# Patient Record
Sex: Female | Born: 1935 | Race: White | Hispanic: No | Smoking: Never smoker
Health system: Southern US, Community
[De-identification: ages and names within clinical notes are randomized; demographics above are authoritative.]

## PROBLEM LIST (undated history)

## (undated) DIAGNOSIS — N189 Chronic kidney disease, unspecified: Secondary | ICD-10-CM

## (undated) DIAGNOSIS — I495 Sick sinus syndrome: Secondary | ICD-10-CM

## (undated) DIAGNOSIS — C7800 Secondary malignant neoplasm of unspecified lung: Secondary | ICD-10-CM

## (undated) DIAGNOSIS — C7951 Secondary malignant neoplasm of bone: Secondary | ICD-10-CM

## (undated) DIAGNOSIS — M1990 Unspecified osteoarthritis, unspecified site: Secondary | ICD-10-CM

## (undated) DIAGNOSIS — K5730 Diverticulosis of large intestine without perforation or abscess without bleeding: Secondary | ICD-10-CM

## (undated) DIAGNOSIS — C7931 Secondary malignant neoplasm of brain: Secondary | ICD-10-CM

## (undated) DIAGNOSIS — M109 Gout, unspecified: Secondary | ICD-10-CM

## (undated) DIAGNOSIS — I10 Essential (primary) hypertension: Secondary | ICD-10-CM

## (undated) DIAGNOSIS — Z923 Personal history of irradiation: Secondary | ICD-10-CM

## (undated) DIAGNOSIS — R42 Dizziness and giddiness: Secondary | ICD-10-CM

## (undated) DIAGNOSIS — B9620 Unspecified Escherichia coli [E. coli] as the cause of diseases classified elsewhere: Secondary | ICD-10-CM

## (undated) DIAGNOSIS — Z7189 Other specified counseling: Secondary | ICD-10-CM

## (undated) DIAGNOSIS — N390 Urinary tract infection, site not specified: Secondary | ICD-10-CM

## (undated) DIAGNOSIS — E785 Hyperlipidemia, unspecified: Secondary | ICD-10-CM

## (undated) DIAGNOSIS — C649 Malignant neoplasm of unspecified kidney, except renal pelvis: Secondary | ICD-10-CM

## (undated) DIAGNOSIS — I2510 Atherosclerotic heart disease of native coronary artery without angina pectoris: Secondary | ICD-10-CM

## (undated) DIAGNOSIS — K635 Polyp of colon: Secondary | ICD-10-CM

## (undated) DIAGNOSIS — I219 Acute myocardial infarction, unspecified: Secondary | ICD-10-CM

## (undated) HISTORY — DX: Urinary tract infection, site not specified: N39.0

## (undated) HISTORY — DX: Hyperlipidemia, unspecified: E78.5

## (undated) HISTORY — DX: Other specified counseling: Z71.89

## (undated) HISTORY — PX: COLONOSCOPY: SHX174

## (undated) HISTORY — DX: Dizziness and giddiness: R42

## (undated) HISTORY — DX: Malignant neoplasm of unspecified kidney, except renal pelvis: C64.9

## (undated) HISTORY — DX: Secondary malignant neoplasm of bone: C79.51

## (undated) HISTORY — DX: Secondary malignant neoplasm of unspecified lung: C78.00

## (undated) HISTORY — PX: OOPHORECTOMY: SHX86

## (undated) HISTORY — DX: Atherosclerotic heart disease of native coronary artery without angina pectoris: I25.10

## (undated) HISTORY — DX: Unspecified Escherichia coli (E. coli) as the cause of diseases classified elsewhere: B96.20

## (undated) HISTORY — DX: Polyp of colon: K63.5

## (undated) HISTORY — PX: APPENDECTOMY: SHX54

## (undated) HISTORY — DX: Sick sinus syndrome: I49.5

## (undated) HISTORY — PX: BREAST BIOPSY: SHX20

## (undated) HISTORY — DX: Acute myocardial infarction, unspecified: I21.9

## (undated) HISTORY — PX: ABDOMINAL HYSTERECTOMY: SHX81

## (undated) HISTORY — DX: Chronic kidney disease, unspecified: N18.9

## (undated) HISTORY — DX: Unspecified osteoarthritis, unspecified site: M19.90

## (undated) HISTORY — PX: TONSILLECTOMY: SUR1361

## (undated) HISTORY — DX: Secondary malignant neoplasm of brain: C79.31

## (undated) HISTORY — PX: CHOLECYSTECTOMY: SHX55

## (undated) HISTORY — DX: Diverticulosis of large intestine without perforation or abscess without bleeding: K57.30

## (undated) HISTORY — DX: Gout, unspecified: M10.9

## (undated) HISTORY — DX: Essential (primary) hypertension: I10

---

## 1998-01-21 ENCOUNTER — Other Ambulatory Visit: Admission: RE | Admit: 1998-01-21 | Discharge: 1998-01-21 | Payer: Self-pay | Admitting: *Deleted

## 1999-01-17 ENCOUNTER — Other Ambulatory Visit: Admission: RE | Admit: 1999-01-17 | Discharge: 1999-01-17 | Payer: Self-pay | Admitting: *Deleted

## 1999-05-25 ENCOUNTER — Inpatient Hospital Stay (HOSPITAL_COMMUNITY): Admission: EM | Admit: 1999-05-25 | Discharge: 1999-05-27 | Payer: Self-pay | Admitting: Internal Medicine

## 1999-07-25 ENCOUNTER — Encounter (INDEPENDENT_AMBULATORY_CARE_PROVIDER_SITE_OTHER): Payer: Self-pay

## 1999-07-25 ENCOUNTER — Ambulatory Visit (HOSPITAL_COMMUNITY): Admission: RE | Admit: 1999-07-25 | Discharge: 1999-07-25 | Payer: Self-pay | Admitting: Internal Medicine

## 2000-03-12 ENCOUNTER — Ambulatory Visit (HOSPITAL_COMMUNITY): Admission: RE | Admit: 2000-03-12 | Discharge: 2000-03-12 | Payer: Self-pay | Admitting: Orthopedic Surgery

## 2000-04-22 ENCOUNTER — Encounter: Admission: RE | Admit: 2000-04-22 | Discharge: 2000-04-22 | Payer: Self-pay | Admitting: Internal Medicine

## 2000-04-22 ENCOUNTER — Encounter: Payer: Self-pay | Admitting: Internal Medicine

## 2001-03-11 ENCOUNTER — Emergency Department (HOSPITAL_COMMUNITY): Admission: EM | Admit: 2001-03-11 | Discharge: 2001-03-11 | Payer: Self-pay | Admitting: Emergency Medicine

## 2001-05-08 ENCOUNTER — Encounter: Payer: Self-pay | Admitting: Internal Medicine

## 2001-05-08 ENCOUNTER — Encounter: Admission: RE | Admit: 2001-05-08 | Discharge: 2001-05-08 | Payer: Self-pay | Admitting: Internal Medicine

## 2002-05-19 ENCOUNTER — Encounter: Payer: Self-pay | Admitting: Internal Medicine

## 2002-05-19 ENCOUNTER — Encounter: Admission: RE | Admit: 2002-05-19 | Discharge: 2002-05-19 | Payer: Self-pay | Admitting: *Deleted

## 2003-06-01 ENCOUNTER — Encounter: Admission: RE | Admit: 2003-06-01 | Discharge: 2003-06-01 | Payer: Self-pay | Admitting: Internal Medicine

## 2003-06-01 ENCOUNTER — Encounter: Payer: Self-pay | Admitting: Internal Medicine

## 2003-11-20 LAB — HM COLONOSCOPY

## 2004-02-22 ENCOUNTER — Other Ambulatory Visit: Admission: RE | Admit: 2004-02-22 | Discharge: 2004-02-22 | Payer: Self-pay | Admitting: *Deleted

## 2005-01-05 ENCOUNTER — Ambulatory Visit: Payer: Self-pay | Admitting: Internal Medicine

## 2005-01-30 ENCOUNTER — Ambulatory Visit: Payer: Self-pay | Admitting: Internal Medicine

## 2005-05-30 ENCOUNTER — Ambulatory Visit: Payer: Self-pay | Admitting: Internal Medicine

## 2005-12-18 ENCOUNTER — Ambulatory Visit: Payer: Self-pay | Admitting: Internal Medicine

## 2006-01-23 ENCOUNTER — Ambulatory Visit: Payer: Self-pay | Admitting: Internal Medicine

## 2006-01-25 ENCOUNTER — Ambulatory Visit: Payer: Self-pay | Admitting: Internal Medicine

## 2006-08-22 ENCOUNTER — Ambulatory Visit: Payer: Self-pay | Admitting: Internal Medicine

## 2006-09-03 ENCOUNTER — Encounter: Admission: RE | Admit: 2006-09-03 | Discharge: 2006-09-03 | Payer: Self-pay | Admitting: Internal Medicine

## 2006-11-15 ENCOUNTER — Ambulatory Visit: Payer: Self-pay | Admitting: Internal Medicine

## 2007-01-08 ENCOUNTER — Ambulatory Visit: Payer: Self-pay | Admitting: Internal Medicine

## 2007-01-08 LAB — CONVERTED CEMR LAB
AST: 20 units/L (ref 0–37)
Cholesterol: 150 mg/dL (ref 0–200)
Creatinine, Ser: 1.3 mg/dL — ABNORMAL HIGH (ref 0.4–1.2)
Potassium: 3.8 meq/L (ref 3.5–5.1)
Total CHOL/HDL Ratio: 3.4

## 2007-02-20 ENCOUNTER — Ambulatory Visit: Payer: Self-pay | Admitting: Internal Medicine

## 2007-07-01 ENCOUNTER — Ambulatory Visit: Payer: Self-pay | Admitting: Internal Medicine

## 2007-07-09 LAB — CONVERTED CEMR LAB
ALT: 17 units/L (ref 0–35)
Creatinine, Ser: 1.2 mg/dL (ref 0.4–1.2)
Direct LDL: 82.7 mg/dL
HDL: 36.4 mg/dL — ABNORMAL LOW (ref >39.0)
Potassium: 4.1 meq/L (ref 3.5–5.1)
Triglycerides: 210 mg/dL (ref 0–149)

## 2007-07-10 ENCOUNTER — Encounter (INDEPENDENT_AMBULATORY_CARE_PROVIDER_SITE_OTHER): Payer: Self-pay | Admitting: *Deleted

## 2007-07-11 ENCOUNTER — Ambulatory Visit: Payer: Self-pay | Admitting: Internal Medicine

## 2007-07-11 LAB — CONVERTED CEMR LAB: Cholesterol, target level: 200 mg/dL

## 2007-07-19 LAB — CONVERTED CEMR LAB
Basophils Absolute: 0 K/uL
Basophils Relative: 0.2 %
Eosinophils Absolute: 0.1 K/uL
Eosinophils Relative: 1.6 %
HCT: 39.1 %
Hemoglobin: 13.3 g/dL
Lymphocytes Relative: 22.3 %
MCHC: 34 g/dL
MCV: 86 fL
Monocytes Absolute: 0.4 K/uL
Monocytes Relative: 6.4 %
Neutro Abs: 4 K/uL
Neutrophils Relative %: 69.5 %
Platelets: 235 K/uL
RBC: 4.54 M/uL
RDW: 12.9 %
TSH: 2.47 u[IU]/mL
WBC: 5.8 10*3/microliter

## 2007-07-22 ENCOUNTER — Encounter (INDEPENDENT_AMBULATORY_CARE_PROVIDER_SITE_OTHER): Payer: Self-pay | Admitting: *Deleted

## 2007-09-05 ENCOUNTER — Encounter: Admission: RE | Admit: 2007-09-05 | Discharge: 2007-09-05 | Payer: Self-pay | Admitting: Internal Medicine

## 2007-11-20 HISTORY — PX: BREAST LUMPECTOMY: SHX2

## 2007-12-24 ENCOUNTER — Ambulatory Visit: Payer: Self-pay | Admitting: Internal Medicine

## 2007-12-24 DIAGNOSIS — I10 Essential (primary) hypertension: Secondary | ICD-10-CM | POA: Insufficient documentation

## 2007-12-24 DIAGNOSIS — N6019 Diffuse cystic mastopathy of unspecified breast: Secondary | ICD-10-CM | POA: Insufficient documentation

## 2007-12-24 DIAGNOSIS — E785 Hyperlipidemia, unspecified: Secondary | ICD-10-CM | POA: Insufficient documentation

## 2007-12-24 DIAGNOSIS — D696 Thrombocytopenia, unspecified: Secondary | ICD-10-CM | POA: Insufficient documentation

## 2007-12-29 ENCOUNTER — Encounter (INDEPENDENT_AMBULATORY_CARE_PROVIDER_SITE_OTHER): Payer: Self-pay | Admitting: *Deleted

## 2007-12-29 ENCOUNTER — Encounter: Admission: RE | Admit: 2007-12-29 | Discharge: 2007-12-29 | Payer: Self-pay | Admitting: Internal Medicine

## 2007-12-29 LAB — CONVERTED CEMR LAB
AST: 20 units/L (ref 0–37)
Albumin: 4.3 g/dL (ref 3.5–5.2)
Bilirubin, Direct: 0.2 mg/dL (ref 0.0–0.3)
Creatinine, Ser: 1.5 mg/dL — ABNORMAL HIGH (ref 0.4–1.2)
Eosinophils Absolute: 0.2 10*3/uL (ref 0.0–0.6)
Eosinophils Relative: 3.7 % (ref 0.0–5.0)
Lymphocytes Relative: 31.5 % (ref 12.0–46.0)
MCHC: 33.7 g/dL (ref 30.0–36.0)
Neutro Abs: 3 10*3/uL (ref 1.4–7.7)
Neutrophils Relative %: 59 % (ref 43.0–77.0)
Platelets: 215 10*3/uL (ref 150–400)
Potassium: 5.1 meq/L (ref 3.5–5.1)
Prolactin: 6.2 ng/mL
RBC: 4.57 M/uL (ref 3.87–5.11)
WBC: 5 10*3/uL (ref 4.5–10.5)

## 2007-12-31 ENCOUNTER — Encounter (INDEPENDENT_AMBULATORY_CARE_PROVIDER_SITE_OTHER): Payer: Self-pay | Admitting: *Deleted

## 2008-02-26 ENCOUNTER — Encounter: Payer: Self-pay | Admitting: Internal Medicine

## 2008-03-02 ENCOUNTER — Encounter: Payer: Self-pay | Admitting: Internal Medicine

## 2008-03-23 ENCOUNTER — Encounter: Admission: RE | Admit: 2008-03-23 | Discharge: 2008-03-23 | Payer: Self-pay | Admitting: Surgery

## 2008-03-25 ENCOUNTER — Ambulatory Visit (HOSPITAL_BASED_OUTPATIENT_CLINIC_OR_DEPARTMENT_OTHER): Admission: RE | Admit: 2008-03-25 | Discharge: 2008-03-25 | Payer: Self-pay | Admitting: Surgery

## 2008-03-25 ENCOUNTER — Encounter (INDEPENDENT_AMBULATORY_CARE_PROVIDER_SITE_OTHER): Payer: Self-pay | Admitting: Surgery

## 2008-03-30 ENCOUNTER — Encounter: Payer: Self-pay | Admitting: Internal Medicine

## 2008-04-08 ENCOUNTER — Encounter: Payer: Self-pay | Admitting: Internal Medicine

## 2008-06-30 ENCOUNTER — Ambulatory Visit: Payer: Self-pay | Admitting: Internal Medicine

## 2008-07-04 LAB — CONVERTED CEMR LAB: Creatinine, Ser: 1.3 mg/dL — ABNORMAL HIGH (ref 0.4–1.2)

## 2008-07-05 ENCOUNTER — Encounter (INDEPENDENT_AMBULATORY_CARE_PROVIDER_SITE_OTHER): Payer: Self-pay | Admitting: *Deleted

## 2008-07-06 ENCOUNTER — Telehealth (INDEPENDENT_AMBULATORY_CARE_PROVIDER_SITE_OTHER): Payer: Self-pay | Admitting: *Deleted

## 2008-07-08 ENCOUNTER — Encounter: Payer: Self-pay | Admitting: Internal Medicine

## 2008-07-14 ENCOUNTER — Encounter: Payer: Self-pay | Admitting: Internal Medicine

## 2008-07-14 ENCOUNTER — Ambulatory Visit: Payer: Self-pay | Admitting: Vascular Surgery

## 2008-07-16 ENCOUNTER — Encounter: Payer: Self-pay | Admitting: Internal Medicine

## 2008-07-23 ENCOUNTER — Encounter: Payer: Self-pay | Admitting: Internal Medicine

## 2008-09-16 ENCOUNTER — Encounter: Admission: RE | Admit: 2008-09-16 | Discharge: 2008-09-16 | Payer: Self-pay | Admitting: Internal Medicine

## 2008-12-30 ENCOUNTER — Ambulatory Visit: Payer: Self-pay | Admitting: Internal Medicine

## 2008-12-30 DIAGNOSIS — H8110 Benign paroxysmal vertigo, unspecified ear: Secondary | ICD-10-CM | POA: Insufficient documentation

## 2008-12-30 DIAGNOSIS — I498 Other specified cardiac arrhythmias: Secondary | ICD-10-CM

## 2008-12-30 DIAGNOSIS — K5730 Diverticulosis of large intestine without perforation or abscess without bleeding: Secondary | ICD-10-CM | POA: Insufficient documentation

## 2008-12-30 DIAGNOSIS — Z860100 Personal history of colon polyps, unspecified: Secondary | ICD-10-CM | POA: Insufficient documentation

## 2008-12-30 DIAGNOSIS — M1990 Unspecified osteoarthritis, unspecified site: Secondary | ICD-10-CM | POA: Insufficient documentation

## 2009-01-02 LAB — CONVERTED CEMR LAB
Alkaline Phosphatase: 71 units/L (ref 39–117)
BUN: 27 mg/dL — ABNORMAL HIGH (ref 6–23)
Bilirubin, Direct: 0.1 mg/dL (ref 0.0–0.3)
Chloride: 100 meq/L (ref 96–112)
Cholesterol: 176 mg/dL (ref 0–200)
Direct LDL: 94.8 mg/dL
Eosinophils Absolute: 0.1 10*3/uL (ref 0.0–0.7)
GFR calc non Af Amer: 38 mL/min
Glucose, Bld: 97 mg/dL (ref 70–99)
HDL: 38.2 mg/dL — ABNORMAL LOW (ref >39.0)
Lymphocytes Relative: 29.4 % (ref 12.0–46.0)
Monocytes Absolute: 0.3 10*3/uL (ref 0.1–1.0)
Monocytes Relative: 6.3 % (ref 3.0–12.0)
Neutro Abs: 2.6 10*3/uL (ref 1.4–7.7)
Neutrophils Relative %: 60.5 % (ref 43.0–77.0)
Potassium: 3.8 meq/L (ref 3.5–5.1)
RBC: 4.27 M/uL (ref 3.87–5.11)
TSH: 3.31 microintl units/mL (ref 0.35–5.50)
Total CHOL/HDL Ratio: 4.6
Triglycerides: 221 mg/dL (ref 0–149)
VLDL: 44 mg/dL — ABNORMAL HIGH (ref 0–40)
WBC: 4.3 10*3/uL — ABNORMAL LOW (ref 4.5–10.5)

## 2009-01-03 ENCOUNTER — Encounter (INDEPENDENT_AMBULATORY_CARE_PROVIDER_SITE_OTHER): Payer: Self-pay | Admitting: *Deleted

## 2009-01-05 ENCOUNTER — Encounter: Payer: Self-pay | Admitting: Internal Medicine

## 2009-01-18 ENCOUNTER — Encounter (INDEPENDENT_AMBULATORY_CARE_PROVIDER_SITE_OTHER): Payer: Self-pay | Admitting: *Deleted

## 2009-01-18 ENCOUNTER — Ambulatory Visit: Payer: Self-pay | Admitting: Internal Medicine

## 2009-01-18 LAB — CONVERTED CEMR LAB
OCCULT 1: NEGATIVE
OCCULT 3: NEGATIVE

## 2009-04-27 ENCOUNTER — Ambulatory Visit: Payer: Self-pay | Admitting: Internal Medicine

## 2009-05-03 ENCOUNTER — Encounter (INDEPENDENT_AMBULATORY_CARE_PROVIDER_SITE_OTHER): Payer: Self-pay | Admitting: *Deleted

## 2009-05-03 LAB — CONVERTED CEMR LAB
Direct LDL: 91.8 mg/dL
Potassium: 4.3 meq/L (ref 3.5–5.1)
VLDL: 51.6 mg/dL — ABNORMAL HIGH (ref 0.0–40.0)

## 2009-06-07 ENCOUNTER — Telehealth (INDEPENDENT_AMBULATORY_CARE_PROVIDER_SITE_OTHER): Payer: Self-pay | Admitting: *Deleted

## 2009-06-08 ENCOUNTER — Ambulatory Visit: Payer: Self-pay | Admitting: Internal Medicine

## 2009-06-08 DIAGNOSIS — N182 Chronic kidney disease, stage 2 (mild): Secondary | ICD-10-CM

## 2009-06-08 LAB — CONVERTED CEMR LAB
Bilirubin Urine: NEGATIVE
Blood in Urine, dipstick: NEGATIVE
Nitrite: NEGATIVE
Specific Gravity, Urine: <1.005
Urobilinogen, UA: 0.2
pH: 5

## 2009-06-09 ENCOUNTER — Encounter: Payer: Self-pay | Admitting: Internal Medicine

## 2009-06-10 ENCOUNTER — Telehealth (INDEPENDENT_AMBULATORY_CARE_PROVIDER_SITE_OTHER): Payer: Self-pay | Admitting: *Deleted

## 2009-06-14 ENCOUNTER — Telehealth (INDEPENDENT_AMBULATORY_CARE_PROVIDER_SITE_OTHER): Payer: Self-pay | Admitting: *Deleted

## 2009-07-05 ENCOUNTER — Encounter: Payer: Self-pay | Admitting: Internal Medicine

## 2009-10-07 ENCOUNTER — Ambulatory Visit: Payer: Self-pay | Admitting: Internal Medicine

## 2009-10-09 LAB — CONVERTED CEMR LAB
BUN: 25 mg/dL — ABNORMAL HIGH (ref 6–23)
Cholesterol: 228 mg/dL — ABNORMAL HIGH (ref 0–200)
Creatinine, Ser: 1.5 mg/dL — ABNORMAL HIGH (ref 0.4–1.2)
Direct LDL: 136.7 mg/dL
HDL: 34.9 mg/dL — ABNORMAL LOW (ref >39.00)
Potassium: 4.2 meq/L (ref 3.5–5.1)

## 2009-10-10 ENCOUNTER — Encounter (INDEPENDENT_AMBULATORY_CARE_PROVIDER_SITE_OTHER): Payer: Self-pay | Admitting: *Deleted

## 2009-10-18 ENCOUNTER — Telehealth (INDEPENDENT_AMBULATORY_CARE_PROVIDER_SITE_OTHER): Payer: Self-pay | Admitting: *Deleted

## 2009-10-18 ENCOUNTER — Ambulatory Visit: Payer: Self-pay | Admitting: Internal Medicine

## 2009-10-20 ENCOUNTER — Encounter: Admission: RE | Admit: 2009-10-20 | Discharge: 2009-10-20 | Payer: Self-pay | Admitting: Internal Medicine

## 2009-11-19 DIAGNOSIS — N189 Chronic kidney disease, unspecified: Secondary | ICD-10-CM

## 2009-11-19 HISTORY — PX: PACEMAKER INSERTION: SHX728

## 2009-11-19 HISTORY — DX: Chronic kidney disease, unspecified: N18.9

## 2009-11-22 ENCOUNTER — Ambulatory Visit: Payer: Self-pay | Admitting: Internal Medicine

## 2009-11-22 ENCOUNTER — Telehealth: Payer: Self-pay | Admitting: Internal Medicine

## 2009-11-22 DIAGNOSIS — R42 Dizziness and giddiness: Secondary | ICD-10-CM

## 2009-12-06 ENCOUNTER — Encounter: Payer: Self-pay | Admitting: Internal Medicine

## 2010-01-05 ENCOUNTER — Encounter: Payer: Self-pay | Admitting: Internal Medicine

## 2010-02-14 ENCOUNTER — Ambulatory Visit: Payer: Self-pay | Admitting: Internal Medicine

## 2010-03-28 ENCOUNTER — Telehealth (INDEPENDENT_AMBULATORY_CARE_PROVIDER_SITE_OTHER): Payer: Self-pay | Admitting: *Deleted

## 2010-04-04 ENCOUNTER — Telehealth (INDEPENDENT_AMBULATORY_CARE_PROVIDER_SITE_OTHER): Payer: Self-pay | Admitting: *Deleted

## 2010-04-06 ENCOUNTER — Encounter: Payer: Self-pay | Admitting: Internal Medicine

## 2010-04-19 HISTORY — PX: OTHER SURGICAL HISTORY: SHX169

## 2010-04-22 ENCOUNTER — Inpatient Hospital Stay (HOSPITAL_COMMUNITY): Admission: EM | Admit: 2010-04-22 | Discharge: 2010-04-26 | Payer: Self-pay | Admitting: Emergency Medicine

## 2010-04-22 ENCOUNTER — Ambulatory Visit: Payer: Self-pay | Admitting: Cardiovascular Disease

## 2010-04-24 ENCOUNTER — Encounter: Payer: Self-pay | Admitting: Cardiology

## 2010-06-27 ENCOUNTER — Ambulatory Visit: Payer: Self-pay | Admitting: Internal Medicine

## 2010-07-03 LAB — CONVERTED CEMR LAB
BUN: 33 mg/dL — ABNORMAL HIGH (ref 6–23)
Direct LDL: 82.7 mg/dL
Potassium: 4.1 meq/L (ref 3.5–5.1)

## 2010-07-04 ENCOUNTER — Ambulatory Visit: Payer: Self-pay | Admitting: Internal Medicine

## 2010-07-04 DIAGNOSIS — W57XXXA Bitten or stung by nonvenomous insect and other nonvenomous arthropods, initial encounter: Secondary | ICD-10-CM | POA: Insufficient documentation

## 2010-07-04 DIAGNOSIS — S90569A Insect bite (nonvenomous), unspecified ankle, initial encounter: Secondary | ICD-10-CM

## 2010-07-12 ENCOUNTER — Ambulatory Visit: Payer: Self-pay | Admitting: Cardiology

## 2010-10-06 ENCOUNTER — Ambulatory Visit: Payer: Self-pay | Admitting: Family Medicine

## 2010-10-06 DIAGNOSIS — R21 Rash and other nonspecific skin eruption: Secondary | ICD-10-CM

## 2010-10-17 ENCOUNTER — Telehealth: Payer: Self-pay | Admitting: Internal Medicine

## 2010-10-17 ENCOUNTER — Ambulatory Visit: Payer: Self-pay | Admitting: Cardiology

## 2010-10-17 DIAGNOSIS — M25559 Pain in unspecified hip: Secondary | ICD-10-CM

## 2010-11-19 DIAGNOSIS — I219 Acute myocardial infarction, unspecified: Secondary | ICD-10-CM

## 2010-11-19 HISTORY — DX: Acute myocardial infarction, unspecified: I21.9

## 2010-11-21 ENCOUNTER — Encounter
Admission: RE | Admit: 2010-11-21 | Discharge: 2010-11-21 | Payer: Self-pay | Source: Home / Self Care | Attending: Internal Medicine | Admitting: Internal Medicine

## 2010-12-19 NOTE — Assessment & Plan Note (Signed)
Summary: 4 mth fu/ns/kdc   Vital Signs:  Patient profile:   77 year old female Weight:      180.6 pounds Pulse rate:   60 / minute Resp:     17 per minute BP sitting:   110 / 62  (left arm) Cuff size:   large  Vitals Entered By: Shonna Chock (February 14, 2010 10:23 AM) CC: 4 Month Follow-up Comments REVIEWED MED LIST, PATIENT AGREED DOSE AND INSTRUCTION CORRECT    CC:  4 Month Follow-up.  History of Present Illness: Brooke Rios is asymptomatic; Dr Elvis Coil 11/2009  evaluation of her bradycardia reviewed. Labs reviewed ; "I love sweets".  Allergies: 1)  ! Codeine 2)  ! * Tiazac 3)  ! * Avapro 4)  ! Procardia 5)  ! Norvasc  Review of Systems Eyes:  Denies double vision and vision loss-both eyes. CV:  Denies lightheadness and near fainting. Neuro:  Complains of numbness; denies brief paralysis, tingling, and weakness; Occasional am numbness in extremities, ? positional.  Physical Exam  General:  well-nourished; alert,appropriate and cooperative throughout examination Lungs:  Normal respiratory effort, chest expands symmetrically. Lungs are clear to auscultation, no crackles or wheezes. Heart:  normal rate, regular rhythm, no gallop, no rub, no JVD, no HJR, and grade 1 /6 systolic murmur.   Abdomen:  Bowel sounds positive,abdomen soft and non-tender without masses, organomegaly or hernias noted. Dullness LUQ Pulses:  R and L carotid,radial,dorsalis pedis and posterior tibial pulses are full and equal bilaterally Extremities:  No clubbing, cyanosis, edema. Neurologic:  alert & oriented X3, strength normal in all extremities, and DTRs symmetrical  but 0-1/2 + Psych:  memory intact for recent and remote, normally interactive, and good eye contact.     Impression & Recommendations:  Problem # 1:  BRADYCARDIA, CHRONIC (ICD-427.89) asymptomatic  Problem # 2:  RENAL DISEASE, CHRONIC, MILD (ICD-585.2)  Problem # 3:  HYPERLIPIDEMIA (ICD-272.4)  Complete Medication List: 1)   Benazepril Hcl 40 Mg Tabs (Benazepril hcl) .... Take 1 by mouth everyday 2)  Hydrochlorothiazide 12.5 Mg Tabs (hydrochlorothiazide)  .Marland Kitchen.. 1 qd 3)  Fish Oil 1000 Mg Caps (Omega-3 fatty acids) .Marland Kitchen.. 1 by mouth once daily 4)  Miralax   Patient Instructions: 1)  Please schedule a follow-up appointment in 4 months. Consume < 40 grams of "sugar" from HFCS sources as discussed. 2)  BUN,creat,K+ prior to visit, ICD-9:401.9 3)  Lipid Panel prior to visit, ICD-9:277.7 4)  HbgA1C prior to visit, ICD-9:277.7

## 2010-12-19 NOTE — Assessment & Plan Note (Signed)
Summary: light head, elevated Bp//fd   Vital Signs:  Patient profile:   77 year old female Height:      66.5 inches Weight:      185 pounds O2 Sat:      97 % Temp:     98.7 degrees F oral Pulse rate:   48 / minute BP sitting:   130 / 68  (left arm)  Vitals Entered By: Jeremy Johann CMA (November 22, 2009 12:48 PM) CC: lightheaded, elevated BP Comments REVIEWED MED LIST, PATIENT AGREED DOSE AND INSTRUCTION CORRECT    CC:  lightheaded and elevated BP.  History of Present Illness: Lightheadedness , hot flash  with flushing & weak intermittently for 2-3 months, progressive since 11/12/2009. Severe spell 11/21/2009. BP has ranged from 158/59-209/68. Spells last 2 minutes ; no trigger . No associated chest pain,rhythm change, headache, abdominal pain or diarrhea. Dr Elvis Coil 06/2009 reviewed ; "asymptomatic bradycardia"  to be monitored annually.  Allergies: 1)  ! Codeine 2)  ! * Tiazac 3)  ! * Avapro 4)  ! Procardia 5)  ! Norvasc  Review of Systems General:  Denies chills, fever, and sweats. Eyes:  Denies blurring, double vision, and vision loss-both eyes. CV:  Denies chest pain or discomfort, palpitations, and shortness of breath with exertion. Derm:  Complains of changes in color of skin; denies lesion(s) and rash. Neuro:  Denies brief paralysis, disturbances in coordination, headaches, numbness, poor balance, sensation of room spinning, tingling, visual disturbances, and weakness.  Physical Exam  General:  well-nourished,in no acute distress; alert,appropriate and cooperative throughout examination Eyes:  No corneal or conjunctival inflammation noted. EOMI. Perrla. Field of  Vision grossly normal. Lungs:  Normal respiratory effort, chest expands symmetrically. Lungs are clear to auscultation, no crackles or wheezes. Heart:  regular rhythm, bradycardia, and grade1.5   /6 systolic murmur with neck radiation.   Neurologic:  alert & oriented X3, cranial nerves II-XII intact,  strength normal in all extremities, sensation intact to light touch, gait normal, DTRs symmetrical and normal, and Romberg negative except slight tremor RUE > LUE.   Skin:  Intact without suspicious lesions or rashes Cervical Nodes:  No lymphadenopathy noted Axillary Nodes:  No palpable lymphadenopathy Psych:  memory intact for recent and remote, normally interactive, and good eye contact.     Impression & Recommendations:  Problem # 1:  DIZZINESS (ICD-780.4)  Paroxysmal lightheadedness  Orders: EKG w/ Interpretation (93000) Cardiology Referral (Cardiology)  Problem # 2:  HYPERTENSION (ICD-401.9)  uncotrolled as per her cuff Her updated medication list for this problem includes:    Benazepril Hcl 40 Mg Tabs (Benazepril hcl) .Marland Kitchen... Take 1 by mouth everyday  Orders: EKG w/ Interpretation (93000) Cardiology Referral (Cardiology)  Problem # 3:  BRADYCARDIA, CHRONIC (ICD-427.89)  ? new ectopic P wave vs 12/30/2008; ? need for Event Recorder  Orders: EKG w/ Interpretation (93000) Cardiology Referral (Cardiology)  Complete Medication List: 1)  Benazepril Hcl 40 Mg Tabs (Benazepril hcl) .... Take 1 by mouth everyday 2)  Hydrochlorothiazide 12.5 Mg Tabs (hydrochlorothiazide)  .Marland Kitchen.. 1 qd 3)  Fish Oil 1000 Mg Caps (Omega-3 fatty acids) .Marland Kitchen.. 1 by mouth once daily 4)  Miralax   Patient Instructions: 1)  To ER if symptoms persist or progress.

## 2010-12-19 NOTE — Assessment & Plan Note (Signed)
Summary: WOUND ON BACK OF LEG/WALKIN HOPPER PT/KN   Vital Signs:  Patient profile:   77 year old female Weight:      186 pounds Temp:     97.6 degrees F oral BP sitting:   120 / 80  (left arm)  Vitals Entered By: Doristine Devoid CMA (October 06, 2010 1:47 PM) CC: L leg rash x1 month not healing and very itchy   History of Present Illness: 77 yo woman here today for L leg wound.  pt reports it was previously a mole and she 'scratched it off w/ her toenail' 1 month ago.  it then became red circumferentially around this area.  diameter was previously larger but has shrunk.  now area is terribly 'itchy'.  has been using Neosporin and [facility].  currently on Pradaxa.  Current Medications (verified): 1)  Benazepril Hcl 20 Mg Tabs (Benazepril Hcl) .Marland Kitchen.. 1 Tablet By Mouth Everyday 2)  Hydrochlorothiazide 12.5 Mg Tabs (Hydrochlorothiazide) .... Take One Tablet Daily 3)  Fish Oil 1000 Mg Caps (Omega-3 Fatty Acids) .Marland Kitchen.. 1 By Mouth Once Daily 4)  Miralax 5)  Imdur 30 Mg Xr24h-Tab (Isosorbide Mononitrate) .Marland Kitchen.. 1 Tablet By Mouth Once Daily 6)  Metoprolol Succinate 50 Mg Xr24h-Tab (Metoprolol Succinate) .Marland Kitchen.. 1 Tablet By Mouth Once Daily 7)  Nitroglycerin 0.4 Mg/hr Pt24 (Nitroglycerin) .... As Needed 8)  Simvastatin 40 Mg Tabs (Simvastatin) .Marland Kitchen.. 1 Tablet By Mouth Once Daily 9)  Tramadol Hcl 50 Mg Tabs (Tramadol Hcl) .Marland Kitchen.. 1 Tablet Every 6 Hours As Needed For Pain 10)  Miralax  Powd (Polyethylene Glycol 3350) .Marland KitchenMarland KitchenMarland Kitchen 17 Grams By Mouth Weekly As Needed 11)  Pradaxa 75 Mg Caps (Dabigatran Etexilate Mesylate) .... Per Cards  Allergies (verified): 1)  ! Codeine 2)  ! * Tiazac 3)  ! * Avapro 4)  ! Procardia 5)  ! Norvasc  Review of Systems      See HPI  Physical Exam  General:  well-nourished; alert,appropriate and cooperative throughout examination Skin:  4 cm diameter area of blanching erythema on L medial leg, just below tibial plateau.  + excoriations.  not indurated or warm to  touch.   Impression & Recommendations:  Problem # 1:  RASH-NONVESICULAR (ICD-782.1) Assessment New  unclear as to what started process on pt's leg but at this time appears to be red, irritated and pt reports very itchy.  start steroid cream.  advised her to stop neosporin as she may be reacting to this.  no evidence of infxn.  reviewed supportive care and red flags that should prompt return.  Pt expresses understanding and is in agreement w/ this plan. Her updated medication list for this problem includes:    Triamcinolone Acetonide 0.1 % Oint (Triamcinolone acetonide) .Marland Kitchen... Apply to affected area two times a day.  disp 1 large tube  Orders: Prescription Created Electronically [phone])  Complete Medication List: 1)  Benazepril Hcl 20 Mg Tabs (Benazepril hcl) .Marland Kitchen.. 1 tablet by mouth everyday 2)  Hydrochlorothiazide 12.5 Mg Tabs (Hydrochlorothiazide) .... Take one tablet daily 3)  Fish Oil 1000 Mg Caps (Omega-3 fatty acids) .Marland Kitchen.. 1 by mouth once daily 4)  Miralax  5)  Imdur 30 Mg Xr24h-tab (Isosorbide mononitrate) .Marland Kitchen.. 1 tablet by mouth once daily 6)  Metoprolol Succinate 50 Mg Xr24h-tab (Metoprolol succinate) .Marland Kitchen.. 1 tablet by mouth once daily 7)  Nitroglycerin 0.4 Mg/hr Pt24 (Nitroglycerin) .... As needed 8)  Simvastatin 40 Mg Tabs (Simvastatin) .Marland Kitchen.. 1 tablet by mouth once daily 9)  Tramadol Hcl 50 Mg  Tabs (Tramadol hcl) .Marland Kitchen.. 1 tablet every 6 hours as needed for pain 10)  Miralax Powd (Polyethylene glycol 3350) .Marland KitchenMarland KitchenMarland Kitchen 17 grams by mouth weekly as needed 11)  Pradaxa 75 Mg Caps (Dabigatran etexilate mesylate) .... Per cards 12)  Triamcinolone Acetonide 0.1 % Oint (Triamcinolone acetonide) .... Apply to affected area two times a day.  disp 1 large tube  Patient Instructions: 1)  This doesn't appear to be infected- rather it looks irritated from the itching 2)  Apply the steroid cream two times a day to help the itching and healing process 3)  Stop the Neosporin 4)  If the redness again  spreads, please call- we will then need to evaluate it for infection 5)  Call with any questions or concerns 6)  Happy Holidays! Prescriptions: TRIAMCINOLONE ACETONIDE 0.1 % OINT (TRIAMCINOLONE ACETONIDE) apply to affected area two times a day.  disp 1 large tube  #1 x 1   Entered and Authorized by:   Neena Rhymes MD   Signed by:   Neena Rhymes MD on 10/06/2010   Method used:   Electronically to        [facility] (retail)       [address]       [place], [place]  [address]       Ph: [phone] or [phone]       Fax: [phone]   RxID:   [id number]    Orders Added: 1)  Est. Patient Level III [27253] 2)  Prescription Created Electronically [phone]

## 2010-12-19 NOTE — Letter (Signed)
Summary: [facility]   Imported By: Lanelle Bal 04/19/2010 12:42:00  _____________________________________________________________________  External Attachment:    Type:   Image     Comment:   External Document

## 2010-12-19 NOTE — Consult Note (Signed)
Summary: [facility]   Imported By: Lanelle Bal 12/15/2009 16:20:23  _____________________________________________________________________  External Attachment:    Type:   Image     Comment:   External Document

## 2010-12-19 NOTE — Progress Notes (Signed)
Summary: Medication Concerns  Phone Note Call from Patient Call back at Home Phone [phone]   Caller: Patient Summary of Call: Message left on VM: Patient said she received her meds from [facility] and they say the same thing but they look different.   I called patient and informed her we have not changed her prescriptions and if the pills are different in size or color they pharmacy sometimes changes manufactors and she should call [facility] for futher follow-up. I gave the patient the number to [facility]./[facility]  Apr 04, 2010 11:51 AM

## 2010-12-19 NOTE — Progress Notes (Signed)
Summary: Refill Reuqest  Phone Note Refill Request Message from:  Pharmacy  Refills Requested: Medication #1:  BENAZEPRIL HCL 40 MG  TABS take 1 by mouth everyday  Medication #2:  HYDROCHLOROTHIAZIDE 12.5 MG  TABS (HYDROCHLOROTHIAZIDE) 1 qd [facility]   Method Requested: Fax to [facility] Initial call taken by: Shonna Chock,  Mar 28, 2010 1:51 PM    Prescriptions: HYDROCHLOROTHIAZIDE 12.5 MG  TABS (HYDROCHLOROTHIAZIDE) 1 qd  #90 x 2   Entered by:   Shonna Chock   Authorized by:   Marga Melnick MD   Signed by:   Shonna Chock on 03/28/2010   Method used:   Faxed to ...       [facility]* (mail-order)             ,          Ph: [phone]       Fax: [phone]   RxID:   [id number] BENAZEPRIL HCL 40 MG  TABS (BENAZEPRIL HCL) take 1 by mouth everyday  #90 Each x 2   Entered by:   Shonna Chock   Authorized by:   Marga Melnick MD   Signed by:   Shonna Chock on 03/28/2010   Method used:   Faxed to ...       [facility]* (mail-order)             ,          Ph: [phone]       Fax: [phone]   RxID:   [id number]

## 2010-12-19 NOTE — Procedures (Signed)
Summary: [facility]  [facility]   Imported By: Lanelle Bal 01/13/2010 13:15:00  _____________________________________________________________________  External Attachment:    Type:   Image     Comment:   External Document

## 2010-12-19 NOTE — Letter (Signed)
Summary: [facility]   Imported By: Lanelle Bal 01/11/2010 12:15:49  _____________________________________________________________________  External Attachment:    Type:   Image     Comment:   External Document

## 2010-12-19 NOTE — Progress Notes (Signed)
Summary: Referral request  Phone Note Call from Patient Call back at Home Phone [phone]   Summary of Call: Patient called noting that her hip problem continues. It was painful to sit in church on Sunday and she could hardly walk the rest of the day. Her cardiologist had recommended Dr. Kellie Simmering, but she was made aware by their office that she needs referral. Ok to enter referral? Initial call taken by: Lucious Groves CMA,  October 17, 2010 2:41 PM  Follow-up for Phone Call        see referral Follow-up by: Marga Melnick MD,  October 17, 2010 4:03 PM  Additional Follow-up for Phone Call Additional follow up Details #1::        PT APPT IS 11-06-2010 W/DR TRUSLOW & I HAVE INFORMED PT Magdalen Spatz [facility]  October 18, 2010 1:38 PM   New Problems: HIP PAIN (ICD-719.45)   New Problems: HIP PAIN (ICD-719.45)

## 2010-12-19 NOTE — Progress Notes (Signed)
Summary: lightheaded, hot flashes  Phone Note Call from Patient Call back at Home Phone [phone]   Caller: Patient Summary of Call: pt c/o becoming lightheaded then getting real hot xmonths gradually increasing to more frequently. pt states that BP was 165/65, 209/68, 183/60, 181/53 after each episode. pt denies any numbness or tingling chest pain or pressure. pt currently takes BENAZEPRIL HCL 40 MG and HYDROCHLOROTHIAZIDE 12.5 MG for BP   pt advised if symptoms change or persist that she needs to be seen at ED prior to appt, pt ok. pt has pending OV @ 12:15 today. pt uses [facility].....................Marland KitchenFelecia Deloach CMA  November 22, 2009 8:33 AM   Follow-up for Phone Call        OV Follow-up by: Marga Melnick MD,  November 22, 2009 1:23 PM

## 2010-12-19 NOTE — Assessment & Plan Note (Signed)
Summary: 4 month roa//lch   Vital Signs:  Patient profile:   77 year old female Height:      66.5 inches (168.91 cm) Weight:      182.25 pounds (82.84 kg) BMI:     29.08 Temp:     97.7 degrees F (36.50 degrees C) oral Pulse rate:   60 / minute Resp:     17 per minute BP sitting:   122 / 80  (left arm) Cuff size:   large  Vitals Entered By: Brenton Grills MA (July 04, 2010 10:19 AM) CC: 4 month ROV/aj   CC:  4 month ROV/aj.  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 The  patient reports urinary frequency, but denies lightheadedness, headaches, and fatigue.  The patient denies the following associated symptoms: chest pain, chest pressure, exercise intolerance, dyspnea, palpitations, syncope, leg edema, and pedal edema.  Compliance with medications (by patient report) has been near 100%.  The patient reports that dietary compliance has been good.  The patient reports no exercise.  Adjunctive measures currently used by the patient include salt restriction.  BP @ home ranges 110/62- 167/95 ( average 139/79 = average of 2 extremmes).  Hyperlipidemia Follow-Up      The patient also presents for Hyperlipidemia follow-up.  The patient reports constipation, but denies muscle aches, GI upset, abdominal pain, flushing, itching, and diarrhea.  Compliance with medications (by patient report) has been near 100%.  Adjunctive measures currently used by the patient include ASA and fish oil supplements.   Simvastatin 40 mg at bedtime Rxed @ D/C  from hospital 06/08. Labs were done 08/09. Triglycerides (TG) 267; LDL 82.7; & A1c 5.8%. Role of High Fructose Corn Syrup in raising TG discussed.  Current Medications (verified): 1)  Benazepril Hcl 20 Mg Tabs (Benazepril Hcl) .Marland Kitchen.. 1 Tablet By Mouth Everyday 2)  Hydrochlorothiazide 12.5 Mg  Tabs (Hydrochlorothiazide) .Marland Kitchen.. 1 Qd 3)  Fish Oil 1000 Mg Caps (Omega-3 Fatty  Acids) .Marland Kitchen.. 1 By Mouth Once Daily 4)  Miralax 5)  Imdur 30 Mg Xr24h-Tab (Isosorbide Mononitrate) .Marland Kitchen.. 1 Tablet By Mouth Once Daily 6)  Metoprolol Succinate 50 Mg Xr24h-Tab (Metoprolol Succinate) .Marland Kitchen.. 1 Tablet By Mouth Once Daily 7)  Nitroglycerin 0.4 Mg/hr Pt24 (Nitroglycerin) .... As Needed 8)  Simvastatin 40 Mg Tabs (Simvastatin) .Marland Kitchen.. 1 Tablet By Mouth Once Daily 9)  Tramadol Hcl 50 Mg Tabs (Tramadol Hcl) .Marland Kitchen.. 1 Tablet Every 6 Hours As Needed For Pain 10)  Miralax  Powd (Polyethylene Glycol 3350) .Marland KitchenMarland KitchenMarland Kitchen 17 Grams By Mouth Weekly As Needed  Allergies (verified): 1)  ! Codeine 2)  ! * Tiazac 3)  ! * Avapro 4)  ! Procardia 5)  ! Norvasc  Past History:  Past Surgical History: Cholecystectomy age 65 Hysterectomy & Oophorectomy bilat for fibroids @ age 19, anemia pre op  due to dysfunctional menses yo Tonsillectomy Breast biopsy X1 Appendectomy  done  @  [facility] Lumpectomy 2009, Dr Jamey Ripa Colonoscopy ,last 2005,Dr Jarold Motto: Tics only; PAF  induced MI , S/P pacer for [facility] - tachy Syndrome 04/2010, Dr [place]  Review of Systems General:  Denies chills, fever, and sweats. Derm:  Complains of changes in color of skin; denies itching; Swelling, pain  & redness  lateral dorsal foot since ? bite 08/11. Improving w/o meds.  Physical Exam  General:  well-nourished; alert,appropriate and cooperative throughout examination Neck:  No deformities, masses, or tenderness noted. Lungs:  Normal  respiratory effort, chest expands symmetrically. Lungs are clear to auscultation, no crackles or wheezes. Heart:  normal rate, regular rhythm, no gallop, no rub, and no JVD.   Pulses:  R and L carotid,radial,dorsalis pedis and posterior tibial pulses are full and equal bilaterally Extremities:  No clubbing, cyanosis, edema. Minimal DIP OA changes Neurologic:  alert & oriented X3 and DTRs symmetrical and 1/2+ Skin:  Intact without suspicious lesions or rashes. No clinical cellulitis ; slight tenderness to  palpation Psych:  memory intact for recent and remote, normally interactive, and good eye contact.     Impression & Recommendations:  Problem # 1:  RENAL DISEASE, CHRONIC, MILD (ICD-585.2) stable  Problem # 2:  HYPERTENSION (ICD-401.9) repeat BP 120/80; ? validity of home cuff Her updated medication list for this problem includes:    Benazepril Hcl 20 Mg Tabs (Benazepril hcl) .Marland Kitchen... 1 tablet by mouth everyday    Metoprolol Succinate 50 Mg Xr24h-tab (Metoprolol succinate) .Marland Kitchen... 1 tablet by mouth once daily  Problem # 3:  HYPERLIPIDEMIA (ICD-272.4) TG not @ goal Her updated medication list for this problem includes:    Simvastatin 40 Mg Tabs (Simvastatin) .Marland Kitchen... 1 tablet by mouth once daily  Problem # 4:  INSECT BITE, LEG (ICD-916.4)  Complete Medication List: 1)  Benazepril Hcl 20 Mg Tabs (Benazepril hcl) .Marland Kitchen.. 1 tablet by mouth everyday 2)  Hydrochlorothiazide 12.5 Mg Tabs (hydrochlorothiazide)  .Marland Kitchen.. 1 qd 3)  Fish Oil 1000 Mg Caps (Omega-3 fatty acids) .Marland Kitchen.. 1 by mouth once daily 4)  Miralax  5)  Imdur 30 Mg Xr24h-tab (Isosorbide mononitrate) .Marland Kitchen.. 1 tablet by mouth once daily 6)  Metoprolol Succinate 50 Mg Xr24h-tab (Metoprolol succinate) .Marland Kitchen.. 1 tablet by mouth once daily 7)  Nitroglycerin 0.4 Mg/hr Pt24 (Nitroglycerin) .... As needed 8)  Simvastatin 40 Mg Tabs (Simvastatin) .Marland Kitchen.. 1 tablet by mouth once daily 9)  Tramadol Hcl 50 Mg Tabs (Tramadol hcl) .Marland Kitchen.. 1 tablet every 6 hours as needed for pain 10)  Miralax Powd (Polyethylene glycol 3350) .Marland KitchenMarland KitchenMarland Kitchen 17 grams by mouth weekly as needed  Patient Instructions: 1)  Consume LESS THAN 30 grams of sugar / day from High Fructose Corn Syrup  sources. Report red streaks up leg or fever/ chills. Epsom salts soaks & elevation 2X / day  if foot swollen. 2)  Please schedule a follow-up appointment in 4 months. 3)  Lipid Panel prior to visit, ICD-9:277.7

## 2011-02-05 LAB — BASIC METABOLIC PANEL
BUN: 26 mg/dL — ABNORMAL HIGH (ref 6–23)
BUN: 27 mg/dL — ABNORMAL HIGH (ref 6–23)
CO2: 25 mEq/L (ref 19–32)
CO2: 27 mEq/L (ref 19–32)
CO2: 28 mEq/L (ref 19–32)
Calcium: 8.8 mg/dL (ref 8.4–10.5)
Calcium: 9 mg/dL (ref 8.4–10.5)
Calcium: 9.2 mg/dL (ref 8.4–10.5)
Calcium: 9.2 mg/dL (ref 8.4–10.5)
Chloride: 101 mEq/L (ref 96–112)
Chloride: 103 mEq/L (ref 96–112)
Creatinine, Ser: 1.33 mg/dL — ABNORMAL HIGH (ref 0.4–1.2)
Creatinine, Ser: 1.43 mg/dL — ABNORMAL HIGH (ref 0.4–1.2)
Creatinine, Ser: 1.48 mg/dL — ABNORMAL HIGH (ref 0.4–1.2)
Creatinine, Ser: 1.51 mg/dL — ABNORMAL HIGH (ref 0.4–1.2)
GFR calc Af Amer: 41 mL/min — ABNORMAL LOW (ref >60)
GFR calc Af Amer: 41 mL/min — ABNORMAL LOW (ref >60)
GFR calc non Af Amer: 34 mL/min — ABNORMAL LOW (ref >60)
Glucose, Bld: 106 mg/dL — ABNORMAL HIGH (ref 70–99)
Glucose, Bld: 149 mg/dL — ABNORMAL HIGH (ref 70–99)
Glucose, Bld: 90 mg/dL (ref 70–99)
Glucose, Bld: 94 mg/dL (ref 70–99)
Potassium: 4.3 mEq/L (ref 3.5–5.1)
Sodium: 136 mEq/L (ref 135–145)
Sodium: 136 mEq/L (ref 135–145)
Sodium: 139 mEq/L (ref 135–145)

## 2011-02-05 LAB — DIFFERENTIAL
Eosinophils Absolute: 0.1 10*3/uL (ref 0.0–0.7)
Monocytes Relative: 6 % (ref 3–12)
Neutro Abs: 4.1 10*3/uL (ref 1.7–7.7)
Neutrophils Relative %: 67 % (ref 43–77)

## 2011-02-05 LAB — TROPONIN I: Troponin I: 0.04 ng/mL (ref 0.00–0.06)

## 2011-02-05 LAB — POCT CARDIAC MARKERS
Myoglobin, poc: 69.8 ng/mL (ref 12–200)
Troponin i, poc: <0.05 ng/mL (ref 0.00–0.09)

## 2011-02-05 LAB — CBC
HCT: 33.8 % — ABNORMAL LOW (ref 36.0–46.0)
HCT: 33.9 % — ABNORMAL LOW (ref 36.0–46.0)
Hemoglobin: 11.3 g/dL — ABNORMAL LOW (ref 12.0–15.0)
Hemoglobin: 11.9 g/dL — ABNORMAL LOW (ref 12.0–15.0)
MCHC: 33.3 g/dL (ref 30.0–36.0)
MCHC: 35 g/dL (ref 30.0–36.0)
MCV: 85.2 fL (ref 78.0–100.0)
MCV: 85.5 fL (ref 78.0–100.0)
MCV: 86.2 fL (ref 78.0–100.0)
Platelets: 183 10*3/uL (ref 150–400)
Platelets: 195 10*3/uL (ref 150–400)
RBC: 3.93 MIL/uL (ref 3.87–5.11)
RDW: 14.1 % (ref 11.5–15.5)
RDW: 14.1 % (ref 11.5–15.5)
RDW: 14.4 % (ref 11.5–15.5)
WBC: 6.3 10*3/uL (ref 4.0–10.5)

## 2011-02-05 LAB — PROTIME-INR: INR: 0.97 (ref 0.00–1.49)

## 2011-02-05 LAB — CK TOTAL AND CKMB (NOT AT ARMC)
CK, MB: 1.1 ng/mL (ref 0.3–4.0)
Total CK: 58 U/L (ref 7–177)

## 2011-02-05 LAB — CARDIAC PANEL(CRET KIN+CKTOT+MB+TROPI)
Relative Index: INVALID (ref 0.0–2.5)
Total CK: 67 U/L (ref 7–177)

## 2011-02-05 LAB — LIPID PANEL
Cholesterol: 209 mg/dL — ABNORMAL HIGH (ref 0–200)
HDL: 41 mg/dL (ref >39)

## 2011-02-05 LAB — APTT: aPTT: 28 seconds (ref 24–37)

## 2011-02-07 ENCOUNTER — Encounter (INDEPENDENT_AMBULATORY_CARE_PROVIDER_SITE_OTHER): Payer: Self-pay | Admitting: *Deleted

## 2011-02-15 NOTE — Letter (Signed)
Summary: Appointment - Reschedule  [facility], Main Office  [address]   [place], [place] [address]   Phone: [phone]  Fax: [phone]     February 07, 2011 MRN: [id number]   [facility] [address] [place], [place]  [address]   Dear Ms. Millican,   Due to a change in our office schedule, your appointment on 02-28-11  at 9:00am               must be changed.  It is very important that we reach you to reschedule this appointment. We look forward to participating in your health care needs. Please contact us at the number listed above at your earliest convenience to reschedule this appointment.     Sincerely,  Glass blower/designer

## 2011-02-28 ENCOUNTER — Encounter: Payer: Self-pay | Admitting: *Deleted

## 2011-03-01 ENCOUNTER — Encounter: Payer: Self-pay | Admitting: *Deleted

## 2011-03-01 ENCOUNTER — Ambulatory Visit (INDEPENDENT_AMBULATORY_CARE_PROVIDER_SITE_OTHER): Payer: Medicare Other | Admitting: *Deleted

## 2011-03-01 DIAGNOSIS — I498 Other specified cardiac arrhythmias: Secondary | ICD-10-CM

## 2011-04-03 NOTE — Op Note (Signed)
Brooke Rios, Brooke Rios                ACCOUNT NO.:  [id number]   MEDICAL RECORD NO.:  [id number]          PATIENT TYPE:  AMB   LOCATION:  DSC                          FACILITY:  [facility]   PHYSICIAN:  Currie Paris, M.D.DATE OF BIRTH:  11/17/1925   DATE OF PROCEDURE:  DATE OF DISCHARGE:                               OPERATIVE REPORT   PREOPERATIVE DIAGNOSIS:  Small left breast subareolar mass with bloody  nipple discharge.   CLINICAL HISTORY:  Ms. Oleary is an 77 year old lady with intermittent  left nipple discharge, which could not be elicited when she had a  mammogram, but she had dilated ducts and a small nodular density  subareolarly in the inferior or 6 to 6:30 position between the nipple  proper and the areolar margin.  After discussion with the patient, she  elected to have an incisional biopsy.   DESCRIPTION OF PROCEDURE:  The patient was seen in the holding area, and  she had no further questions.  We both identified and marked the left  breast as the operative site.   The patient was taken to the operating room.  After satisfactory general  anesthesia had been obtained, the left breast was prepped and draped.  The nodule was readily palpable and I marked the skin overlying that.  With general compression, I was able to elicit a little bloody nipple  discharge from a centrally located nipple duct and I was able to  initially probe this with a probe and dilated a little bit and then  injected a little methylene blue into it using an Angiocath.   I did make a circumlinear incision from about the 9 o'clock to about the  4 o'clock position.  There was a nodule just underneath the skin, which  I included in the excision but elevated the skin at the areola over to  the nipple and then disconnected ductal tissue from the nipple.  I  identified the blue duct and it seemed to go somewhat superolaterally  and deep.  I excised as much of the tissue as I could trying to get all  the blue stained tissue out until I got into fatty tissue with a distal  duct streaming out.  There were multiple dilated ducts, all of which had  a thick mucous like fluid in it.  Except for the palpable nodules that  were initially noted, no other nodular densities were palpable.   Once I had this tissue out, I spent some time making sure everything was  dry.  I put 0.25% plain Marcaine in to help with postop analgesia.  I  then closed some of the deeper tissues with 3-0 Vicryl followed by 4-0  Monocryl subcuticular and Dermabond on the skin.   The patient tolerated the procedure well and there were operative  complications.  All counts were correct.      Currie Paris, M.D.  Electronically Signed    CJS/MEDQ  D:  03/25/2008  T:  03/26/2008  Job:  [id number]   cc:   Gretta Cool, M.D.  Titus Dubin. Alwyn Ren, MD,FACP,FCCP

## 2011-04-03 NOTE — Procedures (Signed)
CAROTID DUPLEX EXAM   INDICATION:  Several episodes of vertigo.   HISTORY:  Diabetes:  No.  Cardiac:  Murmur, bradycardia.  Hypertension:  Yes.  Smoking:  No.  Previous Surgery:  No.  CV History:  The patient had two episodes of vertigo in 2002 and two  more episodes within the last month.  Amaurosis Fugax No, Paresthesias No, Hemiparesis No                                       RIGHT             LEFT  Brachial systolic pressure:         154               150  Brachial Doppler waveforms:         Triphasic         Triphasic  Vertebral direction of flow:        Antegrade         Antegrade  DUPLEX VELOCITIES (cm/sec)  CCA peak systolic                   54                54  ECA peak systolic                   65                73  ICA peak systolic                   44                59  ICA end diastolic                   11                19  PLAQUE MORPHOLOGY:                  Mixed             Soft  PLAQUE AMOUNT:                      Mild              Minimal  PLAQUE LOCATION:                    Proximal ICA      Proximal ICA   IMPRESSION:  20-39% ICA stenosis bilaterally.   ___________________________________________  Larina Earthly, M.D.   MC/MEDQ  D:  07/14/2008  T:  07/14/2008  Job:  [id number]

## 2011-04-30 ENCOUNTER — Other Ambulatory Visit: Payer: Self-pay | Admitting: Internal Medicine

## 2011-04-30 DIAGNOSIS — E8881 Metabolic syndrome: Secondary | ICD-10-CM

## 2011-04-30 DIAGNOSIS — E785 Hyperlipidemia, unspecified: Secondary | ICD-10-CM

## 2011-05-01 ENCOUNTER — Other Ambulatory Visit (INDEPENDENT_AMBULATORY_CARE_PROVIDER_SITE_OTHER): Payer: Medicare Other

## 2011-05-01 DIAGNOSIS — E785 Hyperlipidemia, unspecified: Secondary | ICD-10-CM

## 2011-05-01 DIAGNOSIS — E8881 Metabolic syndrome: Secondary | ICD-10-CM

## 2011-05-01 LAB — LIPID PANEL: Total CHOL/HDL Ratio: 6

## 2011-05-01 LAB — LDL CHOLESTEROL, DIRECT: Direct LDL: 137.4 mg/dL

## 2011-05-01 NOTE — Progress Notes (Signed)
Labs only

## 2011-05-09 ENCOUNTER — Emergency Department (HOSPITAL_COMMUNITY): Payer: Medicare Other

## 2011-05-09 ENCOUNTER — Emergency Department (HOSPITAL_COMMUNITY)
Admission: EM | Admit: 2011-05-09 | Discharge: 2011-05-09 | Disposition: A | Discharge status: Home / Self Care | Payer: Medicare Other | Attending: Emergency Medicine | Admitting: Emergency Medicine

## 2011-05-09 DIAGNOSIS — Z950 Presence of cardiac pacemaker: Secondary | ICD-10-CM | POA: Insufficient documentation

## 2011-05-09 DIAGNOSIS — I10 Essential (primary) hypertension: Secondary | ICD-10-CM | POA: Insufficient documentation

## 2011-05-09 DIAGNOSIS — J3489 Other specified disorders of nose and nasal sinuses: Secondary | ICD-10-CM | POA: Insufficient documentation

## 2011-05-09 DIAGNOSIS — I252 Old myocardial infarction: Secondary | ICD-10-CM | POA: Insufficient documentation

## 2011-05-09 DIAGNOSIS — R112 Nausea with vomiting, unspecified: Secondary | ICD-10-CM | POA: Insufficient documentation

## 2011-05-09 DIAGNOSIS — R42 Dizziness and giddiness: Secondary | ICD-10-CM | POA: Insufficient documentation

## 2011-05-09 LAB — CBC
HCT: 38.3 % (ref 36.0–46.0)
MCV: 84.7 fL (ref 78.0–100.0)
Platelets: 128 10*3/uL — ABNORMAL LOW (ref 150–400)
RBC: 4.52 MIL/uL (ref 3.87–5.11)
RDW: 13.5 % (ref 11.5–15.5)
WBC: 5.3 10*3/uL (ref 4.0–10.5)

## 2011-05-09 LAB — COMPREHENSIVE METABOLIC PANEL
AST: 19 U/L (ref 0–37)
Albumin: 3.6 g/dL (ref 3.5–5.2)
BUN: 28 mg/dL — ABNORMAL HIGH (ref 6–23)
Chloride: 99 mEq/L (ref 96–112)
Creatinine, Ser: 1.32 mg/dL — ABNORMAL HIGH (ref 0.50–1.10)
Potassium: 3.6 mEq/L (ref 3.5–5.1)
Total Protein: 6.2 g/dL (ref 6.0–8.3)

## 2011-05-09 LAB — DIFFERENTIAL
Basophils Absolute: 0 10*3/uL (ref 0.0–0.1)
Eosinophils Relative: 2 % (ref 0–5)
Lymphocytes Relative: 27 % (ref 12–46)
Lymphs Abs: 1.4 10*3/uL (ref 0.7–4.0)
Neutro Abs: 3.5 10*3/uL (ref 1.7–7.7)
Neutrophils Relative %: 66 % (ref 43–77)

## 2011-05-09 LAB — URINE MICROSCOPIC-ADD ON

## 2011-05-09 LAB — CK TOTAL AND CKMB (NOT AT ARMC)
CK, MB: 1.7 ng/mL (ref 0.3–4.0)
Relative Index: 1.3 (ref 0.0–2.5)

## 2011-05-09 LAB — URINALYSIS, ROUTINE W REFLEX MICROSCOPIC
Glucose, UA: NEGATIVE mg/dL
pH: 7 (ref 5.0–8.0)

## 2011-05-09 LAB — TROPONIN I: Troponin I: <0.3 ng/mL (ref <0.30)

## 2011-05-10 ENCOUNTER — Encounter: Payer: Self-pay | Admitting: Internal Medicine

## 2011-05-10 LAB — URINE CULTURE: Colony Count: 35000

## 2011-05-11 ENCOUNTER — Encounter: Payer: Self-pay | Admitting: Internal Medicine

## 2011-05-11 ENCOUNTER — Ambulatory Visit (INDEPENDENT_AMBULATORY_CARE_PROVIDER_SITE_OTHER): Payer: Medicare Other | Admitting: Internal Medicine

## 2011-05-11 DIAGNOSIS — I480 Paroxysmal atrial fibrillation: Secondary | ICD-10-CM

## 2011-05-11 DIAGNOSIS — I10 Essential (primary) hypertension: Secondary | ICD-10-CM

## 2011-05-11 DIAGNOSIS — H8110 Benign paroxysmal vertigo, unspecified ear: Secondary | ICD-10-CM

## 2011-05-11 DIAGNOSIS — I4891 Unspecified atrial fibrillation: Secondary | ICD-10-CM

## 2011-05-11 DIAGNOSIS — N182 Chronic kidney disease, stage 2 (mild): Secondary | ICD-10-CM

## 2011-05-11 DIAGNOSIS — E785 Hyperlipidemia, unspecified: Secondary | ICD-10-CM

## 2011-05-11 NOTE — Patient Instructions (Addendum)
Use Meclizine every 6-8 hrs as needed. ENT referral if Vertigo persists or progresses.  The most common cause of elevated triglycerides is the ingestion of sugar from high fructose corn syrup sources. You should consume less than 40 grams  of sugar per day from foods and drinks with high fructose corn syrup as number 2, 3, or #4 on the label. As TG go up, HDL or good cholesterol goes down. Also uric acid which causes gout will go up.

## 2011-05-11 NOTE — Progress Notes (Signed)
  Subjective:    Patient ID: Brooke Rios, female    DOB: 02-Aug-1925, 77 y.o.   MRN: [id number]  HPI Dizziness Onset:6/20 @ 8 am when she stood up; she had been reading paper for an hour Context: Position change:yes Benign positional vertigo symptoms:no Straining:no Pain:no Cardiac prodrome: Palpitations, irregular rhythm, heart rate change:no but BP was 180/? Neurologic prodrome: headache, numbness and tingling, weakness, change in coordination (gait/falling):no Syncope:no Seizure activity:no Upper respiratory tract infection/extrinsic symptoms:no  Duration:hours Frequency: Associated signs and symptoms: Nausea /vomiting : 4-5X Visual change (blurred/double/loss):no but true vertigo Hearing loss/tinnitus:minimal "buzzing" pre event Sweating:yes Chest pain:no Dyspnea:no Treatment/response:ER treated her with Meclizine & anti nausea med with benefit. ER MD stated "fluid L ear" BMW:[id number] otitis as child Labs :stable to improved renal impairment      Review of Systems pacer checked in past 6 months; "atrial fibrillation 1/3 of time"; Pradaxa Rxed by Dr [place]     Objective:   Physical Exam  Gen. appearance: Well-nourished, in no distress Eyes: Extraocular motion intact, field of vision normal, vision grossly intact, no nystagmus ENT: Canals clear, tympanic membranes scarred , L>R tuning fork exam: lateralizing to R; AC> BC bilaterally, hearing grossly normal to whisper Neck: Normal range of motion,  normal thyroid, large lipoma R posterior neck Cardiovascular: Rate and rhythm normal; no murmurs, gallops or extra heart sounds. S4 with slurring Muscle skeletal: Range of motion, tone, &  strength normal. Minor DIP OA changes Neuro:no cranial nerve deficit, deep tendon  reflexes normal, gait slow & deliberate, finger to nose normal  All pulses intact  without  bruits .No ischemic skin changes.   Lymph: No cervical or axillary LA Skin: Warm and dry without suspicious lesions  or rashes Psych: no anxiety or mood change. Normally interactive and cooperative.         Assessment & Plan:  #1 dizziness due to inner ear issues #2 PAF #3 see Problem List & assessments;see Recommendations Plan: Meclizine prn Note: Handicapped Placard declined

## 2011-05-20 NOTE — Group Therapy Note (Signed)
NAMETRENIKA, HUDSON NO.:  [id number]  MEDICAL RECORD NO.:  [id number]  LOCATION:  WLED                         FACILITY:  [facility]  PHYSICIAN:  Kingsley Callander. Ouida Sills, MD       DATE OF BIRTH:  [birth year]  DATE OF PROCEDURE: DATE OF DISCHARGE:  05/09/2011                                PROGRESS NOTE   Ms. Weiss was inadvertently admitted to my service last night by the hospitalist.  She presented to the emergency room with fevers and rigors.  She had a temperature of 102.6.  The etiology of her fever was not entirely clear last night, although she did have an area of opacity in her right upper lung, possibly consistent with a developing pneumonia and she also had 3-6 WBCs with rare bacteria on urinalysis.  She admits to having mild cough but no purulent sputum production.  She denies dysuria.  She has not had vomiting, diarrhea, abdominal pain, or any significant purulent sputum production.  Her white count was 11 last night with 80 segs and 6 lymphs.  Her fever is better this morning.  PHYSICAL EXAMINATION:  VITAL SIGNS:  Her temperature is 99.9, pulse 76, respirations 18, blood pressure 109/58, oxygen saturation 95% on room air. LUNGS:  Clear. HEART:  Regular. ABDOMEN:  Soft and nontender. EXTREMITIES:  Reveal no edema. NEURO:  Is at baseline.  IMPRESSION/PLAN: 1. Possible pneumonia.  Blood cultures have been obtained.  A urine     culture has been obtained.  She has been started on IV Rocephin     which will be continued.  Her white count this morning is 9.7. 2. Anemia.  Her hemoglobin is down to 7.6 after it had been 8.6     yesterday.  She has a history of recurrent anemia due to     gastrointestinal bleeding from arteriovenous malformations.  She     will be typed and crossed and transfused with 2 units of packed red     cells. 3. Chronic kidney disease.  BUN and creatinine are up to 51 and 2.1     today.  Her BUN and creatinine had been 29 and 1.5 at the  time of     her hospital discharge last month.  Last night her BUN and     creatinine were 49 and 2.4.  She has been on Lasix after having     diastolic heart failure during her last hospitalization.  She has     an ejection fraction of 55-60% and no wall motion abnormalities on     her echo from January 2011.  She had grade 1 diastolic dysfunction     at that time. 4. Dementia.  Continue Aricept. 5. History of lung cancer.  No evidence of recurrence. 6. Carotid artery disease.  Continue aspirin. 7. History of syncope and a 5.8-second asystole in May.  Lopressor has     been discontinued.  Hypertension has     been treated with amlodipine. 8. History of supraventricular tachycardia status post ablation. 9. History of depression.  Continue sertraline.     Kingsley Callander. Ouida Sills, MD     ROF/MEDQ  D:  05/16/2011  T:  05/16/2011  Job:  [id number]  Electronically Signed by Carylon Perches MD on 05/20/2011 10:22:35 AM

## 2011-05-25 ENCOUNTER — Telehealth: Payer: Self-pay | Admitting: Internal Medicine

## 2011-05-25 DIAGNOSIS — R42 Dizziness and giddiness: Secondary | ICD-10-CM

## 2011-05-25 NOTE — Telephone Encounter (Signed)
Pt called would like to go ahead w/ referral to ENT still having dizziness. Informed referral placed and should be hearing something in the next few weeks.

## 2011-05-28 ENCOUNTER — Encounter: Payer: Self-pay | Admitting: Internal Medicine

## 2011-05-30 ENCOUNTER — Telehealth: Payer: Self-pay | Admitting: *Deleted

## 2011-05-30 NOTE — Telephone Encounter (Signed)
DISCUSS W/JORDAN. PACEMAKER SINCE LAST JUNE. ITCHING AND BREAKING OUT ON THE UPPER BACK. ORDER PRADAXA RECENTLY, SIDE EFFECTS OF PRADAXA CAN CAUSE ITCHING AND BREAKING OUT. DO [place] KNOW IF ANYONE ELSE  IS BREAKING OUT FROM PRADAXA. COULD YOU CALL HER AND DISCUSS PLEASE.

## 2011-05-31 NOTE — Telephone Encounter (Signed)
Per note from yesterday Dr. [place] does not feel that Pradaxa is causing itching. Advised to go back to [facility] and if not any better when we see in August will address it then. She was OK w/that.

## 2011-06-28 ENCOUNTER — Ambulatory Visit: Payer: Medicare Other | Admitting: Cardiology

## 2011-07-06 ENCOUNTER — Encounter: Payer: Self-pay | Admitting: Cardiology

## 2011-07-20 ENCOUNTER — Ambulatory Visit (INDEPENDENT_AMBULATORY_CARE_PROVIDER_SITE_OTHER): Payer: Medicare Other | Admitting: Cardiology

## 2011-07-20 ENCOUNTER — Encounter: Payer: Self-pay | Admitting: Cardiology

## 2011-07-20 DIAGNOSIS — E785 Hyperlipidemia, unspecified: Secondary | ICD-10-CM

## 2011-07-20 DIAGNOSIS — I10 Essential (primary) hypertension: Secondary | ICD-10-CM

## 2011-07-20 DIAGNOSIS — I2510 Atherosclerotic heart disease of native coronary artery without angina pectoris: Secondary | ICD-10-CM | POA: Insufficient documentation

## 2011-07-20 MED ORDER — ISOSORBIDE MONONITRATE ER 30 MG PO TB24: 30.0000 mg | ORAL_TABLET | Freq: Every day | ORAL | Status: DC

## 2011-07-20 MED ORDER — METOPROLOL SUCCINATE ER 50 MG PO TB24: 50.0000 mg | ORAL_TABLET | Freq: Every day | ORAL | Status: DC

## 2011-07-20 MED ORDER — DABIGATRAN ETEXILATE MESYLATE 150 MG PO CAPS: 150.0000 mg | ORAL_CAPSULE | Freq: Two times a day (BID) | ORAL | Status: DC

## 2011-07-20 MED ORDER — BENAZEPRIL HCL 40 MG PO TABS: 20.0000 mg | ORAL_TABLET | Freq: Every day | ORAL | Status: DC

## 2011-07-20 NOTE — Patient Instructions (Signed)
Continue your current medications.  Stay active.  I will see you again in 6 months.   

## 2011-07-20 NOTE — Progress Notes (Signed)
[name] Date of Birth: [birth year]   History of Present Illness: Mrs. Brierley is seen today for followup. She reports that she went to the emergency room in June for an episode of severe dizziness. This was diagnosed as vertigo. She has been taking meclizine as needed. These episodes have been much less severe and have been relatively infrequent. She denies any palpitations. She denies any shortness of breath. She has noted 2 days where she had a sharp pain beneath her left breast that was intermittent. This has also resolved.  Current Outpatient Prescriptions on File Prior to Visit  Medication Sig Dispense Refill  . Acetaminophen (TYLENOL ARTHRITIS PAIN PO) Take by mouth as needed.        . fish oil-omega-3 fatty acids 1000 MG capsule Take 2 g by mouth daily.        . nitroGLYCERIN (NITROSTAT) 0.4 MG SL tablet Place 0.4 mg under the tongue every 5 (five) minutes as needed. Per cards.       . polyethylene glycol powder (MIRALAX) powder Take 17 g by mouth daily.        Marland Kitchen DISCONTD: benazepril (LOTENSIN) 40 MG tablet Take 20 mg by mouth daily.       Marland Kitchen DISCONTD: isosorbide mononitrate (IMDUR) 30 MG 24 hr tablet Take 1 tablet by mouth daily.      Marland Kitchen DISCONTD: metoprolol (TOPROL-XL) 50 MG 24 hr tablet Take 1 tablet by mouth daily.      Marland Kitchen DISCONTD: PRADAXA 150 MG CAPS Take 1 capsule by mouth Twice daily.      . meclizine (ANTIVERT) 25 MG tablet Take 25 mg by mouth every 6 (six) hours as needed.        . simvastatin (ZOCOR) 40 MG tablet Take 40 mg by mouth at bedtime.        . traMADol (ULTRAM) 50 MG tablet Take 50 mg by mouth as needed.          Allergies  Allergen Reactions  . Amlodipine Besylate   . Codeine   . Diltiazem Hcl   . Irbesartan   . Nifedipine     Past Medical History  Diagnosis Date  . Hypertension   . Diverticulosis of colon   . Osteoarthritis   . Colonic polyp   . Dizziness   . Sick sinus syndrome     with paroxysmal atrial fibrillation  . Hyperlipidemia   .  Dyslipidemia   . Coronary artery disease     Moderate three vessel obstructive coronary disease  . Vertigo     Past Surgical History  Procedure Date  . Cholecystectomy     age 10  . Abdominal hysterectomy   . Oophorectomy     bilat for fibroids @ age 6, anemia pre op due to dysfunctional menses yo  . Tonsillectomy   . Breast biopsy     x 1  . Appendectomy     done at [facility]  . Breast lumpectomy 2009    Dr Jamey Ripa  . Paf induced mi, s/p pacer for brady-tach syndrome 04/2010    Dr.Shaine Mount  . Other surgical history hysterectomy  . Cholecystectomy   . Breast biopsy     History  Smoking status  . Never Smoker   Smokeless tobacco  . Not on file    History  Alcohol Use No    Family History  Problem Relation Age of Onset  . Hyperlipidemia Father   . Stroke Father     TIA,CVA  .  Cancer Mother   . Breast cancer Sister     11/08  . Breast cancer Maternal Aunt   . Colon cancer Maternal Aunt     Review of Systems: As noted in history of present illness. All other systems were reviewed and are negative.  Physical Exam: BP 108/58  Pulse 72  Ht 5\' 6"  (1.676 m)  Wt 183 lb 6.4 oz (83.19 kg)  BMI 29.60 kg/m2 She is an obese white female in no acute distress.The patient is alert and oriented x 3.  The mood and affect are normal.  The skin is warm and dry.  Color is normal.  The HEENT exam reveals that the sclera are nonicteric.  The mucous membranes are moist.  The carotids are 2+ without bruits.  There is no thyromegaly.  There is no JVD.  The lungs are clear.  The chest wall is non tender.  The heart exam reveals a regular rate with a normal S1 and S2.  There are no murmurs, gallops, or rubs.  The PMI is not displaced.   Abdominal exam reveals good bowel sounds.  There is no guarding or rebound.  There is no hepatosplenomegaly or tenderness.  There are no masses.  Exam of the legs reveal no clubbing, cyanosis, or edema.  The legs are without rashes.  The distal pulses are intact.   Cranial nerves II - XII are intact.  Motor and sensory functions are intact.  The gait is normal. LABORATORY DATA: Lipid panel from June of 2012 showed a total cholesterol of 226, triglycerides 274, HDL 35, and LDL of 137.  Assessment / Plan:

## 2011-07-20 NOTE — Assessment & Plan Note (Signed)
Blood pressure is under excellent control on current therapy.

## 2011-07-20 NOTE — Assessment & Plan Note (Signed)
Lipid parameters are not at goal. We reinforce recommendations for lowering her triglycerides including reduction in sugar intake and weight loss. Patient states that at her current age she is really not interested in making a lot of dietary changes.

## 2011-07-20 NOTE — Assessment & Plan Note (Signed)
She is having no significant anginal symptoms. We will continue with her current antianginal therapy. I have encouraged her in lifestyle modifications.

## 2011-09-11 ENCOUNTER — Encounter: Payer: Self-pay | Admitting: Internal Medicine

## 2011-09-11 ENCOUNTER — Ambulatory Visit (INDEPENDENT_AMBULATORY_CARE_PROVIDER_SITE_OTHER): Payer: Medicare Other | Admitting: Internal Medicine

## 2011-09-11 DIAGNOSIS — I10 Essential (primary) hypertension: Secondary | ICD-10-CM

## 2011-09-11 DIAGNOSIS — I498 Other specified cardiac arrhythmias: Secondary | ICD-10-CM

## 2011-09-11 DIAGNOSIS — IMO0002 Reserved for concepts with insufficient information to code with codable children: Secondary | ICD-10-CM

## 2011-09-11 DIAGNOSIS — I2510 Atherosclerotic heart disease of native coronary artery without angina pectoris: Secondary | ICD-10-CM

## 2011-09-11 DIAGNOSIS — I4891 Unspecified atrial fibrillation: Secondary | ICD-10-CM | POA: Insufficient documentation

## 2011-09-11 LAB — PACEMAKER DEVICE OBSERVATION
AL AMPLITUDE: 5.6 mv
AL THRESHOLD: 0.5 V
BAMS-0001: 150 {beats}/min
BATTERY VOLTAGE: 2.79 V
DEVICE MODEL PM: 233746
RV LEAD AMPLITUDE: 11.2 mv
RV LEAD THRESHOLD: 0.5 V

## 2011-09-11 NOTE — Progress Notes (Signed)
HPI Brooke Rios returns today for followup. She is a pleasant 77 yo woman with symptomatic bradycardia s/p PPM, HTN, and CAD. She denies c/p, sob or peripheral edema. No syncope. She also has atrial fib which has been well controlled. Allergies  Allergen Reactions  . Amlodipine Besylate   . Codeine   . Diltiazem Hcl   . Irbesartan   . Nifedipine      Current Outpatient Prescriptions  Medication Sig Dispense Refill  . Acetaminophen (TYLENOL ARTHRITIS PAIN PO) Take by mouth as needed.        . benazepril (LOTENSIN) 40 MG tablet Take 0.5 tablets (20 mg total) by mouth daily.  90 tablet  3  . dabigatran (PRADAXA) 150 MG CAPS Take 1 capsule (150 mg total) by mouth every 12 (twelve) hours.  180 capsule  3  . fish oil-omega-3 fatty acids 1000 MG capsule Take 2 g by mouth daily.        . isosorbide mononitrate (IMDUR) 30 MG 24 hr tablet Take 1 tablet (30 mg total) by mouth daily.  90 tablet  3  . meclizine (ANTIVERT) 25 MG tablet Take 25 mg by mouth every 6 (six) hours as needed.        . metoprolol (TOPROL-XL) 50 MG 24 hr tablet Take 1 tablet (50 mg total) by mouth daily.  90 tablet  3  . nitroGLYCERIN (NITROSTAT) 0.4 MG SL tablet Place 0.4 mg under the tongue every 5 (five) minutes as needed. Per cards.       . polyethylene glycol powder (MIRALAX) powder Take 17 g by mouth daily.        . traMADol (ULTRAM) 50 MG tablet Take 50 mg by mouth as needed.           Past Medical History  Diagnosis Date  . Hypertension   . Diverticulosis of colon   . Osteoarthritis   . Colonic polyp   . Dizziness   . Sick sinus syndrome     with paroxysmal atrial fibrillation  . Hyperlipidemia   . Dyslipidemia   . Coronary artery disease     Moderate three vessel obstructive coronary disease  . Vertigo     ROS:   All systems reviewed and negative except as noted in the HPI.   Past Surgical History  Procedure Date  . Cholecystectomy     age 34  . Abdominal hysterectomy   . Oophorectomy     bilat  for fibroids @ age 89, anemia pre op due to dysfunctional menses yo  . Tonsillectomy   . Breast biopsy     x 1  . Appendectomy     done at [facility]  . Breast lumpectomy 2009    Dr Jamey Ripa  . 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 Other surgical history hysterectomy  . Cholecystectomy   . Breast biopsy      Family History  Problem Relation Age of Onset  . Hyperlipidemia Father   . Stroke Father     TIA,CVA  . Cancer Mother   . Breast cancer Sister     11/08  . Breast cancer Maternal Aunt   . Colon cancer Maternal Aunt      History   Social History  . Marital Status: Widowed    Spouse Name: N/A    Number of Children: N/A  . Years of Education: N/A   Occupational History  . Not on file.   Social History Main  Topics  . Smoking status: Never Smoker   . Smokeless tobacco: Not on file  . Alcohol Use: No  . Drug Use: Not on file  . Sexually Active: Not on file   Other Topics Concern  . Not on file   Social History Narrative   Regular exercise- no      BP 110/76  Pulse 74  Ht 5\' 6"  (1.676 m)  Wt 182 lb 12.8 oz (82.918 kg)  BMI 29.50 kg/m2  Physical Exam:  Well appearing elderlry woman,  NAD HEENT: Unremarkable Neck:  No JVD, no thyromegally Lymphatics:  No adenopathy Back:  No CVA tenderness Lungs:  Clear with no wheezes, rales, or rhonchi. Well healed PPM incision. HEART:  Regular rate rhythm, no murmurs, no rubs, no clicks Abd:  soft, positive bowel sounds, no organomegally, no rebound, no guarding Ext:  2 plus pulses, no edema, no cyanosis, no clubbing Skin:  No rashes no nodules Neuro:  CN II through XII intact, motor grossly intact  DEVICE  Normal device function.  See PaceArt for details.   Assess/Plan:

## 2011-09-11 NOTE — Patient Instructions (Signed)
Your physician wants you to follow-up in: 6 months with device clinic and 12 months with Dr Taylor You will receive a reminder letter in the mail two months in advance. If you don't receive a letter, please call our office to schedule the follow-up appointment.  

## 2011-09-11 NOTE — Assessment & Plan Note (Signed)
She denies anginal symptoms. Continue current meds and maintain a low sodium diet.

## 2011-09-11 NOTE — Assessment & Plan Note (Signed)
Her blood pressure is well controlled. She will continue a low sodium diet. 

## 2011-09-11 NOTE — Assessment & Plan Note (Signed)
Her PPM interogation demonstrates that she is in NSR 99% of the time. She will continue her current meds.

## 2011-11-16 ENCOUNTER — Other Ambulatory Visit: Payer: Self-pay | Admitting: Internal Medicine

## 2011-11-16 DIAGNOSIS — Z1231 Encounter for screening mammogram for malignant neoplasm of breast: Secondary | ICD-10-CM

## 2011-12-04 ENCOUNTER — Ambulatory Visit
Admission: RE | Admit: 2011-12-04 | Discharge: 2011-12-04 | Disposition: A | Discharge status: Home / Self Care | Payer: Medicare Other | Source: Ambulatory Visit | Attending: Internal Medicine | Admitting: Internal Medicine

## 2011-12-04 DIAGNOSIS — Z1231 Encounter for screening mammogram for malignant neoplasm of breast: Secondary | ICD-10-CM | POA: Diagnosis not present

## 2011-12-10 ENCOUNTER — Other Ambulatory Visit: Payer: Self-pay | Admitting: Internal Medicine

## 2011-12-10 DIAGNOSIS — R928 Other abnormal and inconclusive findings on diagnostic imaging of breast: Secondary | ICD-10-CM

## 2011-12-18 ENCOUNTER — Ambulatory Visit
Admission: RE | Admit: 2011-12-18 | Discharge: 2011-12-18 | Disposition: A | Discharge status: Home / Self Care | Payer: Medicare Other | Source: Ambulatory Visit | Attending: Internal Medicine | Admitting: Internal Medicine

## 2011-12-18 DIAGNOSIS — R928 Other abnormal and inconclusive findings on diagnostic imaging of breast: Secondary | ICD-10-CM

## 2011-12-18 DIAGNOSIS — N63 Unspecified lump in unspecified breast: Secondary | ICD-10-CM | POA: Diagnosis not present

## 2011-12-18 DIAGNOSIS — N6019 Diffuse cystic mastopathy of unspecified breast: Secondary | ICD-10-CM | POA: Diagnosis not present

## 2012-01-02 DIAGNOSIS — L259 Unspecified contact dermatitis, unspecified cause: Secondary | ICD-10-CM | POA: Diagnosis not present

## 2012-01-31 DIAGNOSIS — IMO0002 Reserved for concepts with insufficient information to code with codable children: Secondary | ICD-10-CM | POA: Diagnosis not present

## 2012-01-31 DIAGNOSIS — M79609 Pain in unspecified limb: Secondary | ICD-10-CM | POA: Diagnosis not present

## 2012-01-31 DIAGNOSIS — M25579 Pain in unspecified ankle and joints of unspecified foot: Secondary | ICD-10-CM | POA: Diagnosis not present

## 2012-02-05 ENCOUNTER — Ambulatory Visit (INDEPENDENT_AMBULATORY_CARE_PROVIDER_SITE_OTHER): Payer: Medicare Other | Admitting: Cardiology

## 2012-02-05 ENCOUNTER — Encounter: Payer: Self-pay | Admitting: Cardiology

## 2012-02-05 VITALS — BP 112/56 | HR 79 | Ht 66.0 in | Wt 183.0 lb

## 2012-02-05 DIAGNOSIS — I2510 Atherosclerotic heart disease of native coronary artery without angina pectoris: Secondary | ICD-10-CM

## 2012-02-05 DIAGNOSIS — I10 Essential (primary) hypertension: Secondary | ICD-10-CM | POA: Diagnosis not present

## 2012-02-05 DIAGNOSIS — E785 Hyperlipidemia, unspecified: Secondary | ICD-10-CM

## 2012-02-05 DIAGNOSIS — I495 Sick sinus syndrome: Secondary | ICD-10-CM | POA: Insufficient documentation

## 2012-02-05 NOTE — Assessment & Plan Note (Signed)
She has a history of modest 3 vessel coronary disease by cardiac catheterization in 2011. She is asymptomatic on medical therapy. We will continue with her ACE inhibitor, beta blocker, and nitrates. She quit taking statin therapy because of side effects.

## 2012-02-05 NOTE — Progress Notes (Signed)
Brooke Rios Date of Birth: 1925-08-01   History of Present Illness: Brooke Rios is seen today for followup. She reports that she is doing very well. She has had no significant dizziness or palpitations. Occasionally she will awaken with pain in her left arm but this is usually after she has been sleeping on it. She takes nitroglycerin on occasion for slight chest discomfort that appears to be more indigestion related. It usually occurs after she eats salads.  Current Outpatient Prescriptions on File Prior to Visit  Medication Sig Dispense Refill  . Acetaminophen (TYLENOL ARTHRITIS PAIN PO) Take by mouth as needed.        . benazepril (LOTENSIN) 40 MG tablet Take 0.5 tablets (20 mg total) by mouth daily.  90 tablet  3  . dabigatran (PRADAXA) 150 MG CAPS Take 1 capsule (150 mg total) by mouth every 12 (twelve) hours.  180 capsule  3  . fish oil-omega-3 fatty acids 1000 MG capsule Take 2 g by mouth daily.        . isosorbide mononitrate (IMDUR) 30 MG 24 hr tablet Take 1 tablet (30 mg total) by mouth daily.  90 tablet  3  . meclizine (ANTIVERT) 25 MG tablet Take 25 mg by mouth every 6 (six) hours as needed.        . metoprolol (TOPROL-XL) 50 MG 24 hr tablet Take 1 tablet (50 mg total) by mouth daily.  90 tablet  3  . nitroGLYCERIN (NITROSTAT) 0.4 MG SL tablet Place 0.4 mg under the tongue every 5 (five) minutes as needed. Per cards.       . polyethylene glycol powder (MIRALAX) powder Take 17 g by mouth daily.          Allergies  Allergen Reactions  . Amlodipine Besylate   . Codeine   . Diltiazem Hcl   . Irbesartan   . Nifedipine     Past Medical History  Diagnosis Date  . Hypertension   . Diverticulosis of colon   . Osteoarthritis   . Colonic polyp   . Dizziness   . Sick sinus syndrome     with paroxysmal atrial fibrillation  . Hyperlipidemia   . Dyslipidemia   . Coronary artery disease     Moderate three vessel obstructive coronary disease  . Vertigo     Past Surgical  History  Procedure Date  . Cholecystectomy     age 24  . Abdominal hysterectomy   . Oophorectomy     bilat for fibroids @ age 7, anemia pre op due to dysfunctional menses yo  . Tonsillectomy   . Breast biopsy     x 1  . Appendectomy     done at [facility]  . Breast lumpectomy 2009    Dr Jamey Ripa  . Paf induced mi, s/p pacer for brady-tach syndrome 04/2010    Dr.Braxten Memmer  . Other surgical history hysterectomy  . Cholecystectomy   . Breast biopsy     History  Smoking status  . Never Smoker   Smokeless tobacco  . Not on file    History  Alcohol Use No    Family History  Problem Relation Age of Onset  . Hyperlipidemia Father   . Stroke Father     TIA,CVA  . Cancer Mother   . Breast cancer Sister     11/08  . Breast cancer Maternal Aunt   . Colon cancer Maternal Aunt     Review of Systems: As noted in history of present  illness. All other systems were reviewed and are negative.  Physical Exam: BP 112/56  Pulse 79  Ht 5\' 6"  (1.676 m)  Wt 83.008 kg (183 lb)  BMI 29.54 kg/m2 She is an obese white female in no acute distress.The patient is alert and oriented x 3.  The mood and affect are normal.  The skin is warm and dry.  Color is normal.  The HEENT exam reveals that the sclera are nonicteric.  The mucous membranes are moist.  The carotids are 2+ without bruits.  There is no thyromegaly.  There is no JVD.  The lungs are clear.  The chest wall is non tender.  The heart exam reveals a regular rate with a normal S1 and S2.  There are no murmurs, gallops, or rubs.  The PMI is not displaced.   Abdominal exam reveals good bowel sounds.  There is no guarding or rebound.  There is no hepatosplenomegaly or tenderness.  There are no masses.  Exam of the legs reveal no clubbing, cyanosis, or edema.  The legs are without rashes.  The distal pulses are intact.  Cranial nerves II - XII are intact.  Motor and sensory functions are intact.  The gait is normal. LABORATORY DATA: ECG today  demonstrates an atrial paced rhythm with left axis deviation. It is otherwise normal.  Assessment / Plan:

## 2012-02-05 NOTE — Assessment & Plan Note (Signed)
We reviewed her last lipid panel. Her triglycerides are elevated. I recommended increasing her fish oil supplement to 4g a day.

## 2012-02-05 NOTE — Patient Instructions (Signed)
Try increasing your fish oil gradually to 4 tablets daily.  Continue your other medication.  I will see you again in 6 months.

## 2012-02-05 NOTE — Assessment & Plan Note (Signed)
Blood pressure is in excellent control today. 

## 2012-02-05 NOTE — Assessment & Plan Note (Signed)
She has no symptoms of tachycardia. She is scheduled for pacemaker followup in April. We will continue with her current metoprolol dose. She is on Pradaxa for anticoagulation.

## 2012-03-12 ENCOUNTER — Encounter: Payer: Self-pay | Admitting: Internal Medicine

## 2012-03-12 ENCOUNTER — Ambulatory Visit (INDEPENDENT_AMBULATORY_CARE_PROVIDER_SITE_OTHER): Payer: Medicare Other | Admitting: *Deleted

## 2012-03-12 DIAGNOSIS — I495 Sick sinus syndrome: Secondary | ICD-10-CM | POA: Diagnosis not present

## 2012-03-12 LAB — PACEMAKER DEVICE OBSERVATION
AL AMPLITUDE: 4 mv
AL IMPEDENCE PM: 408 Ohm
BATTERY VOLTAGE: 2.79 V
RV LEAD AMPLITUDE: 5.6 mv
RV LEAD IMPEDENCE PM: 438 Ohm
VENTRICULAR PACING PM: 3

## 2012-03-12 NOTE — Progress Notes (Signed)
PPM check 

## 2012-04-29 ENCOUNTER — Other Ambulatory Visit: Payer: Self-pay | Admitting: *Deleted

## 2012-04-29 MED ORDER — ISOSORBIDE MONONITRATE ER 30 MG PO TB24: 30.0000 mg | ORAL_TABLET | Freq: Every day | ORAL | Status: DC

## 2012-04-29 NOTE — Telephone Encounter (Signed)
Isosorbide reordered

## 2012-06-12 ENCOUNTER — Encounter: Payer: Self-pay | Admitting: Internal Medicine

## 2012-06-12 ENCOUNTER — Ambulatory Visit (INDEPENDENT_AMBULATORY_CARE_PROVIDER_SITE_OTHER): Payer: Medicare Other | Admitting: Internal Medicine

## 2012-06-12 VITALS — BP 128/70 | HR 88 | Wt 178.4 lb

## 2012-06-12 DIAGNOSIS — N182 Chronic kidney disease, stage 2 (mild): Secondary | ICD-10-CM

## 2012-06-12 DIAGNOSIS — R7309 Other abnormal glucose: Secondary | ICD-10-CM | POA: Diagnosis not present

## 2012-06-12 DIAGNOSIS — D692 Other nonthrombocytopenic purpura: Secondary | ICD-10-CM

## 2012-06-12 DIAGNOSIS — N390 Urinary tract infection, site not specified: Secondary | ICD-10-CM

## 2012-06-12 DIAGNOSIS — E785 Hyperlipidemia, unspecified: Secondary | ICD-10-CM | POA: Diagnosis not present

## 2012-06-12 DIAGNOSIS — T887XXA Unspecified adverse effect of drug or medicament, initial encounter: Secondary | ICD-10-CM | POA: Insufficient documentation

## 2012-06-12 DIAGNOSIS — I10 Essential (primary) hypertension: Secondary | ICD-10-CM | POA: Diagnosis not present

## 2012-06-12 DIAGNOSIS — R739 Hyperglycemia, unspecified: Secondary | ICD-10-CM | POA: Insufficient documentation

## 2012-06-12 LAB — LIPID PANEL
Cholesterol: 209 mg/dL — ABNORMAL HIGH (ref 0–200)
HDL: 36.6 mg/dL — ABNORMAL LOW (ref >39.00)
Triglycerides: 279 mg/dL — ABNORMAL HIGH (ref 0.0–149.0)

## 2012-06-12 LAB — CBC WITH DIFFERENTIAL/PLATELET
Basophils Relative: 0.7 % (ref 0.0–3.0)
Eosinophils Absolute: 0.1 10*3/uL (ref 0.0–0.7)
Eosinophils Relative: 2 % (ref 0.0–5.0)
HCT: 39.4 % (ref 36.0–46.0)
Lymphs Abs: 1.2 10*3/uL (ref 0.7–4.0)
MCHC: 32.8 g/dL (ref 30.0–36.0)
MCV: 86.1 fl (ref 78.0–100.0)
Monocytes Absolute: 0.3 10*3/uL (ref 0.1–1.0)
Neutrophils Relative %: 62 % (ref 43.0–77.0)
Platelets: 183 10*3/uL (ref 150.0–400.0)
WBC: 4 10*3/uL — ABNORMAL LOW (ref 4.5–10.5)

## 2012-06-12 LAB — LDL CHOLESTEROL, DIRECT: Direct LDL: 114 mg/dL

## 2012-06-12 LAB — BASIC METABOLIC PANEL
BUN: 24 mg/dL — ABNORMAL HIGH (ref 6–23)
CO2: 27 mEq/L (ref 19–32)
Chloride: 102 mEq/L (ref 96–112)
Creatinine, Ser: 1.5 mg/dL — ABNORMAL HIGH (ref 0.4–1.2)
Glucose, Bld: 99 mg/dL (ref 70–99)
Potassium: 4.4 mEq/L (ref 3.5–5.1)

## 2012-06-12 LAB — POCT URINALYSIS DIPSTICK
Bilirubin, UA: NEGATIVE
Glucose, UA: NEGATIVE
Spec Grav, UA: 1.015
pH, UA: 6.5

## 2012-06-12 LAB — HEPATIC FUNCTION PANEL
ALT: 18 U/L (ref 0–35)
Bilirubin, Direct: 0 mg/dL (ref 0.0–0.3)
Total Bilirubin: 0.5 mg/dL (ref 0.3–1.2)
Total Protein: 7 g/dL (ref 6.0–8.3)

## 2012-06-12 NOTE — Assessment & Plan Note (Signed)
Renal function improved from 2011-2012. BMET and urinalysis will be checked

## 2012-06-12 NOTE — Assessment & Plan Note (Addendum)
A1c will be checked; nutritional intervention based on results will be discussed

## 2012-06-12 NOTE — Addendum Note (Signed)
Addended by: Silvio Pate D on: 06/12/2012 10:22 AM   Modules accepted: Orders

## 2012-06-12 NOTE — Assessment & Plan Note (Signed)
CBC and differential will be checked 

## 2012-06-12 NOTE — Assessment & Plan Note (Signed)
By history major issue has been hypertriglyceridemia; she also has had elevated random glucoses. Fasting lipids will be checked

## 2012-06-12 NOTE — Patient Instructions (Addendum)
Preventive Health Care: Exercise  30-45  minutes a day, 3-4 days a week. Walking is especially valuable in preventing Osteoporosis. Eat a low-fat diet with lots of fruits and vegetables, up to 7-9 servings per day. Consume less than 30 grams of sugar per day from foods & drinks with High Fructose Corn Syrup as # 1,2,3 or #4 on label. Blood Pressure Goal  Ideally is an AVERAGE < 135/85. This AVERAGE should be calculated from @ least 5-7 BP readings taken @ different times of day on different days of week. You should not respond to isolated BP readings , but rather the AVERAGE for that week . Please try to go on My Chart within the next 24 hours to allow me to release the results directly to you.

## 2012-06-12 NOTE — Progress Notes (Signed)
  Subjective:    Patient ID: Brooke Rios, female    DOB: 1925/02/21, 78 y.o.   MRN: [id number]  HPI HYPERTENSION: Disease Monitoring: Blood pressure range-not monitored  Chest pain, palpitations- no       Dyspnea- no Medications: Compliance-yes  Lightheadedness,Syncope- no    Edema- no  FASTING HYPERGLYCEMIA, PMH of: 166 in 06/12  Polyuria/phagia/dipsia- no       Visual problems- no Medications: Compliance- no meds; no specific diet  HYPERLIPIDEMIA: Disease Monitoring: See symptoms for Hypertension Medications: Compliance- no meds; on fish oil supplement   Past medical history/family history/social history were all reviewed and updated. Pertinent data: pacer placed & Pradaxa started  2011        Review of Systems Abd pain, bowel changes- only chronic constipation   Muscle aches- no Dysuria, pyuria,hematuria- no  She has had mild thrombocytopenia and is on Primaxin. She is not had epistaxis, hemoptysis, melena, rectal bleeding she continues to have easy bruising      Objective:   Physical Exam Gen.: Healthy and well-nourished in appearance. Alert, appropriate and cooperative throughout exam.Appears younger than stated age  Eyes: No corneal or conjunctival inflammation noted.  Neck: No deformities, masses, or tenderness noted. No NVD. Thyroid normal. Lungs: Normal respiratory effort; chest expands symmetrically. Lungs are clear to auscultation without rales, wheezes, or increased work of breathing. Heart: Normal rate and rhythm. Normal S1 and S2. No gallop, click, or rub. S4 w/o murmur. Abdomen: Bowel sounds normal; abdomen soft and nontender. No masses, organomegaly or hernias noted. Genitalia: Dr Nicholas Lose, Gyn                                                      Musculoskeletal/extremities:  No clubbing, cyanosis, edema noted. Range of motion  normal .Tone & strength  normal.Joints :minor DIP OA changes. Nail health  good. Vascular: Carotid, radial artery, dorsalis  pedis and  posterior tibial pulses are full and equal. No bruits present. Neurologic: Alert and oriented x3. Deep tendon reflexes symmetrical and normal.          Skin: Intact without suspicious lesions or rashes. Lymph: No cervical, axillary lymphadenopathy present. Psych: Mood and affect are normal. Normally interactive                                                                                         Assessment & Plan:

## 2012-06-14 LAB — URINE CULTURE: Colony Count: >=100000

## 2012-06-27 ENCOUNTER — Other Ambulatory Visit: Payer: Self-pay | Admitting: Physician Assistant

## 2012-06-27 DIAGNOSIS — L821 Other seborrheic keratosis: Secondary | ICD-10-CM | POA: Diagnosis not present

## 2012-06-27 DIAGNOSIS — D485 Neoplasm of uncertain behavior of skin: Secondary | ICD-10-CM | POA: Diagnosis not present

## 2012-07-15 ENCOUNTER — Other Ambulatory Visit: Payer: Self-pay | Admitting: Cardiology

## 2012-07-18 ENCOUNTER — Other Ambulatory Visit: Payer: Self-pay | Admitting: Physician Assistant

## 2012-07-18 DIAGNOSIS — L570 Actinic keratosis: Secondary | ICD-10-CM | POA: Diagnosis not present

## 2012-07-18 DIAGNOSIS — D485 Neoplasm of uncertain behavior of skin: Secondary | ICD-10-CM | POA: Diagnosis not present

## 2012-08-26 ENCOUNTER — Telehealth: Payer: Self-pay | Admitting: Cardiology

## 2012-08-26 MED ORDER — NITROGLYCERIN 0.4 MG SL SUBL: 0.4000 mg | SUBLINGUAL_TABLET | SUBLINGUAL | Status: DC | PRN

## 2012-08-26 NOTE — Telephone Encounter (Signed)
Pt having neck, shoulder pain, took two nitro, felt better, about noon starting hurting again, so took two more, needs refills of nitro , [facility] @ [phone]

## 2012-08-26 NOTE — Telephone Encounter (Signed)
Spoke to patient she states she has had pain in lf shoulder,lf arm, radiates up into lf neck.States has had 2 episodes.States took 2 NTG tablets at one time with relief.Advised on how to take NTG, was told not to take 2 tablets at same time.States she is not having any pain at present.Appointment scheduled with Norma Fredrickson NP 08/28/12.Advised to go to [facility] ER if needed.

## 2012-08-27 DIAGNOSIS — R509 Fever, unspecified: Secondary | ICD-10-CM | POA: Diagnosis not present

## 2012-08-27 DIAGNOSIS — J029 Acute pharyngitis, unspecified: Secondary | ICD-10-CM | POA: Diagnosis not present

## 2012-08-27 DIAGNOSIS — J039 Acute tonsillitis, unspecified: Secondary | ICD-10-CM | POA: Diagnosis not present

## 2012-08-28 ENCOUNTER — Ambulatory Visit (INDEPENDENT_AMBULATORY_CARE_PROVIDER_SITE_OTHER): Payer: Medicare Other | Admitting: Nurse Practitioner

## 2012-08-28 ENCOUNTER — Encounter: Payer: Self-pay | Admitting: Nurse Practitioner

## 2012-08-28 VITALS — BP 114/66 | HR 89 | Ht 66.0 in | Wt 181.8 lb

## 2012-08-28 DIAGNOSIS — R0789 Other chest pain: Secondary | ICD-10-CM

## 2012-08-28 NOTE — Progress Notes (Signed)
Brooke Rios Date of Birth: Jul 15, 1925 Medical Record #[id number]  History of Present Illness: Brooke Rios is seen today for a work in visit. She is seen for Dr. [place]. She has a history of SSS with PAF and has a PTVP in place. Has HTN, HLD and moderate 3 vessel CAD per cath back in 2011 noted. She remains obese.   She comes in today. She is here at the request of her family. She had been doing very well up this past Tuesday. Two weeks ago, she got her flu shot. Tuesday morning she woke up with her left shoulder and neck hurting. Her arm hurt where the flu shot had been given to her. Her throat felt raw. She got worried and took 2 NTG at one time. She had no real relief. Seemed like it was worse with movement but she is not sure. She only took the NTG because of "where" she was hurting. She took some pain medicines later and had complete relief. Since then, she has developed fever and now has an active URI. Went to Urgent Care yesterday and is now on antibiotics. She has no chest pain. She has not been short of breath. She is not lightheaded or dizzy. Overall, she feels like her heart is fine. Her rhythm has been ok. No problems with her Pradaxa.  Current Outpatient Prescriptions on File Prior to Visit  Medication Sig Dispense Refill  . benazepril (LOTENSIN) 40 MG tablet Take 0.5 tablets (20 mg total) by mouth daily.  90 tablet  3  . dabigatran (PRADAXA) 150 MG CAPS Take 1 capsule (150 mg total) by mouth every 12 (twelve) hours.  180 capsule  3  . fish oil-omega-3 fatty acids 1000 MG capsule Take 2 g by mouth daily.        . isosorbide mononitrate (IMDUR) 30 MG 24 hr tablet Take 1 tablet (30 mg total) by mouth daily.  90 tablet  3  . metoprolol succinate (TOPROL-XL) 50 MG 24 hr tablet TAKE 1 TABLET (50 MG TOTAL) DAILY  90 tablet  2  . nitroGLYCERIN (NITROSTAT) 0.4 MG SL tablet Place 1 tablet (0.4 mg total) under the tongue every 5 (five) minutes as needed. Per cards.  25 tablet  11    Allergies    Allergen Reactions  . Amlodipine Besylate   . Diltiazem Hcl   . Irbesartan   . Nifedipine   . Codeine     nausea    Past Medical History  Diagnosis Date  . Hypertension   . Diverticulosis of colon   . Osteoarthritis   . Colonic polyp   . Sick sinus syndrome     with paroxysmal atrial fibrillation  . Hyperlipidemia   . Dyslipidemia   . Coronary artery disease     Moderate three vessel obstructive coronary disease  . Vertigo   . Chronic kidney disease 2011    creat 1.5     Past Surgical History  Procedure Date  . Cholecystectomy     age 78  . Abdominal hysterectomy   . Oophorectomy     bilat for fibroids @ age 78, anemia pre op due to dysfunctional menses yo  . Tonsillectomy   . Breast biopsy     x 1  . Appendectomy     done at [facility]  . Breast lumpectomy 2009    Dr Jamey Ripa  . 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 Other surgical history hysterectomy  .  Cholecystectomy   . Breast biopsy   . Pacemaker insertion 2011    History  Smoking status  . Never Smoker   Smokeless tobacco  . Not on file    History  Alcohol Use No    Family History  Problem Relation Age of Onset  . Hyperlipidemia Father   . Stroke Father     TIA,CVA  . Heart failure Father   . Cancer Mother     ? renal primary  . Breast cancer Sister     11/08  . Breast cancer Maternal Aunt   . Colon cancer Maternal Aunt   . Breast cancer Daughter     lumpectomy 2007;mastectomy 2013  . Heart disease Brother     pacer    Review of Systems: The review of systems is per the HPI.  All other systems were reviewed and are negative.  Physical Exam: BP 114/66  Pulse 89  Ht 5\' 6"  (1.676 m)  Wt 181 lb 12.8 oz (82.464 kg)  BMI 29.34 kg/m2 Patient is very pleasant and in no acute distress. She is quite hoarse. Seems congested from her URI. Skin is warm and dry. Color is normal.  HEENT is unremarkable. Normocephalic/atraumatic. PERRL. Sclera are nonicteric.  Neck is supple. No masses. No JVD. Lungs are clear. Cardiac exam shows a regular rate and rhythm. Abdomen is soft. Extremities are without edema. Gait and ROM are intact. No gross neurologic deficits noted.  LABORATORY DATA: EKG shows a pacing, v sensing.    Assessment / Plan:  1. Atypical shoulder/neck pain - now with active URI and fever. I suspect this was the cause of her symptoms from Tuesday. She will continue with her antibiotics.  2. CAD - she was due for her regular follow up later this month. She would like to postpone until next month. She may need repeat stress testing just in follow up of her known CAD. I do not think her current spells are related but she is to let us know if further problems arise.   Patient is agreeable to this plan and will call if any problems develop in the interim.

## 2012-08-28 NOTE — Patient Instructions (Signed)
Let's check an EKG today  Let's change your visit with Dr. Sw[place] to mid November  Stay on your current medicines  Call the [facility] office at [phone] if you have any questions, problems or concerns.

## 2012-09-02 ENCOUNTER — Ambulatory Visit: Payer: Medicare Other | Admitting: Cardiology

## 2012-09-09 ENCOUNTER — Encounter: Payer: Self-pay | Admitting: Cardiology

## 2012-09-10 ENCOUNTER — Encounter: Payer: Self-pay | Admitting: *Deleted

## 2012-09-10 DIAGNOSIS — Z950 Presence of cardiac pacemaker: Secondary | ICD-10-CM | POA: Insufficient documentation

## 2012-09-16 ENCOUNTER — Encounter: Payer: Self-pay | Admitting: Internal Medicine

## 2012-09-16 ENCOUNTER — Other Ambulatory Visit: Payer: Self-pay | Admitting: Cardiology

## 2012-09-16 ENCOUNTER — Ambulatory Visit (INDEPENDENT_AMBULATORY_CARE_PROVIDER_SITE_OTHER): Payer: Medicare Other | Admitting: Internal Medicine

## 2012-09-16 VITALS — BP 118/71 | HR 67 | Ht 66.0 in | Wt 182.0 lb

## 2012-09-16 DIAGNOSIS — I498 Other specified cardiac arrhythmias: Secondary | ICD-10-CM

## 2012-09-16 DIAGNOSIS — IMO0002 Reserved for concepts with insufficient information to code with codable children: Secondary | ICD-10-CM

## 2012-09-16 DIAGNOSIS — I10 Essential (primary) hypertension: Secondary | ICD-10-CM | POA: Diagnosis not present

## 2012-09-16 DIAGNOSIS — Z950 Presence of cardiac pacemaker: Secondary | ICD-10-CM | POA: Diagnosis not present

## 2012-09-16 DIAGNOSIS — I2510 Atherosclerotic heart disease of native coronary artery without angina pectoris: Secondary | ICD-10-CM

## 2012-09-16 LAB — PACEMAKER DEVICE OBSERVATION
ATRIAL PACING PM: 99
BAMS-0001: 150 {beats}/min
BATTERY VOLTAGE: 2.79 V
DEVICE MODEL PM: 233746
RV LEAD THRESHOLD: 0.625 V
VENTRICULAR PACING PM: 4

## 2012-09-16 NOTE — Progress Notes (Signed)
HPI [name] returns today for followup. She is a very pleasant 78 year old woman, who looks younger than her stated age, with a history of symptomatic bradycardia, hypertension, status post permanent pacemaker insertion. In the interim, the patient has been stable. She admits to occasional dietary indiscretion. She denies chest pain, shortness of breath, or peripheral edema. No syncope. Allergies  Allergen Reactions  . Amlodipine Besylate   . Diltiazem Hcl   . Irbesartan   . Nifedipine   . Codeine     nausea     Current Outpatient Prescriptions  Medication Sig Dispense Refill  . acetaminophen ([facility] ARTHRITIS PAIN RELIEF) 650 MG CR tablet Take 650 mg by mouth every 8 (eight) hours as needed.      . fish oil-omega-3 fatty acids 1000 MG capsule Take 2 g by mouth daily.        . isosorbide mononitrate (IMDUR) 30 MG 24 hr tablet Take 1 tablet (30 mg total) by mouth daily.  90 tablet  3  . metoprolol succinate (TOPROL-XL) 50 MG 24 hr tablet TAKE 1 TABLET (50 MG TOTAL) DAILY  90 tablet  2  . nitroGLYCERIN (NITROSTAT) 0.4 MG SL tablet Place 1 tablet (0.4 mg total) under the tongue every 5 (five) minutes as needed. Per cards.  25 tablet  11  . DISCONTD: benazepril (LOTENSIN) 40 MG tablet Take 0.5 tablets (20 mg total) by mouth daily.  90 tablet  3  . DISCONTD: dabigatran (PRADAXA) 150 MG CAPS Take 1 capsule (150 mg total) by mouth every 12 (twelve) hours.  180 capsule  3  . benazepril (LOTENSIN) 40 MG tablet TAKE ONE-HALF (1/2) TABLET (20 MG TOTAL) DAILY  90 tablet  2  . PRADAXA 150 MG CAPS TAKE 1 CAPSULE (150 MG TOTAL) EVERY 12 HOURS  3 capsule  2     Past Medical History  Diagnosis Date  . Hypertension   . Diverticulosis of colon   . Osteoarthritis   . Colonic polyp   . Sick sinus syndrome     with paroxysmal atrial fibrillation  . Hyperlipidemia   . Dyslipidemia   . Coronary artery disease     Moderate three vessel obstructive coronary disease  . Vertigo   . Chronic kidney disease  2011    creat 1.5     ROS:   All systems reviewed and negative except as noted in the HPI.   Past Surgical History  Procedure Date  . Cholecystectomy     age 59  . Abdominal hysterectomy   . Oophorectomy     bilat for fibroids @ age 11, anemia pre op due to dysfunctional menses yo  . Tonsillectomy   . Breast biopsy     x 1  . Appendectomy     done at [facility]  . Breast lumpectomy 2009    Dr Jamey Ripa  . 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 Other surgical history hysterectomy  . Cholecystectomy   . Breast biopsy   . Pacemaker insertion 2011     Family History  Problem Relation Age of Onset  . Hyperlipidemia Father   . Stroke Father     TIA,CVA  . Heart failure Father   . Cancer Mother     ? renal primary  . Breast cancer Sister     11/08  . Breast cancer Maternal Aunt   . Colon cancer Maternal Aunt   . Breast cancer Daughter     lumpectomy 2007;mastectomy  2013  . Heart disease Brother     pacer     History   Social History  . Marital Status: Widowed    Spouse Name: N/A    Number of Children: N/A  . Years of Education: N/A   Occupational History  . Not on file.   Social History Main Topics  . Smoking status: Never Smoker   . Smokeless tobacco: Not on file  . Alcohol Use: No  . Drug Use: No  . Sexually Active: Not Currently   Other Topics Concern  . Not on file   Social History Narrative   Regular exercise- no      BP 118/71  Pulse 67  Ht 5\' 6"  (1.676 m)  Wt 182 lb (82.555 kg)  BMI 29.38 kg/m2  SpO2 98%  Physical Exam:  Well appearing elderly woman, NAD HEENT: Unremarkable Neck:  No JVD, no thyromegally Lungs:  Clear with no wheezes, rales, or rhonchi. HEART:  Regular rate rhythm, no murmurs, no rubs, no clicks Abd:  soft, positive bowel sounds, no organomegally, no rebound, no guarding Ext:  2 plus pulses, no edema, no cyanosis, no clubbing Skin:  No rashes no nodules Neuro:  CN II through XII  intact, motor grossly intact  DEVICE  Normal device function.  See PaceArt for details.   Assess/Plan:

## 2012-09-16 NOTE — Assessment & Plan Note (Signed)
Her blood pressure is well controlled despite her dietary indiscretion. I've encouraged the patient to reduce her sodium intake.

## 2012-09-16 NOTE — Assessment & Plan Note (Signed)
Her [employer]onic pacemaker is working normally. We'll plan to recheck in several months. 

## 2012-09-16 NOTE — Assessment & Plan Note (Signed)
She denies anginal symptoms. No change in medical therapy. I've encouraged the patient to continue her physical activity.

## 2012-09-16 NOTE — Patient Instructions (Signed)
Your physician wants you to follow-up in: 12 months with Dr Court Joy will receive a reminder letter in the mail two months in advance. If you don't receive a letter, please call our office to schedule the follow-up appointment.   Remote monitoring is used to monitor your Pacemaker of ICD from home. This monitoring reduces the number of office visits required to check your device to one time per year. It allows [place] to keep an eye on the functioning of your device to ensure it is working properly. You are scheduled for a device check from home on 12/22/12. You may send your transmission at any time that day. If you have a wireless device, the transmission will be sent automatically. After your physician reviews your transmission, you will receive a postcard with your next transmission date.

## 2012-09-30 DIAGNOSIS — H26019 Infantile and juvenile cortical, lamellar, or zonular cataract, unspecified eye: Secondary | ICD-10-CM | POA: Diagnosis not present

## 2012-10-08 ENCOUNTER — Encounter: Payer: Self-pay | Admitting: Cardiology

## 2012-10-08 ENCOUNTER — Ambulatory Visit (INDEPENDENT_AMBULATORY_CARE_PROVIDER_SITE_OTHER): Payer: Medicare Other | Admitting: Cardiology

## 2012-10-08 VITALS — BP 120/72 | HR 80 | Ht 66.0 in | Wt 183.8 lb

## 2012-10-08 DIAGNOSIS — I495 Sick sinus syndrome: Secondary | ICD-10-CM | POA: Diagnosis not present

## 2012-10-08 DIAGNOSIS — I2510 Atherosclerotic heart disease of native coronary artery without angina pectoris: Secondary | ICD-10-CM

## 2012-10-08 DIAGNOSIS — I10 Essential (primary) hypertension: Secondary | ICD-10-CM

## 2012-10-08 DIAGNOSIS — I4891 Unspecified atrial fibrillation: Secondary | ICD-10-CM

## 2012-10-08 NOTE — Progress Notes (Signed)
Brooke Rios Date of Birth: 01/18/1925 Medical Record #[id number]  History of Present Illness: Brooke Rios is seen today for a followup visit today. She has a history of SSS with PAF and has a PTVP in place. Has HTN, HLD and moderate 3 vessel CAD per cath back in 2011. She reports she is doing well from a cardiac standpoint. She's had no significant chest pain, shortness of breath, or palpitations. She's had no dizziness. Her recent pacemaker check showed she was in atrial fibrillation 0.5% of the time. Her longest episode lasted 3 hours and 40 minutes. Recently she did receive a flu vaccine. About a week later she developed significant pain in her left arm and shoulder radiating into her neck. She had a low-grade fever and cough. The symptoms resolved after a few days.  Current Outpatient Prescriptions on File Prior to Visit  Medication Sig Dispense Refill  . acetaminophen ([employer] ARTHRITIS PAIN RELIEF) 650 MG CR tablet Take 650 mg by mouth every 8 (eight) hours as needed.      . benazepril (LOTENSIN) 40 MG tablet TAKE ONE-HALF (1/2) TABLET (20 MG TOTAL) DAILY  90 tablet  2  . fish oil-omega-3 fatty acids 1000 MG capsule Take 2 g by mouth daily.        . isosorbide mononitrate (IMDUR) 30 MG 24 hr tablet Take 1 tablet (30 mg total) by mouth daily.  90 tablet  3  . metoprolol succinate (TOPROL-XL) 50 MG 24 hr tablet TAKE 1 TABLET (50 MG TOTAL) DAILY  90 tablet  2  . nitroGLYCERIN (NITROSTAT) 0.4 MG SL tablet Place 1 tablet (0.4 mg total) under the tongue every 5 (five) minutes as needed. Per cards.  25 tablet  11  . PRADAXA 150 MG CAPS TAKE 1 CAPSULE (150 MG TOTAL) EVERY 12 HOURS  3 capsule  2    Allergies  Allergen Reactions  . Amlodipine Besylate   . Diltiazem Hcl   . Irbesartan   . Nifedipine   . Codeine     nausea    Past Medical History  Diagnosis Date  . Hypertension   . Diverticulosis of colon   . Osteoarthritis   . Colonic polyp   . Sick sinus syndrome     with paroxysmal  atrial fibrillation  . Hyperlipidemia   . Dyslipidemia   . Coronary artery disease     Moderate three vessel obstructive coronary disease  . Vertigo   . Chronic kidney disease 2011    creat 1.5     Past Surgical History  Procedure Date  . Cholecystectomy     age 74  . Abdominal hysterectomy   . Oophorectomy     bilat for fibroids @ age 72, anemia pre op due to dysfunctional menses yo  . Tonsillectomy   . Breast biopsy     x 1  . Appendectomy     done at [facility]  . Breast lumpectomy 2009    Dr Jamey Ripa  . Paf induced mi, s/p pacer for brady-tach syndrome 04/2010    Dr.Analei Whinery  . Other surgical history hysterectomy  . Cholecystectomy   . Breast biopsy   . Pacemaker insertion 2011    History  Smoking status  . Never Smoker   Smokeless tobacco  . Not on file    History  Alcohol Use No    Family History  Problem Relation Age of Onset  . Hyperlipidemia Father   . Stroke Father     TIA,CVA  . Heart failure  Father   . Cancer Mother     ? renal primary  . Breast cancer Sister     11/08  . Breast cancer Maternal Aunt   . Colon cancer Maternal Aunt   . Breast cancer Daughter     lumpectomy 2007;mastectomy 2013  . Heart disease Brother     pacer    Review of Systems: The review of systems is per the HPI.  All other systems were reviewed and are negative.  Physical Exam: BP 120/72  Pulse 80  Ht 5\' 6"  (1.676 m)  Wt 183 lb 12.8 oz (83.371 kg)  BMI 29.67 kg/m2  SpO2 97% Patient is very pleasant and in no acute distress. She is quite hoarse. Seems congested from her URI. Skin is warm and dry. Color is normal.  HEENT is unremarkable. Normocephalic/atraumatic. PERRL. Sclera are nonicteric. Neck is supple. No masses. No JVD. Lungs are clear. Cardiac exam shows a regular rate and rhythm. Abdomen is soft. Extremities are without edema. Gait and ROM are intact. No gross neurologic deficits noted.  LABORATORY DATA:    Assessment / Plan: 1. Paroxysmal atrial fibrillation.  Patient is on chronic rate control with metoprolol. She is anticoagulated with Pradaxa.  2. Sick sinus syndrome status post pacemaker implant in 2011.  3. Coronary disease with moderate 3 vessel disease by prior cardiac catheterization. She is asymptomatic on medical therapy. We will continue her current therapy and followup in 6 months. 1. Atypical shoulder/neck pain - now with active URI and fever. I suspect this was the cause of her symptoms from Tuesday. She will continue with her antibiotics.  2. CAD - she was due for her regular follow up later this month. She would like to postpone until next month. She may need repeat stress testing just in follow up of her known CAD. I do not think her current spells are related but she is to let us know if further problems arise.   Patient is agreeable to this plan and will call if any problems develop in the interim.

## 2012-10-08 NOTE — Patient Instructions (Signed)
Continue your current therapy  I will see you again in 6 months.   

## 2012-12-12 ENCOUNTER — Other Ambulatory Visit: Payer: Self-pay | Admitting: Physician Assistant

## 2012-12-12 DIAGNOSIS — D485 Neoplasm of uncertain behavior of skin: Secondary | ICD-10-CM | POA: Diagnosis not present

## 2012-12-12 DIAGNOSIS — L820 Inflamed seborrheic keratosis: Secondary | ICD-10-CM | POA: Diagnosis not present

## 2012-12-22 ENCOUNTER — Encounter: Payer: Medicare Other | Admitting: *Deleted

## 2012-12-26 ENCOUNTER — Encounter: Payer: Self-pay | Admitting: *Deleted

## 2013-01-07 ENCOUNTER — Ambulatory Visit (INDEPENDENT_AMBULATORY_CARE_PROVIDER_SITE_OTHER): Payer: Medicare Other | Admitting: *Deleted

## 2013-01-07 ENCOUNTER — Other Ambulatory Visit: Payer: Self-pay | Admitting: Internal Medicine

## 2013-01-07 ENCOUNTER — Encounter: Payer: Self-pay | Admitting: Internal Medicine

## 2013-01-07 DIAGNOSIS — I498 Other specified cardiac arrhythmias: Secondary | ICD-10-CM | POA: Diagnosis not present

## 2013-01-07 DIAGNOSIS — Z950 Presence of cardiac pacemaker: Secondary | ICD-10-CM

## 2013-01-07 DIAGNOSIS — I495 Sick sinus syndrome: Secondary | ICD-10-CM

## 2013-01-11 LAB — REMOTE PACEMAKER DEVICE
AL IMPEDENCE PM: 407 Ohm
ATRIAL PACING PM: 98
BAMS-0001: 150 {beats}/min
RV LEAD AMPLITUDE: 22.4 mv
RV LEAD IMPEDENCE PM: 451 Ohm
RV LEAD THRESHOLD: 0.625 V

## 2013-01-21 ENCOUNTER — Encounter: Payer: Self-pay | Admitting: *Deleted

## 2013-01-24 ENCOUNTER — Telehealth: Payer: Self-pay | Admitting: Physician Assistant

## 2013-01-24 NOTE — Telephone Encounter (Signed)
Pt's daughter called to discuss her mother's blood pressure. Brooke Rios BP has been spiking in the evenings up to 212/104 at times. BP usually runs 150-160's, but has also been as low as 114 on some occasions. Has h/o pacemaker and renal insufficiency as well. The patient reportedly called Wednesday night for elevated BP and on-call provider told her to take extra 1/2 of benazepril. She was told to call the office the following day for an appt, but she felt well on Thursday so did not call. The office was closed yesterday so could not get appt yesterday. The daughter is wondering what to do in the meantime if BP spikes again. Given her renal insufficiency, I told them to continue current Toprol 50mg  daily and Benazepril 20mg  daily, but to take an extra 1/2 Toprol in the evening if SBP >160. If at any time the patient develops symptoms such as visual changes, stroke symptoms, CP, SOB, or anything else concerning to her or her daughter, she was instructed to proceed to ER. Her daughter plans to call for an appt to be seen this week. They will keep a written BP log. She verbalized understanding of instructions.

## 2013-02-02 ENCOUNTER — Ambulatory Visit: Payer: Medicare Other | Admitting: Nurse Practitioner

## 2013-02-02 ENCOUNTER — Other Ambulatory Visit: Payer: Self-pay

## 2013-02-02 DIAGNOSIS — Z1231 Encounter for screening mammogram for malignant neoplasm of breast: Secondary | ICD-10-CM

## 2013-02-04 ENCOUNTER — Ambulatory Visit (INDEPENDENT_AMBULATORY_CARE_PROVIDER_SITE_OTHER): Payer: Medicare Other | Admitting: Nurse Practitioner

## 2013-02-04 ENCOUNTER — Encounter: Payer: Self-pay | Admitting: Nurse Practitioner

## 2013-02-04 VITALS — BP 118/76 | HR 66 | Ht 66.0 in | Wt 178.0 lb

## 2013-02-04 DIAGNOSIS — I10 Essential (primary) hypertension: Secondary | ICD-10-CM

## 2013-02-04 LAB — BASIC METABOLIC PANEL
BUN: 27 mg/dL — ABNORMAL HIGH (ref 6–23)
CO2: 28 mEq/L (ref 19–32)
Calcium: 9.4 mg/dL (ref 8.4–10.5)
Chloride: 102 mEq/L (ref 96–112)
Creatinine, Ser: 1.7 mg/dL — ABNORMAL HIGH (ref 0.4–1.2)
GFR: 30.97 mL/min — ABNORMAL LOW (ref >60.00)
Glucose, Bld: 176 mg/dL — ABNORMAL HIGH (ref 70–99)
Potassium: 4.3 mEq/L (ref 3.5–5.1)
Sodium: 136 mEq/L (ref 135–145)

## 2013-02-04 LAB — CBC WITH DIFFERENTIAL/PLATELET
Basophils Absolute: 0 10*3/uL (ref 0.0–0.1)
Basophils Relative: 0.5 % (ref 0.0–3.0)
Eosinophils Absolute: 0.1 10*3/uL (ref 0.0–0.7)
Eosinophils Relative: 1.5 % (ref 0.0–5.0)
HCT: 36.8 % (ref 36.0–46.0)
Hemoglobin: 12.5 g/dL (ref 12.0–15.0)
Lymphocytes Relative: 24.4 % (ref 12.0–46.0)
Lymphs Abs: 1.4 10*3/uL (ref 0.7–4.0)
MCHC: 34 g/dL (ref 30.0–36.0)
MCV: 82.8 fl (ref 78.0–100.0)
Monocytes Absolute: 0.2 10*3/uL (ref 0.1–1.0)
Monocytes Relative: 3.1 % (ref 3.0–12.0)
Neutro Abs: 4.1 10*3/uL (ref 1.4–7.7)
Neutrophils Relative %: 70.5 % (ref 43.0–77.0)
Platelets: 249 10*3/uL (ref 150.0–400.0)
RBC: 4.45 Mil/uL (ref 3.87–5.11)
RDW: 14.3 % (ref 11.5–14.6)
WBC: 5.8 10*3/uL (ref 4.5–10.5)

## 2013-02-04 NOTE — Progress Notes (Signed)
Brooke Rios Date of Birth: Jan 09, 1925 Medical Record #[id number]  History of Present Illness: Brooke Rios is seen today for a work in visit. She is seen for Dr. Sw[place]. She is 79 years old. She has SSS with pacemaker in place. Has had PAF, HTN, HLD and moderate 3 vessel CAD per cath back in 2011. Last seen here in November and felt to be doing ok.   Daughter called on call service last week with reports of BP spiking up. This appointment was thus made with me.   She comes in today. She is here with her daughter. She notes that she had been "pigging out" on salt. BP went up. She had some headache. This lasted for like 3 to 4 days, then with changing her diet, it resolved on its own. Now doing well. No chest pain. Stays active. Not short of breath. No swelling. Otherwise has done ok.   Current Outpatient Prescriptions on File Prior to Visit  Medication Sig Dispense Refill  . acetaminophen ([employer] ARTHRITIS PAIN RELIEF) 650 MG CR tablet Take 650 mg by mouth every 8 (eight) hours as needed.      . benazepril (LOTENSIN) 40 MG tablet TAKE ONE-HALF (1/2) TABLET (20 MG TOTAL) DAILY  90 tablet  2  . fish oil-omega-3 fatty acids 1000 MG capsule Take 2 g by mouth daily.        . isosorbide mononitrate (IMDUR) 30 MG 24 hr tablet Take 1 tablet (30 mg total) by mouth daily.  90 tablet  3  . metoprolol succinate (TOPROL-XL) 50 MG 24 hr tablet TAKE 1 TABLET (50 MG TOTAL) DAILY  90 tablet  2  . nitroGLYCERIN (NITROSTAT) 0.4 MG SL tablet Place 1 tablet (0.4 mg total) under the tongue every 5 (five) minutes as needed. Per cards.  25 tablet  11  . PRADAXA 150 MG CAPS TAKE 1 CAPSULE (150 MG TOTAL) EVERY 12 HOURS  3 capsule  2   No current facility-administered medications on file prior to visit.    Allergies  Allergen Reactions  . Amlodipine Besylate   . Diltiazem Hcl   . Irbesartan   . Nifedipine   . Codeine     nausea    Past Medical History  Diagnosis Date  . Hypertension   . Diverticulosis of colon    . Osteoarthritis   . Colonic polyp   . Sick sinus syndrome     with paroxysmal atrial fibrillation  . Hyperlipidemia   . Dyslipidemia   . Coronary artery disease     Moderate three vessel obstructive coronary disease  . Vertigo   . Chronic kidney disease 2011    creat 1.5     Past Surgical History  Procedure Laterality Date  . Cholecystectomy      age 23  . Abdominal hysterectomy    . Oophorectomy      bilat for fibroids @ age 3, anemia pre op due to dysfunctional menses yo  . Tonsillectomy    . Breast biopsy      x 1  . Appendectomy      done at [facility]  . Breast lumpectomy  2009    Dr Jamey Ripa  . Paf induced mi, s/p pacer for brady-tach syndrome  04/2010    Dr.jordan  . Other surgical history  hysterectomy  . Cholecystectomy    . Breast biopsy    . Pacemaker insertion  2011    History  Smoking status  . Never Smoker   Smokeless tobacco  .  Not on file    History  Alcohol Use No    Family History  Problem Relation Age of Onset  . Hyperlipidemia Father   . Stroke Father     TIA,CVA  . Heart failure Father   . Cancer Mother     ? renal primary  . Breast cancer Sister     11/08  . Breast cancer Maternal Aunt   . Colon cancer Maternal Aunt   . Breast cancer Daughter     lumpectomy 2007;mastectomy 2013  . Heart disease Brother     pacer    Review of Systems: The review of systems is per the HPI.  All other systems were reviewed and are negative.  Physical Exam: BP 118/76  Pulse 66  Ht 5\' 6"  (1.676 m)  Wt 178 lb (80.74 kg)  BMI 28.74 kg/m2 Patient is very pleasant and in no acute distress. Skin is warm and dry. Color is normal.  HEENT is unremarkable. Normocephalic/atraumatic. PERRL. Sclera are nonicteric. Neck is supple. No masses. No JVD. Lungs are clear. Cardiac exam shows a regular rate and rhythm. Abdomen is soft. Extremities are without edema. Gait and ROM are intact. No gross neurologic deficits noted.  LABORATORY DATA:  Lab Results    Component Value Date   WBC 4.0* 06/12/2012   HGB 12.9 06/12/2012   HCT 39.4 06/12/2012   PLT 183.0 06/12/2012   GLUCOSE 99 06/12/2012   CHOL 209* 06/12/2012   TRIG 279.0* 06/12/2012   HDL 36.60* 06/12/2012   LDLDIRECT 114.0 06/12/2012   LDLCALC  Value: 114        Total Cholesterol/HDL:CHD Risk Coronary Heart Disease Risk Table                     Men   Women  1/2 Average Risk   3.4   3.3  Average Risk       5.0   4.4  2 X Average Risk   9.6   7.1  3 X Average Risk  23.4   11.0        Use the calculated Patient Ratio above and the CHD Risk Table to determine the patient's CHD Risk.        ATP III CLASSIFICATION (LDL):  <100     mg/dL   Optimal  086-578  mg/dL   Near or Above                    Optimal  130-159  mg/dL   Borderline  469-629  mg/dL   High  >528     mg/dL   Very High* 02/18/3243   ALT 18 06/12/2012   AST 21 06/12/2012   NA 139 06/12/2012   K 4.4 06/12/2012   CL 102 06/12/2012   CREATININE 1.5* 06/12/2012   BUN 24* 06/12/2012   CO2 27 06/12/2012   TSH 3.21 06/12/2012   INR 0.97 04/23/2010   HGBA1C 6.2 06/12/2012   MICROALBUR 0.2 07/01/2007    Assessment / Plan:  1. HTN - labile control - most likely related to her salt use - now resolved with restriction of her sodium. She will continue to monitor.   2. CAD - moderate 3VD per cath back in 2011 - no chest pain.   3. HLD - on fish oil only  4. SSS/PAF - with pacemaker in place - followed by Dr. Ladona Ridgel - managed with rate control and on chronic Pradaxa. Will check some baseline labs today in  follow up.   We will see her back as planned in June with Dr. Sw[place].   Patient is agreeable to this plan and will call if any problems develop in the interim.   Rosalio Macadamia, RN, ANP-C [facility] [address] [place], [place]  [address]

## 2013-02-04 NOTE — Patient Instructions (Signed)
Keep a check on your blood pressure  Let's check some labs today  Stay active  Call the [facility] office at [phone] if you have any questions, problems or concerns.

## 2013-02-06 ENCOUNTER — Ambulatory Visit: Payer: Medicare Other | Admitting: Nurse Practitioner

## 2013-02-17 DIAGNOSIS — N390 Urinary tract infection, site not specified: Secondary | ICD-10-CM

## 2013-02-17 DIAGNOSIS — B9620 Unspecified Escherichia coli [E. coli] as the cause of diseases classified elsewhere: Secondary | ICD-10-CM

## 2013-02-17 HISTORY — DX: Unspecified Escherichia coli (E. coli) as the cause of diseases classified elsewhere: B96.20

## 2013-02-17 HISTORY — DX: Urinary tract infection, site not specified: N39.0

## 2013-02-20 ENCOUNTER — Emergency Department (HOSPITAL_COMMUNITY)
Admission: EM | Admit: 2013-02-20 | Discharge: 2013-02-21 | Disposition: A | Discharge status: Home / Self Care | Payer: Medicare Other | Attending: Emergency Medicine | Admitting: Emergency Medicine

## 2013-02-20 ENCOUNTER — Encounter (HOSPITAL_COMMUNITY): Payer: Self-pay | Admitting: Emergency Medicine

## 2013-02-20 DIAGNOSIS — Z860100 Personal history of colon polyps, unspecified: Secondary | ICD-10-CM | POA: Insufficient documentation

## 2013-02-20 DIAGNOSIS — E785 Hyperlipidemia, unspecified: Secondary | ICD-10-CM | POA: Insufficient documentation

## 2013-02-20 DIAGNOSIS — R350 Frequency of micturition: Secondary | ICD-10-CM | POA: Insufficient documentation

## 2013-02-20 DIAGNOSIS — I129 Hypertensive chronic kidney disease with stage 1 through stage 4 chronic kidney disease, or unspecified chronic kidney disease: Secondary | ICD-10-CM | POA: Insufficient documentation

## 2013-02-20 DIAGNOSIS — N189 Chronic kidney disease, unspecified: Secondary | ICD-10-CM | POA: Diagnosis not present

## 2013-02-20 DIAGNOSIS — Z79899 Other long term (current) drug therapy: Secondary | ICD-10-CM | POA: Insufficient documentation

## 2013-02-20 DIAGNOSIS — Z8739 Personal history of other diseases of the musculoskeletal system and connective tissue: Secondary | ICD-10-CM | POA: Insufficient documentation

## 2013-02-20 DIAGNOSIS — I2510 Atherosclerotic heart disease of native coronary artery without angina pectoris: Secondary | ICD-10-CM | POA: Insufficient documentation

## 2013-02-20 DIAGNOSIS — Z8679 Personal history of other diseases of the circulatory system: Secondary | ICD-10-CM | POA: Insufficient documentation

## 2013-02-20 DIAGNOSIS — Z950 Presence of cardiac pacemaker: Secondary | ICD-10-CM | POA: Diagnosis not present

## 2013-02-20 DIAGNOSIS — N390 Urinary tract infection, site not specified: Secondary | ICD-10-CM | POA: Diagnosis not present

## 2013-02-20 DIAGNOSIS — Z8719 Personal history of other diseases of the digestive system: Secondary | ICD-10-CM | POA: Insufficient documentation

## 2013-02-20 LAB — URINALYSIS, ROUTINE W REFLEX MICROSCOPIC
Glucose, UA: NEGATIVE mg/dL
Ketones, ur: NEGATIVE mg/dL
Nitrite: POSITIVE — AB
Protein, ur: 30 mg/dL — AB

## 2013-02-20 LAB — POCT I-STAT, CHEM 8
Creatinine, Ser: 1.7 mg/dL — ABNORMAL HIGH (ref 0.50–1.10)
HCT: 32 % — ABNORMAL LOW (ref 36.0–46.0)
Hemoglobin: 10.9 g/dL — ABNORMAL LOW (ref 12.0–15.0)
Potassium: 4.2 mEq/L (ref 3.5–5.1)
Sodium: 135 mEq/L (ref 135–145)

## 2013-02-20 LAB — CBC
Hemoglobin: 11.3 g/dL — ABNORMAL LOW (ref 12.0–15.0)
MCHC: 33.5 g/dL (ref 30.0–36.0)

## 2013-02-20 LAB — URINE MICROSCOPIC-ADD ON

## 2013-02-20 MED ORDER — DEXTROSE 5 % IV SOLN
1.0000 g | Freq: Once | INTRAVENOUS | Status: AC
  Administered 2013-02-20: 1 g via INTRAVENOUS
  Filled 2013-02-20: qty 10

## 2013-02-20 MED ORDER — SODIUM CHLORIDE 0.9 % IV SOLN
INTRAVENOUS | Status: DC
  Administered 2013-02-20: 125 mL/h via INTRAVENOUS

## 2013-02-20 MED ORDER — ONDANSETRON HCL 4 MG PO TABS: 4.0000 mg | ORAL_TABLET | Freq: Four times a day (QID) | ORAL | Status: DC

## 2013-02-20 MED ORDER — CEFPODOXIME PROXETIL 100 MG PO TABS: 100.0000 mg | ORAL_TABLET | Freq: Two times a day (BID) | ORAL | Status: DC

## 2013-02-20 NOTE — ED Notes (Signed)
Pt. Is from home accompanied by daughter with complaint of fever of 103'F oral , pt. Claimed that she "feels like having urinary tract infection due to frequency of urination". Pt. Also reported that she was travelling for 6 hrs yesterday and never used the bathroom. Alert and oriented x3, denies any pain.

## 2013-02-20 NOTE — ED Provider Notes (Signed)
History     CSN: [id number]  Arrival date & time 02/20/13  2222   First MD Initiated Contact with Patient 02/20/13 2259      Chief Complaint  Patient presents with  . Fever  . Urinary Tract Infection    (Consider location/radiation/quality/duration/timing/severity/associated sxs/prior treatment) HPI  History provided by patient and her daughter bedside. Woke up feeling well this morning, around 5 PM developed fever and chills with body aches. She had associated mild headache and measured temperature of 103 at home. She has noticed some urinary frequency today without history of UTI. No nausea vomiting. No back pain. No abdominal pain. No dysuria. No hematuria. Feeling somewhat better now after Tylenol. Symptoms moderate in severity. No history of same. No chest pain, shortness of breath, cough or rash. Past Medical History  Diagnosis Date  . Hypertension   . Diverticulosis of colon   . Osteoarthritis   . Colonic polyp   . Sick sinus syndrome     with paroxysmal atrial fibrillation  . Hyperlipidemia   . Dyslipidemia   . Coronary artery disease     Moderate three vessel obstructive coronary disease  . Vertigo   . Chronic kidney disease 2011    creat 1.5     Past Surgical History  Procedure Laterality Date  . Cholecystectomy      age 41  . Abdominal hysterectomy    . Oophorectomy      bilat for fibroids @ age 21, anemia pre op due to dysfunctional menses yo  . Tonsillectomy    . Breast biopsy      x 1  . Appendectomy      done at [facility]  . Breast lumpectomy  2009    Dr Jamey Ripa  . Paf induced mi, s/p pacer for brady-tach syndrome  04/2010    Dr.jordan  . Other surgical history  hysterectomy  . Cholecystectomy    . Breast biopsy    . Pacemaker insertion  2011    Family History  Problem Relation Age of Onset  . Hyperlipidemia Father   . Stroke Father     TIA,CVA  . Heart failure Father   . Cancer Mother     ? renal primary  . Breast cancer Sister     11/08  .  Breast cancer Maternal Aunt   . Colon cancer Maternal Aunt   . Breast cancer Daughter     lumpectomy 2007;mastectomy 2013  . Heart disease Brother     pacer    History  Substance Use Topics  . Smoking status: Never Smoker   . Smokeless tobacco: Not on file  . Alcohol Use: No    OB History   Grav Para Term Preterm Abortions TAB SAB Ect Mult Living                  Review of Systems  Constitutional: Positive for fever and chills.  HENT: Negative for neck pain and neck stiffness.   Eyes: Negative for pain.  Respiratory: Negative for shortness of breath.   Cardiovascular: Negative for chest pain.  Gastrointestinal: Negative for abdominal pain.  Genitourinary: Positive for frequency. Negative for dysuria.  Musculoskeletal: Negative for back pain.  Skin: Negative for rash.  Neurological: Negative for headaches.  All other systems reviewed and are negative.    Allergies  Codeine  Home Medications   Current Outpatient Rx  Name  Route  Sig  Dispense  Refill  . acetaminophen (TYLENOL) 650 MG CR tablet  Oral   Take 650 mg by mouth every 8 (eight) hours as needed for pain or fever.         . benazepril (LOTENSIN) 40 MG tablet   Oral   Take 20 mg by mouth every morning.         . dabigatran (PRADAXA) 150 MG CAPS   Oral   Take 150 mg by mouth every 12 (twelve) hours.         . fish oil-omega-3 fatty acids 1000 MG capsule   Oral   Take 1 g by mouth every morning.          . isosorbide mononitrate (IMDUR) 30 MG 24 hr tablet   Oral   Take 30 mg by mouth every morning.         . metoprolol succinate (TOPROL-XL) 50 MG 24 hr tablet   Oral   Take 50 mg by mouth every morning. Take with or immediately following a meal.         . nitroGLYCERIN (NITROSTAT) 0.4 MG SL tablet   Sublingual   Place 0.4 mg under the tongue every 5 (five) minutes as needed for chest pain.           BP 140/64  Pulse 83  Temp(Src) 101.2 F (38.4 C) (Oral)  Resp 20  SpO2  94%  Physical Exam  Constitutional: She is oriented to person, place, and time. She appears well-developed and well-nourished.  HENT:  Head: Normocephalic and atraumatic.  Mouth/Throat: Oropharynx is clear and moist.  Eyes: EOM are normal. Pupils are equal, round, and reactive to light.  Neck: Neck supple.  Cardiovascular: Normal rate, regular rhythm and intact distal pulses.   Pulmonary/Chest: Effort normal and breath sounds normal. No respiratory distress.  Abdominal: Soft. Bowel sounds are normal. She exhibits no distension. There is no tenderness. There is no rebound and no guarding.  No CVA tenderness  Musculoskeletal: Normal range of motion. She exhibits no edema.  Neurological: She is alert and oriented to person, place, and time.  Skin: Skin is warm and dry.    ED Course  Procedures (including critical care time)  Results for orders placed during the hospital encounter of 02/20/13  URINALYSIS, ROUTINE W REFLEX MICROSCOPIC      Result Value Range   Color, Urine YELLOW  YELLOW   APPearance CLOUDY (*) CLEAR   Specific Gravity, Urine 1.016  1.005 - 1.030   pH 5.5  5.0 - 8.0   Glucose, UA NEGATIVE  NEGATIVE mg/dL   Hgb urine dipstick LARGE (*) NEGATIVE   Bilirubin Urine NEGATIVE  NEGATIVE   Ketones, ur NEGATIVE  NEGATIVE mg/dL   Protein, ur 30 (*) NEGATIVE mg/dL   Urobilinogen, UA 0.2  0.0 - 1.0 mg/dL   Nitrite POSITIVE (*) NEGATIVE   Leukocytes, UA LARGE (*) NEGATIVE  URINE MICROSCOPIC-ADD ON      Result Value Range   Squamous Epithelial / LPF FEW (*) RARE   WBC, UA TOO NUMEROUS TO COUNT  <3 WBC/hpf   RBC / HPF 0-2  <3 RBC/hpf   Bacteria, UA MANY (*) RARE  POCT I-STAT, CHEM 8      Result Value Range   Sodium 135  135 - 145 mEq/L   Potassium 4.2  3.5 - 5.1 mEq/L   Chloride 105  96 - 112 mEq/L   BUN 28 (*) 6 - 23 mg/dL   Creatinine, Ser 4.09 (*) 0.50 - 1.10 mg/dL   Glucose, Bld 811 (*) 70 -  99 mg/dL   Calcium, Ion 4.09 (*) 1.13 - 1.30 mmol/L   TCO2 22  0 - 100  mmol/L   Hemoglobin 10.9 (*) 12.0 - 15.0 g/dL   HCT 81.1 (*) 91.4 - 78.2 %   IV fluids. IV antibiotics. Serial evaluations with improving condition.  Patient feels comfortable with plan discharge home outpatient followup. Prescription for Vantin provided. Urine culture pending. Return precautions provided.  MDM  UTI elderly female with fever. Hypotension. No nausea vomiting or back pain. Labs obtained and reviewed, has known renal insufficiency.   Improved with medications as above. Vital signs and nursing notes reviewed and considered.        Sunnie Nielsen, MD 02/21/13 [phone]

## 2013-02-23 ENCOUNTER — Telehealth: Payer: Self-pay | Admitting: Internal Medicine

## 2013-02-23 LAB — URINE CULTURE

## 2013-02-23 NOTE — Telephone Encounter (Signed)
[facility] Triage Call Report Triage Record Num: [id number] Operator: Kelle Darting Patient Name: Brooke Rios Call Date & Time: 02/21/2013 12:37:28AM Patient Phone: [phone] PCP: Marga Melnick Patient Gender: Female PCP Fax : [phone] Patient DOB: [birth year] Practice Name: [facility] Reason for Call: Caller: Judy(daughter); PCP: Marga Melnick; CB#: [phone]; Call regarding Fever; Temp. was 103; Note: States she has already been to the ED and treated for a UTI, states she is feeling better after that and the Tylenol; daughter has told her to drink more water, had a long trip yesterday of riding and not drinking enough; has a Rx to fill for treatment; will follow-up in office; Note to office. Protocol(s) Used: Office Note Recommended Outcome per Protocol: Information Noted and Sent to Office Reason for Outcome: Caller information to office Care Advice: ~

## 2013-02-23 NOTE — Telephone Encounter (Signed)
Noted, seen in ED on 02/20/13 for UTI, patient with treatment

## 2013-02-24 ENCOUNTER — Telehealth (HOSPITAL_COMMUNITY): Payer: Self-pay | Admitting: Emergency Medicine

## 2013-02-24 ENCOUNTER — Ambulatory Visit
Admission: RE | Admit: 2013-02-24 | Discharge: 2013-02-24 | Disposition: A | Discharge status: Home / Self Care | Payer: Medicare Other | Source: Ambulatory Visit

## 2013-02-24 DIAGNOSIS — Z1231 Encounter for screening mammogram for malignant neoplasm of breast: Secondary | ICD-10-CM

## 2013-02-25 ENCOUNTER — Encounter: Payer: Self-pay | Admitting: Internal Medicine

## 2013-02-25 ENCOUNTER — Ambulatory Visit (INDEPENDENT_AMBULATORY_CARE_PROVIDER_SITE_OTHER): Payer: Medicare Other | Admitting: Internal Medicine

## 2013-02-25 VITALS — BP 124/70 | HR 93 | Temp 97.5°F | Wt 178.0 lb

## 2013-02-25 DIAGNOSIS — N390 Urinary tract infection, site not specified: Secondary | ICD-10-CM

## 2013-02-25 LAB — POCT URINALYSIS DIPSTICK
Glucose, UA: NEGATIVE
Leukocytes, UA: NEGATIVE
Nitrite, UA: NEGATIVE
Urobilinogen, UA: 0.2

## 2013-02-25 NOTE — Addendum Note (Signed)
Addended by: Maurice Small on: 02/25/2013 02:58 PM   Modules accepted: Orders

## 2013-02-25 NOTE — Progress Notes (Signed)
  Subjective:    Patient ID: Brooke Rios, female    DOB: 08-07-1925, 79 y.o.   MRN: [id number]  HPI  She was seen in the emergency room for/4/14 for fever up to 103 associated with shaking chills. Urine culture revealed greater than 100,000 Escherichia coli which was sensitive to all antibiotics tested. She was placed on Cefpodoxime 100 mg twice a day. Since discharge she's had no additional fever, chills, or sweats.  She questions whether the antibiotic has impaired her sleep. She is sleeping for short periods of time. She is having nocturia 3-4X/ night prior to the infection she was having nocturia 2 times per night.  She also had urinary symptoms in July 2013. At that time culture revealed greater than 100,000 colonies of multiple bacteria morphotypes.   She has no history of any genitourinary anomalies or disease.   Review of Systems  At this time she denies dysuria, pyuria, or hematuria; but she did not have these symptoms with the onset of fever.     Objective:   Physical Exam General appearance is one of good health and nourishment w/o distress.  Eyes: No conjunctival inflammation or scleral icterus is present.  Oral exam: Dental hygiene is good; lips and gums are healthy appearing.There is no oropharyngeal erythema or exudate noted.   Heart:  Normal rate and regular rhythm. S1 and S2 normal without gallop, click,or  rub .Grade 1/6 systolic murmur  Lungs:Chest clear to auscultation; no wheezes, rhonchi,rales ,or rubs present.No increased work of breathing.   Abdomen: bowel sounds normal, soft and non-tender without masses, organomegaly or hernias noted.  No guarding or rebound . No flank tenderness  Skin:Warm & dry.  Intact without suspicious lesions or rashes ; no jaundice or tenting  Lymphatic: No lymphadenopathy is noted about the head, neck, axilla             Assessment & Plan:  #1 E coli UTI Plan: complete full course of antibiotics

## 2013-02-25 NOTE — Patient Instructions (Addendum)
Please obtain final  sensitivity results for  ALL urine cultures.Share results with all  medical staff seen if having urinary symptoms. Drink as much nondairy fluids as possible. Avoid spicy foods or alcohol as  these may aggravate the bladder. Do not take decongestants. Avoid narcotics if possible.

## 2013-02-26 ENCOUNTER — Ambulatory Visit: Payer: Medicare Other | Admitting: Internal Medicine

## 2013-03-10 ENCOUNTER — Telehealth: Payer: Self-pay | Admitting: Cardiology

## 2013-03-10 NOTE — Telephone Encounter (Deleted)
error 

## 2013-04-09 ENCOUNTER — Encounter: Payer: Self-pay | Admitting: Cardiology

## 2013-04-09 ENCOUNTER — Ambulatory Visit (INDEPENDENT_AMBULATORY_CARE_PROVIDER_SITE_OTHER): Payer: Medicare Other | Admitting: Cardiology

## 2013-04-09 VITALS — BP 124/80 | HR 86 | Ht 66.0 in | Wt 177.0 lb

## 2013-04-09 DIAGNOSIS — I4891 Unspecified atrial fibrillation: Secondary | ICD-10-CM

## 2013-04-09 DIAGNOSIS — I10 Essential (primary) hypertension: Secondary | ICD-10-CM | POA: Diagnosis not present

## 2013-04-09 DIAGNOSIS — I2510 Atherosclerotic heart disease of native coronary artery without angina pectoris: Secondary | ICD-10-CM | POA: Diagnosis not present

## 2013-04-09 DIAGNOSIS — I495 Sick sinus syndrome: Secondary | ICD-10-CM | POA: Diagnosis not present

## 2013-04-09 MED ORDER — ISOSORBIDE MONONITRATE ER 30 MG PO TB24: 30.0000 mg | ORAL_TABLET | Freq: Every morning | ORAL | Status: DC

## 2013-04-09 MED ORDER — METOPROLOL SUCCINATE ER 50 MG PO TB24: 50.0000 mg | ORAL_TABLET | Freq: Every morning | ORAL | Status: DC

## 2013-04-09 NOTE — Progress Notes (Signed)
Brooke Rios Date of Birth: September 12, 1925 Medical Record #[id number]  History of Present Illness: Brooke Rios is seen today for a followup visit. She has SSS with pacemaker in place. Has had PAF, HTN, HLD and moderate 3 vessel CAD per cath back in 2011. She was seen in March with a spike in her blood pressure that was related to dietary indiscretion. She quit eating salty foods in her blood pressure normalized. She was treated for urinary tract infection in April. She reports her blood pressures have been excellent at home. She feels well without any symptoms of chest pain, shortness of breath, edema, or palpitations.  Current Outpatient Prescriptions on File Prior to Visit  Medication Sig Dispense Refill  . benazepril (LOTENSIN) 40 MG tablet Take 20 mg by mouth every morning.      . cefpodoxime (VANTIN) 100 MG tablet Take 1 tablet (100 mg total) by mouth 2 (two) times daily.  14 tablet  0  . [facility] ACETAMINOPHEN PO Take 650 mg by mouth as needed.      . dabigatran (PRADAXA) 150 MG CAPS Take 150 mg by mouth every 12 (twelve) hours.      . fish oil-omega-3 fatty acids 1000 MG capsule Take 1 g by mouth every morning.       . nitroGLYCERIN (NITROSTAT) 0.4 MG SL tablet Place 0.4 mg under the tongue every 5 (five) minutes as needed for chest pain.       No current facility-administered medications on file prior to visit.    Allergies  Allergen Reactions  . Cefpodoxime     Nocturia & disturbed sleep  . Codeine     nausea    Past Medical History  Diagnosis Date  . Hypertension   . Diverticulosis of colon   . Osteoarthritis   . Colonic polyp   . Sick sinus syndrome     with paroxysmal atrial fibrillation  . Hyperlipidemia   . Dyslipidemia   . Coronary artery disease     Moderate three vessel obstructive coronary disease  . Vertigo   . Chronic kidney disease 2011    creat 1.5   . E. coli UTI 02/2013    uniformly sensitive    Past Surgical History  Procedure Laterality Date  .  Cholecystectomy      age 13  . Abdominal hysterectomy    . Oophorectomy      bilat for fibroids @ age 57, anemia pre op due to dysfunctional menses yo  . Tonsillectomy    . Breast biopsy      x 1  . Appendectomy      done at [facility]  . Breast lumpectomy  2009    Dr Jamey Ripa  . Paf induced mi, s/p pacer for brady-tach syndrome  04/2010    Dr.Denver Bentson  . Other surgical history  hysterectomy  . Cholecystectomy    . Breast biopsy    . Pacemaker insertion  2011    History  Smoking status  . Never Smoker   Smokeless tobacco  . Not on file    History  Alcohol Use No    Family History  Problem Relation Age of Onset  . Hyperlipidemia Father   . Stroke Father     TIA,CVA  . Heart failure Father   . Cancer Mother     ? renal primary  . Breast cancer Sister     11/08  . Breast cancer Maternal Aunt   . Colon cancer Maternal Aunt   . Breast  cancer Daughter     lumpectomy 2007;mastectomy 2013  . Heart disease Brother     pacer    Review of Systems: The review of systems is per the HPI.  All other systems were reviewed and are negative.  Physical Exam: BP 124/80  Pulse 86  Ht 5\' 6"  (1.676 m)  Wt 177 lb (80.287 kg)  BMI 28.58 kg/m2  SpO2 97% Patient is very pleasant and in no acute distress. Skin is warm and dry. Color is normal.  HEENT is unremarkable. Normocephalic/atraumatic. PERRL. Sclera are nonicteric. Neck is supple. No masses. No JVD. Lungs are clear. Cardiac exam shows a regular rate and rhythm. Abdomen is soft. Extremities are without edema. Gait and ROM are intact. No gross neurologic deficits noted.  LABORATORY DATA:  Lab Results  Component Value Date   WBC 7.2 02/20/2013   HGB 10.9* 02/20/2013   HCT 32.0* 02/20/2013   PLT 115* 02/20/2013   GLUCOSE 139* 02/20/2013   CHOL 209* 06/12/2012   TRIG 279.0* 06/12/2012   HDL 36.60* 06/12/2012   LDLDIRECT 114.0 06/12/2012   LDLCALC  Value: 114        Total Cholesterol/HDL:CHD Risk Coronary Heart Disease Risk Table                      Men   Women  1/2 Average Risk   3.4   3.3  Average Risk       5.0   4.4  2 X Average Risk   9.6   7.1  3 X Average Risk  23.4   11.0        Use the calculated Patient Ratio above and the CHD Risk Table to determine the patient's CHD Risk.        ATP III CLASSIFICATION (LDL):  <100     mg/dL   Optimal  161-096  mg/dL   Near or Above                    Optimal  130-159  mg/dL   Borderline  045-409  mg/dL   High  >811     mg/dL   Very High* 07/20/4781   ALT 18 06/12/2012   AST 21 06/12/2012   NA 135 02/20/2013   K 4.2 02/20/2013   CL 105 02/20/2013   CREATININE 1.70* 02/20/2013   BUN 28* 02/20/2013   CO2 28 02/04/2013   TSH 3.21 06/12/2012   INR 0.97 04/23/2010   HGBA1C 6.2 06/12/2012   MICROALBUR 0.2 07/01/2007    Assessment / Plan:  1. HTN - excellent control. Continue current therapy.   2. CAD - moderate 3VD per cath back in 2011 - no chest pain.   3. HLD - on fish oil only. Last lipid panel in July of 2013. Is scheduled for followup with Dr. Alwyn Ren for complete physical this summer.  4. SSS/PAF - with pacemaker in place - followed by Dr. Ladona Ridgel - managed with rate control and on chronic Pradaxa.

## 2013-04-09 NOTE — Patient Instructions (Signed)
Continue your current therapy  I will see you in 6 months.   

## 2013-04-14 ENCOUNTER — Ambulatory Visit (INDEPENDENT_AMBULATORY_CARE_PROVIDER_SITE_OTHER): Payer: Medicare Other | Admitting: *Deleted

## 2013-04-14 ENCOUNTER — Encounter: Payer: Self-pay | Admitting: Internal Medicine

## 2013-04-14 DIAGNOSIS — I4891 Unspecified atrial fibrillation: Secondary | ICD-10-CM

## 2013-04-14 DIAGNOSIS — Z950 Presence of cardiac pacemaker: Secondary | ICD-10-CM

## 2013-04-20 LAB — REMOTE PACEMAKER DEVICE
AL IMPEDENCE PM: 412 Ohm
AL THRESHOLD: 0.5 V
RV LEAD IMPEDENCE PM: 527 Ohm
RV LEAD THRESHOLD: 0.625 V

## 2013-05-20 ENCOUNTER — Ambulatory Visit: Payer: Medicare Other | Admitting: Internal Medicine

## 2013-05-21 ENCOUNTER — Ambulatory Visit (INDEPENDENT_AMBULATORY_CARE_PROVIDER_SITE_OTHER): Payer: Medicare Other | Admitting: Internal Medicine

## 2013-05-21 ENCOUNTER — Encounter: Payer: Self-pay | Admitting: Internal Medicine

## 2013-05-21 VITALS — BP 130/82 | HR 85 | Temp 98.5°F | Wt 178.0 lb

## 2013-05-21 DIAGNOSIS — M79609 Pain in unspecified limb: Secondary | ICD-10-CM | POA: Diagnosis not present

## 2013-05-21 DIAGNOSIS — M79674 Pain in right toe(s): Secondary | ICD-10-CM

## 2013-05-21 MED ORDER — PREDNISONE 20 MG PO TABS: ORAL_TABLET | ORAL | Status: DC

## 2013-05-21 NOTE — Progress Notes (Signed)
  Subjective:    Patient ID: Brooke Rios, female    DOB: 1925/10/27, 79 y.o.   MRN: [id number]  HPI  Symptoms began 05/18/13 as pain in the great toe with radiation over the dorsum of the foot. She is unable to qualify the discomfort but states that even the sheet makes it uncomfortable. It is sore when she walks. 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  She took Tylenol arthritis over-the-counter medication with some apparent benefit but the pain recurred within hours.  She is not on hydrochlorothiazide and has no history of gout.  She is on Pradaxa.        Review of Systems  The toe has been hot, swollen, and tender.     Objective:   Physical Exam   She appears healthy well-nourished but uncomfortable due to the toe pain.  Pedal pulses are decrease but intact. No ischemic changes are noted over the feet.  Nail health appears good.  There's no significant discomfort with compression of the foot.  There is obvious erythema and edema at the base of the great toe. This is markedly tendered to even light palpation.        Assessment & Plan:  #1classic podagra or gout suggested. Clinically fracture themetatarsal bones or cellulitis is not suggested  Plan: low-dose prednisone. Uric acid level. Films if uric acid level is normal and pain persists after the prednisone.

## 2013-05-21 NOTE — Patient Instructions (Addendum)
Order for  Foot x-rays entered into  the computer; these will be performed  Monday 7/7 if no better at [address] [facility]. across from [facility]. No appointment is necessary.  Use an anti-inflammatory cream such as Aspercreme or Zostrix cream twice a day to the toe/foot as needed. In lieu of this warm moist compresses or  hot water bottle can be used. Do not apply ice .

## 2013-05-22 ENCOUNTER — Other Ambulatory Visit: Payer: Self-pay | Admitting: Internal Medicine

## 2013-05-22 DIAGNOSIS — M109 Gout, unspecified: Secondary | ICD-10-CM

## 2013-05-22 DIAGNOSIS — Z8739 Personal history of other diseases of the musculoskeletal system and connective tissue: Secondary | ICD-10-CM | POA: Insufficient documentation

## 2013-05-25 ENCOUNTER — Telehealth: Payer: Self-pay

## 2013-05-25 ENCOUNTER — Encounter: Payer: Self-pay | Admitting: *Deleted

## 2013-05-25 MED ORDER — ALLOPURINOL 100 MG PO TABS: 100.0000 mg | ORAL_TABLET | Freq: Every day | ORAL | Status: DC

## 2013-05-25 NOTE — Telephone Encounter (Signed)
RX sent per staff message sent from MD

## 2013-05-25 NOTE — Telephone Encounter (Signed)
Message copied by Maurice Small on Mon May 25, 2013  8:19 AM ------      Message from: Pecola Lawless      Created: Fri May 22, 2013  7:34 AM       Please send new  Rx for  Allopurinol 100 mg # 90  ------

## 2013-05-29 ENCOUNTER — Encounter: Payer: Self-pay | Admitting: *Deleted

## 2013-07-21 ENCOUNTER — Ambulatory Visit (INDEPENDENT_AMBULATORY_CARE_PROVIDER_SITE_OTHER): Payer: Medicare Other | Admitting: *Deleted

## 2013-07-21 DIAGNOSIS — Z950 Presence of cardiac pacemaker: Secondary | ICD-10-CM

## 2013-07-21 DIAGNOSIS — I498 Other specified cardiac arrhythmias: Secondary | ICD-10-CM

## 2013-07-31 LAB — REMOTE PACEMAKER DEVICE
AL THRESHOLD: 0.5 V
BAMS-0001: 150 {beats}/min
BATTERY VOLTAGE: 2.79 V
VENTRICULAR PACING PM: 4

## 2013-08-11 ENCOUNTER — Encounter: Payer: Self-pay | Admitting: *Deleted

## 2013-08-25 ENCOUNTER — Encounter: Payer: Self-pay | Admitting: Internal Medicine

## 2013-09-04 ENCOUNTER — Telehealth: Payer: Self-pay

## 2013-09-04 NOTE — Telephone Encounter (Signed)
Medication and allergies: done  90 day supply/mail order: none Local pharmacy: [facility] [address]   Immunizations due:  Flu,tdap,PNA,shingles  A/P:  Last:  PAP: na MMG: UTD 02/2013  Dexa: Not in file CCS: UTD 11/2003  DM: Due 11/2012 Eye Exam:    HTN: Due 05/2012 Lipids: Due 05/2012  Recent family history or surgical procedures: none   To Discuss with Provider: Finishing up 90 day abx for UTI-doing much better Daughter had double mastectomy this year but she is doing wel

## 2013-09-08 ENCOUNTER — Encounter: Payer: Self-pay | Admitting: Internal Medicine

## 2013-09-08 ENCOUNTER — Ambulatory Visit (INDEPENDENT_AMBULATORY_CARE_PROVIDER_SITE_OTHER): Payer: Medicare Other | Admitting: Internal Medicine

## 2013-09-08 ENCOUNTER — Other Ambulatory Visit: Payer: Self-pay | Admitting: Internal Medicine

## 2013-09-08 ENCOUNTER — Encounter: Payer: Self-pay | Admitting: *Deleted

## 2013-09-08 VITALS — BP 148/83 | HR 81 | Temp 98.2°F | Resp 18 | Ht 66.0 in | Wt 181.0 lb

## 2013-09-08 DIAGNOSIS — Z862 Personal history of diseases of the blood and blood-forming organs and certain disorders involving the immune mechanism: Secondary | ICD-10-CM

## 2013-09-08 DIAGNOSIS — R300 Dysuria: Secondary | ICD-10-CM | POA: Diagnosis not present

## 2013-09-08 DIAGNOSIS — I10 Essential (primary) hypertension: Secondary | ICD-10-CM | POA: Diagnosis not present

## 2013-09-08 DIAGNOSIS — R7309 Other abnormal glucose: Secondary | ICD-10-CM

## 2013-09-08 DIAGNOSIS — E785 Hyperlipidemia, unspecified: Secondary | ICD-10-CM

## 2013-09-08 DIAGNOSIS — M109 Gout, unspecified: Secondary | ICD-10-CM | POA: Diagnosis not present

## 2013-09-08 DIAGNOSIS — Z0000 Encounter for general adult medical examination without abnormal findings: Secondary | ICD-10-CM | POA: Diagnosis not present

## 2013-09-08 LAB — BASIC METABOLIC PANEL
BUN: 22 mg/dL (ref 6–23)
CO2: 26 mEq/L (ref 19–32)
Glucose, Bld: 112 mg/dL — ABNORMAL HIGH (ref 70–99)
Potassium: 4.5 mEq/L (ref 3.5–5.1)
Sodium: 139 mEq/L (ref 135–145)

## 2013-09-08 LAB — POCT URINALYSIS DIPSTICK
Bilirubin, UA: NEGATIVE
Glucose, UA: NEGATIVE
pH, UA: 6

## 2013-09-08 LAB — URIC ACID: Uric Acid, Serum: 6.1 mg/dL (ref 2.4–7.0)

## 2013-09-08 LAB — CBC WITH DIFFERENTIAL/PLATELET
Basophils Absolute: 0 10*3/uL (ref 0.0–0.1)
Eosinophils Absolute: 0.1 10*3/uL (ref 0.0–0.7)
HCT: 38.2 % (ref 36.0–46.0)
Hemoglobin: 13.1 g/dL (ref 12.0–15.0)
Lymphs Abs: 1.3 10*3/uL (ref 0.7–4.0)
MCHC: 34.2 g/dL (ref 30.0–36.0)
Monocytes Absolute: 0.3 10*3/uL (ref 0.1–1.0)
Neutro Abs: 3.3 10*3/uL (ref 1.4–7.7)
RDW: 15.4 % — ABNORMAL HIGH (ref 11.5–14.6)

## 2013-09-08 LAB — TSH: TSH: 2.92 u[IU]/mL (ref 0.35–5.50)

## 2013-09-08 LAB — HEPATIC FUNCTION PANEL
Albumin: 4.2 g/dL (ref 3.5–5.2)
Total Protein: 7.1 g/dL (ref 6.0–8.3)

## 2013-09-08 NOTE — Progress Notes (Signed)
Subjective:    Patient ID: Brooke Rios, female    DOB: 18-Jun-1925, 79 y.o.   MRN: [id number]  HPI Medicare Wellness Visit: Psychosocial and medical history were reviewed as required by Medicare (abuse, antisocial behavior risk, forearm risk). Social history: Caffeine:  2 cups tea daily , Alcohol: no , Tobacco use:no Exercise:see below Personal safety/fall risk:no Limitations of activities of daily living:no Seatbelt smoke alarm use:yes Healthcare Power of Attorney/Living Will status: in place Ophthalmologic exam status: current Hearing evaluation status:not current Orientation: Oriented X3 Memory and recall: good Math testing: good Depression/anxiety assessment: denied Foreign travel history:never Immunization status the shingles/bleeding/pneumonia/tetanus:No shingles Transfusion history:post partum 1949 Preventive health care maintenance status: Colonoscopy/BMD/mammogram/Pap as per protocol/standard care: all overdue Dental care: every year Chart reviewed and updated. Active issues reviewed and addressed.    Review of Systems She is on a modified heart healthy diet; she is not exercising due to L knee pain.  She denies chest pain, palpitations, dyspnea, or claudication.  Family history is negative for premature coronary disease . Advanced cholesterol testing not done to date. Not on  Statin due to intolerance.  She had an Escherichia coli urinary tract infection in April of this year. She believes she was on an antibiotic pill for 90 days. This will be verified with her pharmacy    Objective:   Physical Exam  Gen.:  well-nourished in appearance. Alert, appropriate and cooperative throughout exam.Appears younger than stated age  Head: Normocephalic without obvious abnormalities Eyes: No corneal or conjunctival inflammation noted. Pupils equal round reactive to light and accommodation. Fundal exam is benign without hemorrhages, exudate, papilledema. Extraocular motion intact.  Vision grossly normal with lenses Ears: External  ear exam reveals no significant lesions or deformities. Canals clear .TM scarred on Ll. Hearing is grossly normal bilaterally. Nose: External nasal exam reveals no deformity or inflammation. Nasal mucosa are pink and moist. No lesions or exudates noted.   Mouth: Oral mucosa and oropharynx reveal no lesions or exudates. Teeth in good repair. Neck: No deformities, masses, or tenderness noted. Range of motion decreased. Thyroid normal Lungs: Normal respiratory effort; chest expands symmetrically. Lungs are clear to auscultation without rales, wheezes, or increased work of breathing. Heart: Normal rate and rhythm. Normal S1 and S2. No gallop, click, or rub. S4 with Grade 1/2 systolic murmur  Abdomen: Bowel sounds normal; abdomen soft and nontender. No masses, organomegaly or hernias noted. Dullness RUQ Genitalia: As per Gyn                                  Musculoskeletal/extremities:  Accentuated curvature of upper thoracic  Spine. No clubbing, cyanosis, edema, or significant extremity  deformity noted. Range of motion decreased L knee . Effusion& crepitus L knee .Tone & strength  Normal. Joints normal . Nail health good. Able to lie down & sit up w/o help. Negative SLR bilaterally Vascular: Carotid, radial artery, dorsalis pedis and  posterior tibial pulses are full and equal. No bruits present. Neurologic: Alert and oriented x3. Deep tendon reflexes symmetrical and normal.        Skin: Intact without suspicious lesions or rashes. Lymph: No cervical, axillary lymphadenopathy present. Psych: Mood and affect are normal. Normally interactive  Assessment & Plan:  #1 Medicare Wellness Exam; criteria met ; data entered #2 Problem List/Diagnoses reviewed Plan:  Assessments made/ Orders entered  

## 2013-09-08 NOTE — Patient Instructions (Signed)

## 2013-09-09 ENCOUNTER — Encounter: Payer: Self-pay | Admitting: General Practice

## 2013-09-09 ENCOUNTER — Encounter: Payer: Self-pay | Admitting: Internal Medicine

## 2013-09-09 ENCOUNTER — Other Ambulatory Visit: Payer: Self-pay | Admitting: *Deleted

## 2013-09-09 ENCOUNTER — Telehealth: Payer: Self-pay | Admitting: *Deleted

## 2013-09-09 LAB — NMR LIPOPROFILE WITH LIPIDS
HDL Particle Number: 28.5 umol/L — ABNORMAL LOW (ref >=30.5)
HDL-C: 35 mg/dL — ABNORMAL LOW (ref >=40)
LDL Size: 19.6 nm — ABNORMAL LOW (ref >20.5)
Large HDL-P: 1.3 umol/L — ABNORMAL LOW (ref >=4.8)
Small LDL Particle Number: 1132 nmol/L — ABNORMAL HIGH (ref <=527)

## 2013-09-09 LAB — URINE CULTURE: Colony Count: NO GROWTH

## 2013-09-09 MED ORDER — DABIGATRAN ETEXILATE MESYLATE 150 MG PO CAPS: 150.0000 mg | ORAL_CAPSULE | Freq: Two times a day (BID) | ORAL | Status: DC

## 2013-09-09 NOTE — Telephone Encounter (Signed)
Done

## 2013-09-10 ENCOUNTER — Ambulatory Visit: Payer: Medicare Other

## 2013-09-10 DIAGNOSIS — R7309 Other abnormal glucose: Secondary | ICD-10-CM

## 2013-09-11 ENCOUNTER — Encounter: Payer: Self-pay | Admitting: General Practice

## 2013-09-14 ENCOUNTER — Encounter: Payer: Self-pay | Admitting: *Deleted

## 2013-09-14 NOTE — Progress Notes (Signed)
Letter mailed to patient.

## 2013-09-17 ENCOUNTER — Telehealth: Payer: Self-pay | Admitting: *Deleted

## 2013-09-17 ENCOUNTER — Telehealth: Payer: Self-pay | Admitting: Internal Medicine

## 2013-09-17 NOTE — Telephone Encounter (Addendum)
Patient called about her lab results. She had questions about her chl and triglycerides. thanks

## 2013-09-17 NOTE — Telephone Encounter (Signed)
Patient left message on triage line with only contact information but a reason for the call. Attempted to call pt back, left message on voice mail to return our call with more details so that we could better assist her.

## 2013-09-29 ENCOUNTER — Ambulatory Visit (INDEPENDENT_AMBULATORY_CARE_PROVIDER_SITE_OTHER): Payer: Medicare Other | Admitting: Internal Medicine

## 2013-09-29 ENCOUNTER — Encounter: Payer: Self-pay | Admitting: Internal Medicine

## 2013-09-29 ENCOUNTER — Encounter: Payer: Medicare Other | Admitting: Internal Medicine

## 2013-09-29 VITALS — BP 114/74 | HR 63 | Ht 66.0 in | Wt 184.4 lb

## 2013-09-29 DIAGNOSIS — H8110 Benign paroxysmal vertigo, unspecified ear: Secondary | ICD-10-CM

## 2013-09-29 DIAGNOSIS — Z950 Presence of cardiac pacemaker: Secondary | ICD-10-CM

## 2013-09-29 DIAGNOSIS — I4891 Unspecified atrial fibrillation: Secondary | ICD-10-CM

## 2013-09-29 NOTE — Assessment & Plan Note (Signed)
Her [employer]onic pacemaker is interrogated today and found to be working normally. We'll plan to recheck in several months.

## 2013-09-29 NOTE — Patient Instructions (Signed)
Your physician recommends that you continue on your current medications as directed. Please refer to the Current Medication list given to you today.  Your physician wants you to follow-up in: 1 with Dr. Ladona Ridgel. You will receive a reminder letter in the mail two months in advance. If you don't receive a letter, please call our office to schedule the follow-up appointment.  Remote monitoring is used to monitor your Pacemaker of ICD from home. This monitoring reduces the number of office visits required to check your device to one time per year. It allows [place] to keep an eye on the functioning of your device to ensure it is working properly. You are scheduled for a device check from home on 12/31/2013. You may send your transmission at any time that day. If you have a wireless device, the transmission will be sent automatically. After your physician reviews your transmission, you will receive a postcard with your next transmission date.

## 2013-09-29 NOTE — Assessment & Plan Note (Signed)
The patient is out of rhythm less than 1% of the time. She will continue her current medical therapy.

## 2013-09-29 NOTE — Progress Notes (Signed)
HPI [name] returns today for followup. She is a very pleasant 79 year old woman, with a history of symptomatic bradycardia,  hypertension, status post permanent pacemaker insertion. In the interim, the patient has been stable. She admits to occasional dietary indiscretion. She denies chest pain, shortness of breath, or peripheral edema. No syncope.  No palpitations.  Allergies  Allergen Reactions  . Cefpodoxime     Nocturia & disturbed sleep  . Codeine     nausea     Current Outpatient Prescriptions  Medication Sig Dispense Refill  . allopurinol (ZYLOPRIM) 100 MG tablet Take 1 tablet (100 mg total) by mouth daily.  90 tablet  0  . benazepril (LOTENSIN) 40 MG tablet Take 20 mg by mouth every morning.      . [facility] ACETAMINOPHEN PO Take 650 mg by mouth as needed.      . dabigatran (PRADAXA) 150 MG CAPS capsule Take 1 capsule (150 mg total) by mouth every 12 (twelve) hours.  60 capsule  6  . fish oil-omega-3 fatty acids 1000 MG capsule Take 1 g by mouth every morning.       . isosorbide mononitrate (IMDUR) 30 MG 24 hr tablet Take 1 tablet (30 mg total) by mouth every morning.  90 tablet  3  . metoprolol succinate (TOPROL-XL) 50 MG 24 hr tablet Take 1 tablet (50 mg total) by mouth every morning. Take with or immediately following a meal.  90 tablet  3  . nitroGLYCERIN (NITROSTAT) 0.4 MG SL tablet Place 0.4 mg under the tongue every 5 (five) minutes as needed for chest pain.       No current facility-administered medications for this visit.     Past Medical History  Diagnosis Date  . Hypertension   . Diverticulosis of colon   . Osteoarthritis   . Colonic polyp   . Sick sinus syndrome     with paroxysmal atrial fibrillation  . Hyperlipidemia   . Dyslipidemia   . Coronary artery disease     Moderate three vessel obstructive coronary disease  . Vertigo   . Chronic kidney disease 2011    creat 1.5   . E. coli UTI 02/2013    uniformly sensitive    ROS:   All systems reviewed and  negative except as noted in the HPI.   Past Surgical History  Procedure Laterality Date  . Cholecystectomy      age 76  . Abdominal hysterectomy    . Oophorectomy      bilat for fibroids @ age 49, anemia pre op due to dysfunctional menses yo  . Tonsillectomy    . Breast biopsy      x 1  . Appendectomy      done at [facility]  . Breast lumpectomy  2009    Dr Jamey Ripa  . Paf induced mi, s/p pacer for brady-tach syndrome  04/2010    Dr.jordan  . Other surgical history  hysterectomy  . Cholecystectomy    . Breast biopsy    . Pacemaker insertion  2011     Family History  Problem Relation Age of Onset  . Hyperlipidemia Father   . Stroke Father     TIA,CVA  . Heart failure Father   . Cancer Mother     ? renal primary  . Breast cancer Sister     11/08  . Breast cancer Maternal Aunt   . Colon cancer Maternal Aunt   . Breast cancer Daughter     lumpectomy 2007;mastectomy 2013  .  Heart disease Brother     pacer  . Diabetes Neg Hx      History   Social History  . Marital Status: Widowed    Spouse Name: N/A    Number of Children: N/A  . Years of Education: N/A   Occupational History  . Not on file.   Social History Main Topics  . Smoking status: Never Smoker   . Smokeless tobacco: Not on file  . Alcohol Use: No  . Drug Use: No  . Sexual Activity: Not Currently   Other Topics Concern  . Not on file   Social History Narrative   Regular exercise- no      BP 114/74  Pulse 63  Ht 5\' 6"  (1.676 m)  Wt 184 lb 6.4 oz (83.643 kg)  BMI 29.78 kg/m2  Physical Exam:  Well appearing elderly woman, NAD HEENT: Unremarkable Neck:  No JVD, no thyromegally Lungs:  Clear with no wheezes, rales, or rhonchi. HEART:  Regular rate rhythm, no murmurs, no rubs, no clicks Abd:  soft, positive bowel sounds, no organomegally, no rebound, no guarding Ext:  2 plus pulses, no edema, no cyanosis, no clubbing Skin:  No rashes no nodules Neuro:  CN II through XII intact, motor grossly  intact  DEVICE  Normal device function.  See PaceArt for details.   Assess/Plan:

## 2013-10-02 ENCOUNTER — Encounter: Payer: Self-pay | Admitting: Cardiology

## 2013-10-07 ENCOUNTER — Ambulatory Visit: Payer: Medicare Other | Admitting: Cardiology

## 2013-11-06 DIAGNOSIS — H7290 Unspecified perforation of tympanic membrane, unspecified ear: Secondary | ICD-10-CM | POA: Diagnosis not present

## 2013-11-06 DIAGNOSIS — H9209 Otalgia, unspecified ear: Secondary | ICD-10-CM | POA: Diagnosis not present

## 2013-11-06 DIAGNOSIS — H6690 Otitis media, unspecified, unspecified ear: Secondary | ICD-10-CM | POA: Diagnosis not present

## 2013-11-17 DIAGNOSIS — B3784 Candidal otitis externa: Secondary | ICD-10-CM | POA: Diagnosis not present

## 2013-12-01 ENCOUNTER — Ambulatory Visit (INDEPENDENT_AMBULATORY_CARE_PROVIDER_SITE_OTHER): Payer: Medicare Other | Admitting: Cardiology

## 2013-12-01 ENCOUNTER — Encounter: Payer: Self-pay | Admitting: Cardiology

## 2013-12-01 VITALS — BP 146/77 | HR 80 | Ht 66.0 in | Wt 185.2 lb

## 2013-12-01 DIAGNOSIS — E785 Hyperlipidemia, unspecified: Secondary | ICD-10-CM | POA: Diagnosis not present

## 2013-12-01 DIAGNOSIS — I2510 Atherosclerotic heart disease of native coronary artery without angina pectoris: Secondary | ICD-10-CM | POA: Diagnosis not present

## 2013-12-01 DIAGNOSIS — I495 Sick sinus syndrome: Secondary | ICD-10-CM

## 2013-12-01 DIAGNOSIS — I4891 Unspecified atrial fibrillation: Secondary | ICD-10-CM | POA: Diagnosis not present

## 2013-12-01 NOTE — Progress Notes (Signed)
Brooke Rios Date of Birth: [birth year] Medical Record #[id number]  History of Present Illness: Brooke Rios is seen today for a followup visit. She has SSS with pacemaker in place. Has had PAF, HTN, HLD and moderate 3 vessel CAD per cath back in 2011. She denies any complaints today. Denies chest pain or SOB. No palpitations. Admits poor dietary compliance and has gained 8 lbs. Has occ. Nosebleeds so will skip evening dose of Pradaxa. Last pacemaker check was satisfactory with periodic Afib.  Current Outpatient Prescriptions on File Prior to Visit  Medication Sig Dispense Refill  . allopurinol (ZYLOPRIM) 100 MG tablet Take 1 tablet (100 mg total) by mouth daily.  90 tablet  0  . benazepril (LOTENSIN) 40 MG tablet Take 20 mg by mouth every morning.      . [facility] ACETAMINOPHEN PO Take 650 mg by mouth as needed.      . dabigatran (PRADAXA) 150 MG CAPS capsule Take 1 capsule (150 mg total) by mouth every 12 (twelve) hours.  60 capsule  6  . fish oil-omega-3 fatty acids 1000 MG capsule Take 1 g by mouth every morning.       . isosorbide mononitrate (IMDUR) 30 MG 24 hr tablet Take 1 tablet (30 mg total) by mouth every morning.  90 tablet  3  . metoprolol succinate (TOPROL-XL) 50 MG 24 hr tablet Take 1 tablet (50 mg total) by mouth every morning. Take with or immediately following a meal.  90 tablet  3  . nitroGLYCERIN (NITROSTAT) 0.4 MG SL tablet Place 0.4 mg under the tongue every 5 (five) minutes as needed for chest pain.       No current facility-administered medications on file prior to visit.    Allergies  Allergen Reactions  . Cefpodoxime     Nocturia & disturbed sleep  . Codeine     nausea    Past Medical History  Diagnosis Date  . Hypertension   . Diverticulosis of colon   . Osteoarthritis   . Colonic polyp   . Sick sinus syndrome     with paroxysmal atrial fibrillation  . Hyperlipidemia   . Dyslipidemia   . Coronary artery disease     Moderate three vessel obstructive coronary  disease  . Vertigo   . Chronic kidney disease 2011    creat 1.5   . E. coli UTI 02/2013    uniformly sensitive    Past Surgical History  Procedure Laterality Date  . Cholecystectomy      age 56  . Abdominal hysterectomy    . Oophorectomy      bilat for fibroids @ age 59, anemia pre op due to dysfunctional menses yo  . Tonsillectomy    . Breast biopsy      x 1  . Appendectomy      done at [facility]  . Breast lumpectomy  2009    Brooke Rios  . Paf induced mi, s/p pacer for brady-tach syndrome  04/2010    Brooke Rios  . Other surgical history  hysterectomy  . Cholecystectomy    . Breast biopsy    . Pacemaker insertion  2011    History  Smoking status  . Never Smoker   Smokeless tobacco  . Not on file    History  Alcohol Use No    Family History  Problem Relation Age of Onset  . Hyperlipidemia Father   . Stroke Father     TIA,CVA  . Heart failure Father   .  Cancer Mother     ? renal primary  . Breast cancer Sister     11/08  . Breast cancer Maternal Aunt   . Colon cancer Maternal Aunt   . Breast cancer Daughter     lumpectomy 2007;mastectomy 2013  . Heart disease Brother     pacer  . Diabetes Neg Hx     Review of Systems: The review of systems is per the HPI.  All other systems were reviewed and are negative.  Physical Exam: BP 146/77  Pulse 80  Ht 5\' 6"  (1.676 m)  Wt 185 lb 3.2 oz (84.006 kg)  BMI 29.91 kg/m2 Patient is very pleasant and in no acute distress. Skin is warm and dry. Color is normal.  HEENT is unremarkable. Normocephalic/atraumatic. PERRL. Sclera are nonicteric. Neck is supple. No masses. No JVD. Lungs are clear. Cardiac exam shows a regular rate and rhythm. Abdomen is soft. Extremities are without edema. Gait and ROM are intact. No gross neurologic deficits noted.  LABORATORY DATA:  Lab Results  Component Value Date   WBC 5.0 09/08/2013   HGB 13.1 09/08/2013   HCT 38.2 09/08/2013   PLT 218.0 09/08/2013   GLUCOSE 112* 09/08/2013   CHOL  209* 06/12/2012   TRIG 302* 09/08/2013   HDL 36.60* 06/12/2012   LDLDIRECT 114.0 06/12/2012   LDLCALC 122* 09/08/2013   ALT 23 09/08/2013   AST 23 09/08/2013   NA 139 09/08/2013   K 4.5 09/08/2013   CL 103 09/08/2013   CREATININE 1.4* 09/08/2013   BUN 22 09/08/2013   CO2 26 09/08/2013   TSH 2.92 09/08/2013   INR 0.97 04/23/2010   HGBA1C 6.3 09/10/2013   MICROALBUR 0.2 07/01/2007    Assessment / Plan:  1. HTN - fair control. Continue current therapy.   2. CAD - moderate 3VD per cath back in 2011 - no chest pain. Follow up in 6 months.  3. HLD - on fish oil only. Reports intolerance to statins- just made her feel bad. Stressed the importance of dietary compliance and regular aerobic exrercise.  4. SSS/PAF - with pacemaker in place - followed by Brooke. Lovena Rios - managed with rate control and on chronic Pradaxa. Stressed importance of compliance.

## 2013-12-01 NOTE — Patient Instructions (Signed)
You really need to watch your diet better as outlined by Dr. Linna Darner.  You need to get daily exercise and lose weight.  I will see you in 6 months.

## 2013-12-07 ENCOUNTER — Other Ambulatory Visit: Payer: Self-pay

## 2013-12-07 MED ORDER — BENAZEPRIL HCL 40 MG PO TABS: 20.0000 mg | ORAL_TABLET | Freq: Every morning | ORAL | Status: DC

## 2014-01-06 ENCOUNTER — Other Ambulatory Visit: Payer: Self-pay

## 2014-01-06 ENCOUNTER — Emergency Department (HOSPITAL_COMMUNITY)
Admission: EM | Admit: 2014-01-06 | Discharge: 2014-01-07 | Disposition: A | Discharge status: Home / Self Care | Payer: Medicare Other | Attending: Emergency Medicine | Admitting: Emergency Medicine

## 2014-01-06 ENCOUNTER — Encounter (HOSPITAL_COMMUNITY): Payer: Self-pay | Admitting: Emergency Medicine

## 2014-01-06 DIAGNOSIS — Z8719 Personal history of other diseases of the digestive system: Secondary | ICD-10-CM | POA: Diagnosis not present

## 2014-01-06 DIAGNOSIS — Z79899 Other long term (current) drug therapy: Secondary | ICD-10-CM | POA: Diagnosis not present

## 2014-01-06 DIAGNOSIS — Z87440 Personal history of urinary (tract) infections: Secondary | ICD-10-CM | POA: Diagnosis not present

## 2014-01-06 DIAGNOSIS — Z860100 Personal history of colon polyps, unspecified: Secondary | ICD-10-CM | POA: Insufficient documentation

## 2014-01-06 DIAGNOSIS — Z8619 Personal history of other infectious and parasitic diseases: Secondary | ICD-10-CM | POA: Diagnosis not present

## 2014-01-06 DIAGNOSIS — M25519 Pain in unspecified shoulder: Secondary | ICD-10-CM | POA: Diagnosis not present

## 2014-01-06 DIAGNOSIS — Z862 Personal history of diseases of the blood and blood-forming organs and certain disorders involving the immune mechanism: Secondary | ICD-10-CM | POA: Insufficient documentation

## 2014-01-06 DIAGNOSIS — Z8739 Personal history of other diseases of the musculoskeletal system and connective tissue: Secondary | ICD-10-CM | POA: Diagnosis not present

## 2014-01-06 DIAGNOSIS — I2510 Atherosclerotic heart disease of native coronary artery without angina pectoris: Secondary | ICD-10-CM | POA: Insufficient documentation

## 2014-01-06 DIAGNOSIS — Z8639 Personal history of other endocrine, nutritional and metabolic disease: Secondary | ICD-10-CM | POA: Insufficient documentation

## 2014-01-06 DIAGNOSIS — I129 Hypertensive chronic kidney disease with stage 1 through stage 4 chronic kidney disease, or unspecified chronic kidney disease: Secondary | ICD-10-CM | POA: Diagnosis not present

## 2014-01-06 DIAGNOSIS — M898X1 Other specified disorders of bone, shoulder: Secondary | ICD-10-CM

## 2014-01-06 DIAGNOSIS — N189 Chronic kidney disease, unspecified: Secondary | ICD-10-CM | POA: Diagnosis not present

## 2014-01-06 DIAGNOSIS — Z7902 Long term (current) use of antithrombotics/antiplatelets: Secondary | ICD-10-CM | POA: Insufficient documentation

## 2014-01-06 DIAGNOSIS — M25512 Pain in left shoulder: Secondary | ICD-10-CM

## 2014-01-06 NOTE — ED Notes (Addendum)
Pt reports left sided shoulder blade pain that radiates down the left pain that began two days ago. Pt reports calling her PCP who recommended that the patient present to the ED due to the possibility of MI. Pt reports a positive history for MI and has a defibrillator. Pt denies shortness of breath, nausea, emesis, lightheadedness, or dizziness. Pt denies injury or trauma. Pt is A/O x4.

## 2014-01-07 ENCOUNTER — Emergency Department (HOSPITAL_COMMUNITY): Payer: Medicare Other

## 2014-01-07 DIAGNOSIS — R918 Other nonspecific abnormal finding of lung field: Secondary | ICD-10-CM | POA: Diagnosis not present

## 2014-01-07 LAB — CBC WITH DIFFERENTIAL/PLATELET
BASOS ABS: 0 10*3/uL (ref 0.0–0.1)
BASOS PCT: 0 % (ref 0–1)
EOS PCT: 2 % (ref 0–5)
Eosinophils Absolute: 0.1 10*3/uL (ref 0.0–0.7)
HEMATOCRIT: 37.7 % (ref 36.0–46.0)
HEMOGLOBIN: 12.2 g/dL (ref 12.0–15.0)
LYMPHS PCT: 20 % (ref 12–46)
Lymphs Abs: 1.1 10*3/uL (ref 0.7–4.0)
MCH: 27.7 pg (ref 26.0–34.0)
MCHC: 32.4 g/dL (ref 30.0–36.0)
MCV: 85.5 fL (ref 78.0–100.0)
MONOS PCT: 9 % (ref 3–12)
Monocytes Absolute: 0.5 10*3/uL (ref 0.1–1.0)
NEUTROS ABS: 3.7 10*3/uL (ref 1.7–7.7)
Neutrophils Relative %: 69 % (ref 43–77)
Platelets: 148 10*3/uL — ABNORMAL LOW (ref 150–400)
RBC: 4.41 MIL/uL (ref 3.87–5.11)
RDW: 13.8 % (ref 11.5–15.5)
WBC: 5.3 10*3/uL (ref 4.0–10.5)

## 2014-01-07 LAB — BASIC METABOLIC PANEL
BUN: 25 mg/dL — AB (ref 6–23)
CALCIUM: 9.9 mg/dL (ref 8.4–10.5)
CO2: 24 meq/L (ref 19–32)
CREATININE: 1.62 mg/dL — AB (ref 0.50–1.10)
Chloride: 101 mEq/L (ref 96–112)
GFR calc Af Amer: 32 mL/min — ABNORMAL LOW (ref >90)
GFR calc non Af Amer: 28 mL/min — ABNORMAL LOW (ref >90)
Glucose, Bld: 119 mg/dL — ABNORMAL HIGH (ref 70–99)
Potassium: 4.6 mEq/L (ref 3.7–5.3)
Sodium: 141 mEq/L (ref 137–147)

## 2014-01-07 LAB — TROPONIN I: Troponin I: <0.3 ng/mL (ref <0.30)

## 2014-01-07 MED ORDER — TRAMADOL HCL 50 MG PO TABS
25.0000 mg | ORAL_TABLET | Freq: Once | ORAL | Status: DC
  Filled 2014-01-07: qty 1

## 2014-01-07 NOTE — ED Provider Notes (Signed)
CSN: [id number]     Arrival date & time 01/06/14  2232 History   First MD Initiated Contact with Patient 01/06/14 2351     Chief Complaint  Patient presents with  . Shoulder Pain     (Consider location/radiation/quality/duration/timing/severity/associated sxs/prior Treatment) HPI 80 year old female presents to emergency department from home with complaint of 2 days of left shoulder pain.  Pain starts at her left shoulder blade and radiates down her arm.  Pain is described as dull.  She denies any nausea, shortness of breath, anterior chest pain, dizziness.  Patient has history of sick sinus syndrome with paroxysmal A. fib, status post pacemaker.  She has history of moderate 3 vessel coronary disease, seen on cath in 2011.  Patient was instructed to come to the ER for further workup.  Due to possibility of MI with her left shoulder pain.  Patient reports pain is mainly during the day and with certain positions.  She denies any trauma or new activities that may prompted the pain in her shoulder blade. Past Medical History  Diagnosis Date  . Hypertension   . Diverticulosis of colon   . Osteoarthritis   . Colonic polyp   . Sick sinus syndrome     with paroxysmal atrial fibrillation  . Hyperlipidemia   . Dyslipidemia   . Coronary artery disease     Moderate three vessel obstructive coronary disease  . Vertigo   . Chronic kidney disease 2011    creat 1.5   . E. coli UTI 02/2013    uniformly sensitive   Past Surgical History  Procedure Laterality Date  . Cholecystectomy      age 65  . Abdominal hysterectomy    . Oophorectomy      bilat for fibroids @ age 21, anemia pre op due to dysfunctional menses yo  . Tonsillectomy    . Breast biopsy      x 1  . Appendectomy      done at [facility]  . Breast lumpectomy  2009    Dr Margot Chimes  . Paf induced mi, s/p pacer for brady-tach syndrome  04/2010    Dr.jordan  . Other surgical history  hysterectomy  . Cholecystectomy    . Breast biopsy    .  Pacemaker insertion  2011   Family History  Problem Relation Age of Onset  . Hyperlipidemia Father   . Stroke Father     TIA,CVA  . Heart failure Father   . Cancer Mother     ? renal primary  . Breast cancer Sister     11/08  . Breast cancer Maternal Aunt   . Colon cancer Maternal Aunt   . Breast cancer Daughter     lumpectomy 2007;mastectomy 2013  . Heart disease Brother     pacer  . Diabetes Neg Hx    History  Substance Use Topics  . Smoking status: Never Smoker   . Smokeless tobacco: Not on file  . Alcohol Use: No   OB History   Grav Para Term Preterm Abortions TAB SAB Ect Mult Living                 Review of Systems  See History of Present Illness; otherwise all other systems are reviewed and negative   Allergies  Cefpodoxime and Codeine  Home Medications   Current Outpatient Rx  Name  Route  Sig  Dispense  Refill  . allopurinol (ZYLOPRIM) 100 MG tablet   Oral   Take 1 tablet (  100 mg total) by mouth daily.   90 tablet   0   . benazepril (LOTENSIN) 40 MG tablet   Oral   Take 0.5 tablets (20 mg total) by mouth every morning.   30 tablet   6   . [facility] ACETAMINOPHEN PO   Oral   Take 650 mg by mouth as needed.         . dabigatran (PRADAXA) 150 MG CAPS capsule   Oral   Take 1 capsule (150 mg total) by mouth every 12 (twelve) hours.   60 capsule   6   . fish oil-omega-3 fatty acids 1000 MG capsule   Oral   Take 1 g by mouth every morning.          . isosorbide mononitrate (IMDUR) 30 MG 24 hr tablet   Oral   Take 1 tablet (30 mg total) by mouth every morning.   90 tablet   3   . metoprolol succinate (TOPROL-XL) 50 MG 24 hr tablet   Oral   Take 1 tablet (50 mg total) by mouth every morning. Take with or immediately following a meal.   90 tablet   3   . nitroGLYCERIN (NITROSTAT) 0.4 MG SL tablet   Sublingual   Place 0.4 mg under the tongue every 5 (five) minutes as needed for chest pain.          BP 166/78  Pulse 61  Temp(Src)  98.1 F (36.7 C) (Oral)  Resp 16  SpO2 97% Physical Exam  Nursing note and vitals reviewed. Constitutional: She is oriented to person, place, and time. She appears well-developed and well-nourished.  HENT:  Head: Normocephalic and atraumatic.  Right Ear: External ear normal.  Left Ear: External ear normal.  Nose: Nose normal.  Mouth/Throat: Oropharynx is clear and moist.  Eyes: Conjunctivae and EOM are normal. Pupils are equal, round, and reactive to light.  Neck: Normal range of motion. Neck supple. No JVD present. No tracheal deviation present. No thyromegaly present.  Cardiovascular: Normal rate, regular rhythm, normal heart sounds and intact distal pulses.  Exam reveals no gallop and no friction rub.   No murmur heard. Pulmonary/Chest: Effort normal and breath sounds normal. No stridor. No respiratory distress. She has no wheezes. She has no rales. She exhibits no tenderness.  Abdominal: Soft. Bowel sounds are normal. She exhibits no distension and no mass. There is no tenderness. There is no rebound and no guarding.  Musculoskeletal: Normal range of motion. She exhibits tenderness (Patient has point tenderness along the medial aspect of her left shoulder blade.  Palpation of this area reproduces the pain.). She exhibits no edema.  Lymphadenopathy:    She has no cervical adenopathy.  Neurological: She is alert and oriented to person, place, and time. She exhibits normal muscle tone. Coordination normal.  Skin: Skin is warm and dry. No rash noted. No erythema. No pallor.  Psychiatric: She has a normal mood and affect. Her behavior is normal. Judgment and thought content normal.    ED Course  Procedures (including critical care time) Labs Review Labs Reviewed  CBC WITH DIFFERENTIAL - Abnormal; Notable for the following:    Platelets 148 (*)    All other components within normal limits  BASIC METABOLIC PANEL - Abnormal; Notable for the following:    Glucose, Bld 119 (*)    BUN 25  (*)    Creatinine, Ser 1.62 (*)    GFR calc non Af Amer 28 (*)    GFR  calc Af Amer 32 (*)    All other components within normal limits  TROPONIN I   Imaging Review Dg Chest 2 View  01/07/2014   CLINICAL DATA:  .  And left arms is serious  EXAM: CHEST  2 VIEW  COMPARISON:  DG CHEST 2 VIEW dated 04/26/2010  FINDINGS: Cardiac silhouette remains mildly enlarged, mediastinal silhouette is nonsuspicious. Tortuous mildly calcified aorta suggests no pleural effusions or focal consolidations. No pneumothorax.  Dual lead left cardiac pacemaker in situ. Calcifications about the left shoulder could reflect a loose body. Surgical clips in the right abdomen may reflect cholecystectomy.  IMPRESSION: Mild cardiomegaly without acute pulmonary process.   Electronically Signed   By: Elon Alas   On: 01/07/2014 01:36   EKG Interpretation   None       Date: 01/07/2014  Rate:60   Rhythm: atrial pacing with prolonged AV conduction  QRS Axis: left  Intervals: PR prolonged  ST/T Wave abnormalities: normal  Conduction Disutrbances:none  Narrative Interpretation:   Old EKG Reviewed: unchanged    MDM   Final diagnoses:  Pain of left scapula  Left shoulder pain    80 year old female with history of coronary disease with left shoulder pain for 2 days.  Pain does appear to be musculoskeletal in origin.  EKG without ischemic changes.  Given her past history, we'll check labs.  Pain has been constant for over 24 hours per her report, and I feel that one troponin.  Should be adequate in ruling out cardiac ischemia.    Kalman Drape, MD 01/07/14 [phone]

## 2014-01-07 NOTE — Discharge Instructions (Signed)
Your workup today has not shown a specific cause for your symptoms.  It does not appear to be related to your heart.  Your pain appears to be musculoskeletal in origin.  You can use a heat pack on the area or take Tylenol.   Arthralgia Your caregiver has diagnosed you as suffering from an arthralgia. Arthralgia means there is pain in a joint. This can come from many reasons including:  Bruising the joint which causes soreness (inflammation) in the joint.  Wear and tear on the joints which occur as we grow older (osteoarthritis).  Overusing the joint.  Various forms of arthritis.  Infections of the joint. Regardless of the cause of pain in your joint, most of these different pains respond to anti-inflammatory drugs and rest. The exception to this is when a joint is infected, and these cases are treated with antibiotics, if it is a bacterial infection. HOME CARE INSTRUCTIONS   Rest the injured area for as long as directed by your caregiver. Then slowly start using the joint as directed by your caregiver and as the pain allows. Crutches as directed may be useful if the ankles, knees or hips are involved. If the knee was splinted or casted, continue use and care as directed. If an stretchy or elastic wrapping bandage has been applied today, it should be removed and re-applied every 3 to 4 hours. It should not be applied tightly, but firmly enough to keep swelling down. Watch toes and feet for swelling, bluish discoloration, coldness, numbness or excessive pain. If any of these problems (symptoms) occur, remove the ace bandage and re-apply more loosely. If these symptoms persist, contact your caregiver or return to this location.  For the first 24 hours, keep the injured extremity elevated on pillows while lying down.  Apply ice for 15-20 minutes to the sore joint every couple hours while awake for the first half day. Then 03-04 times per day for the first 48 hours. Put the ice in a plastic bag and  place a towel between the bag of ice and your skin.  Wear any splinting, casting, elastic bandage applications, or slings as instructed.  Only take over-the-counter or prescription medicines for pain, discomfort, or fever as directed by your caregiver. Do not use aspirin immediately after the injury unless instructed by your physician. Aspirin can cause increased bleeding and bruising of the tissues.  If you were given crutches, continue to use them as instructed and do not resume weight bearing on the sore joint until instructed. Persistent pain and inability to use the sore joint as directed for more than 2 to 3 days are warning signs indicating that you should see a caregiver for a follow-up visit as soon as possible. Initially, a hairline fracture (break in bone) may not be evident on X-rays. Persistent pain and swelling indicate that further evaluation, non-weight bearing or use of the joint (use of crutches or slings as instructed), or further X-rays are indicated. X-rays may sometimes not show a small fracture until a week or 10 days later. Make a follow-up appointment with your own caregiver or one to whom we have referred you. A radiologist (specialist in reading X-rays) may read your X-rays. Make sure you know how you are to obtain your X-ray results. Do not assume everything is normal if you do not hear from [place]. SEEK MEDICAL CARE IF: Bruising, swelling, or pain increases. SEEK IMMEDIATE MEDICAL CARE IF:   Your fingers or toes are numb or blue.  The  pain is not responding to medications and continues to stay the same or get worse.  The pain in your joint becomes severe.  You develop a fever over 102 F (38.9 C).  It becomes impossible to move or use the joint. MAKE SURE YOU:   Understand these instructions.  Will watch your condition.  Will get help right away if you are not doing well or get worse. Document Released: 11/05/2005 Document Revised: 01/28/2012 Document Reviewed:  06/23/2008 [facility] Patient Information 2014 [place].  Shoulder Pain The shoulder is the joint that connects your arms to your body. The bones that form the shoulder joint include the upper arm bone (humerus), the shoulder blade (scapula), and the collarbone (clavicle). The top of the humerus is shaped like a ball and fits into a rather flat socket on the scapula (glenoid cavity). A combination of muscles and strong, fibrous tissues that connect muscles to bones (tendons) support your shoulder joint and hold the ball in the socket. Small, fluid-filled sacs (bursae) are located in different areas of the joint. They act as cushions between the bones and the overlying soft tissues and help reduce friction between the gliding tendons and the bone as you move your arm. Your shoulder joint allows a wide range of motion in your arm. This range of motion allows you to do things like scratch your back or throw a ball. However, this range of motion also makes your shoulder more prone to pain from overuse and injury. Causes of shoulder pain can originate from both injury and overuse and usually can be grouped in the following four categories:  Redness, swelling, and pain (inflammation) of the tendon (tendinitis) or the bursae (bursitis).  Instability, such as a dislocation of the joint.  Inflammation of the joint (arthritis).  Broken bone (fracture). HOME CARE INSTRUCTIONS   Apply ice to the sore area.  Put ice in a plastic bag.  Place a towel between your skin and the bag.  Leave the ice on for 15-20 minutes, 03-04 times per day for the first 2 days.  Stop using cold packs if they do not help with the pain.  If you have a shoulder sling or immobilizer, wear it as long as your caregiver instructs. Only remove it to shower or bathe. Move your arm as little as possible, but keep your hand moving to prevent swelling.  Squeeze a soft ball or foam pad as much as possible to help prevent  swelling.  Only take over-the-counter or prescription medicines for pain, discomfort, or fever as directed by your caregiver. SEEK MEDICAL CARE IF:   Your shoulder pain increases, or new pain develops in your arm, hand, or fingers.  Your hand or fingers become cold and numb.  Your pain is not relieved with medicines. SEEK IMMEDIATE MEDICAL CARE IF:   Your arm, hand, or fingers are numb or tingling.  Your arm, hand, or fingers are significantly swollen or turn white or blue. MAKE SURE YOU:   Understand these instructions.  Will watch your condition.  Will get help right away if you are not doing well or get worse. Document Released: 08/15/2005 Document Revised: 07/30/2012 Document Reviewed: 10/20/2011 [facility] Patient Information 2014 [place].  Pain of Unknown Etiology (Pain Without a Known Cause) You have come to your caregiver because of pain. Pain can occur in any part of the body. Often there is not a definite cause. If your laboratory (blood or urine) work was normal and X-rays or other studies  were normal, your caregiver may treat you without knowing the cause of the pain. An example of this is the headache. Most headaches are diagnosed by taking a history. This means your caregiver asks you questions about your headaches. Your caregiver determines a treatment based on your answers. Usually testing done for headaches is normal. Often testing is not done unless there is no response to medications. Regardless of where your pain is located today, you can be given medications to make you comfortable. If no physical cause of pain can be found, most cases of pain will gradually leave as suddenly as they came.  If you have a painful condition and no reason can be found for the pain, it is important that you follow up with your caregiver. If the pain becomes worse or does not go away, it may be necessary to repeat tests and look further for a possible cause.  Only take  over-the-counter or prescription medicines for pain, discomfort, or fever as directed by your caregiver.  For the protection of your privacy, test results cannot be given over the phone. Make sure you receive the results of your test. Ask how these results are to be obtained if you have not been informed. It is your responsibility to obtain your test results.  You may continue all activities unless the activities cause more pain. When the pain lessens, it is important to gradually resume normal activities. Resume activities by beginning slowly and gradually increasing the intensity and duration of the activities or exercise. During periods of severe pain, bed rest may be helpful. Lie or sit in any position that is comfortable.  Ice used for acute (sudden) conditions may be effective. Use a large plastic bag filled with ice and wrapped in a towel. This may provide pain relief.  See your caregiver for continued problems. Your caregiver can help or refer you for exercises or physical therapy if necessary. If you were given medications for your condition, do not drive, operate machinery or power tools, or sign legal documents for 24 hours. Do not drink alcohol, take sleeping pills, or take other medications that may interfere with treatment. See your caregiver immediately if you have pain that is becoming worse and not relieved by medications. Document Released: 07/31/2001 Document Revised: 08/26/2013 Document Reviewed: 11/05/2005 [facility] Patient Information 2014 [place].

## 2014-02-03 ENCOUNTER — Other Ambulatory Visit: Payer: Self-pay

## 2014-02-03 DIAGNOSIS — Z1231 Encounter for screening mammogram for malignant neoplasm of breast: Secondary | ICD-10-CM

## 2014-03-02 ENCOUNTER — Ambulatory Visit
Admission: RE | Admit: 2014-03-02 | Discharge: 2014-03-02 | Disposition: A | Discharge status: Home / Self Care | Payer: Medicare Other | Source: Ambulatory Visit

## 2014-03-02 DIAGNOSIS — Z1231 Encounter for screening mammogram for malignant neoplasm of breast: Secondary | ICD-10-CM | POA: Diagnosis not present

## 2014-03-02 DIAGNOSIS — Z853 Personal history of malignant neoplasm of breast: Secondary | ICD-10-CM | POA: Diagnosis not present

## 2014-03-17 ENCOUNTER — Telehealth: Payer: Self-pay | Admitting: Internal Medicine

## 2014-03-17 NOTE — Telephone Encounter (Signed)
LMOM to let know to send transmission anytime.

## 2014-03-17 NOTE — Telephone Encounter (Signed)
New problem   Pt need to know when does she need to do a remote pacer check from home. Please call pt

## 2014-03-18 ENCOUNTER — Encounter: Payer: Self-pay | Admitting: Internal Medicine

## 2014-03-18 ENCOUNTER — Ambulatory Visit (INDEPENDENT_AMBULATORY_CARE_PROVIDER_SITE_OTHER): Payer: Medicare Other | Admitting: *Deleted

## 2014-03-18 DIAGNOSIS — I498 Other specified cardiac arrhythmias: Secondary | ICD-10-CM | POA: Diagnosis not present

## 2014-03-30 LAB — MDC_IDC_ENUM_SESS_TYPE_REMOTE
Battery Impedance: 202 Ohm
Brady Statistic AP VP Percent: 5 %
Brady Statistic AP VS Percent: 92 %
Brady Statistic AS VP Percent: 0 %
Brady Statistic AS VS Percent: 2 %
Date Time Interrogation Session: 20150430164022
Lead Channel Impedance Value: 392 Ohm
Lead Channel Impedance Value: 464 Ohm
Lead Channel Pacing Threshold Amplitude: 0.5 V
Lead Channel Pacing Threshold Pulse Width: 0.4 ms
Lead Channel Sensing Intrinsic Amplitude: 22.4 mV
Lead Channel Setting Pacing Amplitude: 2.5 V
Lead Channel Setting Sensing Sensitivity: 2 mV
MDC IDC MSMT BATTERY REMAINING LONGEVITY: 115 mo
MDC IDC MSMT BATTERY VOLTAGE: 2.79 V
MDC IDC MSMT LEADCHNL RV PACING THRESHOLD AMPLITUDE: 0.625 V
MDC IDC MSMT LEADCHNL RV PACING THRESHOLD PULSEWIDTH: 0.4 ms
MDC IDC SET LEADCHNL RA PACING AMPLITUDE: 2 V
MDC IDC SET LEADCHNL RV PACING PULSEWIDTH: 0.4 ms

## 2014-04-01 ENCOUNTER — Encounter: Payer: Self-pay | Admitting: Cardiology

## 2014-04-02 NOTE — Progress Notes (Signed)
Remote pacemaker transmission.   

## 2014-04-12 ENCOUNTER — Ambulatory Visit (INDEPENDENT_AMBULATORY_CARE_PROVIDER_SITE_OTHER): Payer: Medicare Other | Admitting: Internal Medicine

## 2014-04-12 ENCOUNTER — Ambulatory Visit: Payer: Medicare Other

## 2014-04-12 VITALS — BP 122/70 | HR 70 | Temp 98.1°F | Resp 18 | Ht 67.0 in | Wt 184.0 lb

## 2014-04-12 DIAGNOSIS — M109 Gout, unspecified: Secondary | ICD-10-CM

## 2014-04-12 DIAGNOSIS — M79609 Pain in unspecified limb: Secondary | ICD-10-CM | POA: Diagnosis not present

## 2014-04-12 DIAGNOSIS — M7989 Other specified soft tissue disorders: Secondary | ICD-10-CM

## 2014-04-12 DIAGNOSIS — M79673 Pain in unspecified foot: Secondary | ICD-10-CM

## 2014-04-12 LAB — POCT CBC
Granulocyte percent: 63.8 %G (ref 37–80)
HCT, POC: 35.5 % — AB (ref 37.7–47.9)
Hemoglobin: 10.9 g/dL — AB (ref 12.2–16.2)
Lymph, poc: 1.5 (ref 0.6–3.4)
MCH: 27 pg (ref 27–31.2)
MCHC: 30.7 g/dL — AB (ref 31.8–35.4)
MCV: 87.8 fL (ref 80–97)
MID (CBC): 0.3 (ref 0–0.9)
MPV: 7.1 fL (ref 0–99.8)
PLATELET COUNT, POC: 200 10*3/uL (ref 142–424)
POC Granulocyte: 3.2 (ref 2–6.9)
POC LYMPH %: 29.6 % (ref 10–50)
POC MID %: 6.6 % (ref 0–12)
RBC: 4.04 M/uL (ref 4.04–5.48)
RDW, POC: 15.1 %
WBC: 5 10*3/uL (ref 4.6–10.2)

## 2014-04-12 LAB — POCT SEDIMENTATION RATE: POCT SED RATE: 30 mm/hr — AB (ref 0–22)

## 2014-04-12 LAB — URIC ACID: Uric Acid, Serum: 8.4 mg/dL — ABNORMAL HIGH (ref 2.4–7.0)

## 2014-04-12 MED ORDER — PREDNISONE 20 MG PO TABS: ORAL_TABLET | ORAL | Status: DC

## 2014-04-12 NOTE — Progress Notes (Addendum)
Subjective:  This chart was scribed for Dr. Tami Lin, MD by Ludger Nutting, ED Scribe. This patient was seen in room 14 and the patient's care was started 11:23 AM.    Patient ID: Brooke Rios, female    DOB: 1925/11/10, 80 y.o.   MRN: [id number]  HPI HPI Comments: Brooke Rios is a 80 y.o. female who presents to [facility] complaining of 4 days of gradual onset, gradually worsening, constant left foot pain and left 3rd toe pain. She reports associated swelling and redness that developed gradually after initial onset of pain. She denies any known injuries or trauma. She states the pain is worse with walking. She denies fever, chills, night sweats.  She denies a past history of gout although this is on her problem list. Patient Active Problem List   Diagnosis Date Noted  . History of anemia 09/08/2013  . Gout 05/22/2013  . Pacemaker-[employer] 09/10/2012  . Unspecified adverse effect of unspecified drug, medicinal and biological substance 06/12/2012  . Other abnormal glucose 06/12/2012  . Tachy-brady syndrome 02/05/2012  . Atrial fibrillation 09/11/2011  . Coronary artery disease   . HIP PAIN 10/17/2010  . DIZZINESS 11/22/2009  . RENAL DISEASE, CHRONIC, MILD 06/08/2009  . BENIGN PAROXYSMAL POSITIONAL VERTIGO 12/30/2008  . BRADYCARDIA, CHRONIC 12/30/2008  . DIVERTICULOSIS, COLON 12/30/2008  . OSTEOARTHRITIS 12/30/2008  . COLONIC POLYPS, HX OF 12/30/2008  . HYPERLIPIDEMIA 12/24/2007  . 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 HYPERTENSION 12/24/2007  . FIBROCYSTIC BREAST DISEASE 12/24/2007   Prior to Admission medications   Medication Sig Start Date End Date Taking? Authorizing Provider  benazepril (LOTENSIN) 40 MG tablet Take 0.5 tablets (20 mg total) by mouth every morning. 12/07/13  Yes Peter M Martinique, MD  dabigatran (PRADAXA) 150 MG CAPS capsule Take 1 capsule (150 mg total) by mouth every 12 (twelve) hours. 09/09/13  Yes Peter M Martinique, MD  isosorbide mononitrate  (IMDUR) 30 MG 24 hr tablet Take 1 tablet (30 mg total) by mouth every morning. 04/09/13  Yes Peter M Martinique, MD  metoprolol succinate (TOPROL-XL) 50 MG 24 hr tablet Take 1 tablet (50 mg total) by mouth every morning. Take with or immediately following a meal. 04/09/13  Yes Peter M Martinique, MD  nitroGLYCERIN (NITROSTAT) 0.4 MG SL tablet Place 0.4 mg under the tongue every 5 (five) minutes as needed for chest pain. 08/26/12  Yes Peter M Martinique, MD     Review of Systems  Constitutional: Negative for fever, chills and diaphoresis.  Musculoskeletal: Positive for arthralgias and joint swelling.  Skin: Positive for color change.   [facility] reading (PRIMARY) by  Dr. Laney Pastor there are marked degenerative changes of the third toe consistent with gout      Objective:   Physical Exam  Nursing note and vitals reviewed. Constitutional: She is oriented to person, place, and time. She appears well-developed and well-nourished.  HENT:  Head: Normocephalic and atraumatic.  Cardiovascular: Normal rate.   Pulmonary/Chest: Effort normal.  Abdominal: She exhibits no distension.  Musculoskeletal: She exhibits tenderness.  Left foot more swollen than right foot. Left foot with swelling around lateral malleolus and to dorsum of the foot. Tenderness to palpation over dorsum of left foot and left 3rd toe specifically. Left 3rd toe is swollen and red with pain on ROM. Sensation is intact. No cyanosis.   Neurological: She is alert and oriented to person, place, and time. No sensory deficit.  Skin: Skin is warm and dry. No cyanosis.  Psychiatric: She has a  normal mood and affect.          Assessment & Plan:   I have completed the patient encounter in its entirety as documented by the scribe, with editing by me where necessary. Padraig Nhan P. Laney Pastor, M.D.  Pain and edema foot secondary to gout Meds ordered this encounter  Medications  . predniSONE (DELTASONE) 20 MG tablet    Sig: 3/3/2/2/1/1 single daily dose  for 6 days    Dispense:  12 tablet    Refill:  0

## 2014-04-15 ENCOUNTER — Other Ambulatory Visit: Payer: Self-pay | Admitting: *Deleted

## 2014-04-15 ENCOUNTER — Encounter: Payer: Self-pay | Admitting: Internal Medicine

## 2014-04-15 MED ORDER — ISOSORBIDE MONONITRATE ER 30 MG PO TB24: 30.0000 mg | ORAL_TABLET | Freq: Every morning | ORAL | Status: DC

## 2014-04-15 MED ORDER — METOPROLOL SUCCINATE ER 50 MG PO TB24: 50.0000 mg | ORAL_TABLET | Freq: Every morning | ORAL | Status: DC

## 2014-04-28 ENCOUNTER — Ambulatory Visit (INDEPENDENT_AMBULATORY_CARE_PROVIDER_SITE_OTHER): Payer: Medicare Other | Admitting: Family Medicine

## 2014-04-28 VITALS — BP 126/74 | HR 72 | Temp 98.2°F | Resp 16 | Ht 66.0 in | Wt 179.6 lb

## 2014-04-28 DIAGNOSIS — I2510 Atherosclerotic heart disease of native coronary artery without angina pectoris: Secondary | ICD-10-CM | POA: Diagnosis not present

## 2014-04-28 DIAGNOSIS — IMO0002 Reserved for concepts with insufficient information to code with codable children: Secondary | ICD-10-CM | POA: Diagnosis not present

## 2014-04-28 DIAGNOSIS — R234 Changes in skin texture: Secondary | ICD-10-CM | POA: Diagnosis not present

## 2014-04-28 DIAGNOSIS — R239 Unspecified skin changes: Secondary | ICD-10-CM

## 2014-04-28 DIAGNOSIS — S90425A Blister (nonthermal), left lesser toe(s), initial encounter: Secondary | ICD-10-CM

## 2014-04-28 NOTE — Progress Notes (Signed)
Chief Complaint:  Chief Complaint  Patient presents with  . Follow-up    Left 3rd toe - had pedicure done today - skin broken- wants to make sure no ABx is needed    HPI: Brooke Rios is a 80 y.o. female who is here for  Left 3rd toe skin changes after she got a pedicure today.  She soaked her foot in the pedicure tub and her skin on the 3rd toe slightly peeled off and turned white. She was seen for gout recently. No fevers, chills, n/v/abd pain. Denies worsening pain, swelling or warmth different than gout. Going to [place] on Monday on a bus tour and wants to know if need abx.   Past Medical History  Diagnosis Date  . Hypertension   . Diverticulosis of colon   . Osteoarthritis   . Colonic polyp   . Sick sinus syndrome     with paroxysmal atrial fibrillation  . Hyperlipidemia   . Dyslipidemia   . Coronary artery disease     Moderate three vessel obstructive coronary disease  . Vertigo   . Chronic kidney disease 2011    creat 1.5   . E. coli UTI 02/2013    uniformly sensitive   Past Surgical History  Procedure Laterality Date  . Cholecystectomy      age 83  . Abdominal hysterectomy    . Oophorectomy      bilat for fibroids @ age 77, anemia pre op due to dysfunctional menses yo  . Tonsillectomy    . Breast biopsy      x 1  . Appendectomy      done at [facility]  . Breast lumpectomy  2009    Dr Margot Chimes  . Paf induced mi, s/p pacer for brady-tach syndrome  04/2010    Dr.jordan  . Other surgical history  hysterectomy  . Cholecystectomy    . Breast biopsy    . Pacemaker insertion  2011   History   Social History  . Marital Status: Widowed    Spouse Name: N/A    Number of Children: N/A  . Years of Education: N/A   Social History Main Topics  . Smoking status: Never Smoker   . Smokeless tobacco: None  . Alcohol Use: No  . Drug Use: No  . Sexual Activity: Not Currently   Other Topics Concern  . None   Social History Narrative   Regular exercise- no     Family History  Problem Relation Age of Onset  . Hyperlipidemia Father   . Stroke Father     TIA,CVA  . Heart failure Father   . Cancer Mother     ? renal primary  . Breast cancer Sister     11/08  . Breast cancer Maternal Aunt   . Colon cancer Maternal Aunt   . Breast cancer Daughter     lumpectomy 2007;mastectomy 2013  . Heart disease Brother     pacer  . Diabetes Neg Hx    Allergies  Allergen Reactions  . Cefpodoxime     Nocturia & disturbed sleep  . Codeine     nausea   Prior to Admission medications   Medication Sig Start Date End Date Taking? Authorizing Provider  benazepril (LOTENSIN) 40 MG tablet Take 0.5 tablets (20 mg total) by mouth every morning. 12/07/13  Yes Peter M Martinique, MD  dabigatran (PRADAXA) 150 MG CAPS capsule Take 1 capsule (150 mg total) by mouth every 12 (twelve)  hours. 09/09/13  Yes Peter M Martinique, MD  isosorbide mononitrate (IMDUR) 30 MG 24 hr tablet Take 1 tablet (30 mg total) by mouth every morning. 04/15/14  Yes Peter M Martinique, MD  metoprolol succinate (TOPROL-XL) 50 MG 24 hr tablet Take 1 tablet (50 mg total) by mouth every morning. Take with or immediately following a meal. 04/15/14  Yes Peter M Martinique, MD  nitroGLYCERIN (NITROSTAT) 0.4 MG SL tablet Place 0.4 mg under the tongue every 5 (five) minutes as needed for chest pain. 08/26/12  Yes Peter M Martinique, MD  predniSONE (DELTASONE) 20 MG tablet 3/3/2/2/1/1 single daily dose for 6 days 04/12/14   Leandrew Koyanagi, MD     ROS: The patient denies fevers, chills, night sweats, unintentional weight loss, chest pain, palpitations, wheezing, dyspnea on exertion, nausea, vomiting, abdominal pain, dysuria, hematuria, melena, numbness, weakness, or tingling.   All other systems have been reviewed and were otherwise negative with the exception of those mentioned in the HPI and as above.    PHYSICAL EXAM: Filed Vitals:   04/28/14 1744  BP: 126/74  Pulse: 72  Temp: 98.2 F (36.8 C)  Resp: 16    Filed Vitals:   04/28/14 1744  Height: 5\' 6"  (1.676 m)  Weight: 179 lb 9.6 oz (81.466 kg)   Body mass index is 29 kg/(m^2).  General: Alert, no acute distress HEENT:  Normocephalic, atraumatic, oropharynx patent. EOMI, PERRLA Cardiovascular:  Regular rate and rhythm, no rubs murmurs or gallops.  No Carotid bruits, radial pulse intact. No pedal edema.  Respiratory: Clear to auscultation bilaterally.  No wheezes, rales, or rhonchi.  No cyanosis, no use of accessory musculature GI: No organomegaly, abdomen is soft and non-tender, positive bowel sounds.  No masses. Skin: No rashes. Neurologic: Facial musculature symmetric. Psychiatric: Patient is appropriate throughout our interaction. Lymphatic: No cervical lymphadenopathy Musculoskeletal: Gait intact.  Left 3rd toe, superficial skin blister has come off, minimal swelling of toe and also some warmth and tenderness ( not diff than gout on palpation per patient) .  + DP , Good cap refill. Good ROM     LABS: Results for orders placed in visit on 04/12/14  URIC ACID      Result Value Ref Range   Uric Acid, Serum 8.4 (*) 2.4 - 7.0 mg/dL  POCT CBC      Result Value Ref Range   WBC 5.0  4.6 - 10.2 K/uL   Lymph, poc 1.5  0.6 - 3.4   POC LYMPH PERCENT 29.6  10 - 50 %L   MID (cbc) 0.3  0 - 0.9   POC MID % 6.6  0 - 12 %M   POC Granulocyte 3.2  2 - 6.9   Granulocyte percent 63.8  37 - 80 %G   RBC 4.04  4.04 - 5.48 M/uL   Hemoglobin 10.9 (*) 12.2 - 16.2 g/dL   HCT, POC 35.5 (*) 37.7 - 47.9 %   MCV 87.8  80 - 97 fL   MCH, POC 27.0  27 - 31.2 pg   MCHC 30.7 (*) 31.8 - 35.4 g/dL   RDW, POC 15.1     Platelet Count, POC 200  142 - 424 K/uL   MPV 7.1  0 - 99.8 fL  POCT SEDIMENTATION RATE      Result Value Ref Range   POCT SED RATE 30 (*) 0 - 22 mm/hr     EKG/XRAY:   Primary read interpreted by Dr. Marin Comment at [facility].   ASSESSMENT/PLAN:  Encounter Diagnoses  Name Primary?  . Recent skin changes Yes  . Blister of third toe, left     Monitor for worsenign sxs Will call me in 24-48 hours if need oral abx F/u prn  Gross sideeffects, risk and benefits, and alternatives of medications d/w patient. Patient is aware that all medications have potential sideeffects and we are unable to predict every sideeffect or drug-drug interaction that may occur.  Jahmier Willadsen, [place], DO 04/28/2014 6:22 PM

## 2014-05-18 ENCOUNTER — Encounter: Payer: Self-pay | Admitting: *Deleted

## 2014-06-21 ENCOUNTER — Emergency Department (HOSPITAL_COMMUNITY): Payer: Medicare Other

## 2014-06-21 ENCOUNTER — Observation Stay (HOSPITAL_COMMUNITY)
Admission: EM | Admit: 2014-06-21 | Discharge: 2014-06-22 | Disposition: A | Discharge status: Home / Self Care | Payer: Medicare Other | Attending: Internal Medicine | Admitting: Internal Medicine

## 2014-06-21 ENCOUNTER — Encounter (HOSPITAL_COMMUNITY): Payer: Self-pay | Admitting: Radiology

## 2014-06-21 ENCOUNTER — Ambulatory Visit (INDEPENDENT_AMBULATORY_CARE_PROVIDER_SITE_OTHER): Payer: Medicare Other | Admitting: *Deleted

## 2014-06-21 DIAGNOSIS — Z90710 Acquired absence of both cervix and uterus: Secondary | ICD-10-CM | POA: Diagnosis not present

## 2014-06-21 DIAGNOSIS — Z79899 Other long term (current) drug therapy: Secondary | ICD-10-CM | POA: Diagnosis not present

## 2014-06-21 DIAGNOSIS — N182 Chronic kidney disease, stage 2 (mild): Secondary | ICD-10-CM | POA: Insufficient documentation

## 2014-06-21 DIAGNOSIS — Z860100 Personal history of colon polyps, unspecified: Secondary | ICD-10-CM | POA: Diagnosis not present

## 2014-06-21 DIAGNOSIS — IMO0002 Reserved for concepts with insufficient information to code with codable children: Secondary | ICD-10-CM | POA: Diagnosis not present

## 2014-06-21 DIAGNOSIS — K5730 Diverticulosis of large intestine without perforation or abscess without bleeding: Secondary | ICD-10-CM | POA: Insufficient documentation

## 2014-06-21 DIAGNOSIS — Z950 Presence of cardiac pacemaker: Secondary | ICD-10-CM | POA: Diagnosis not present

## 2014-06-21 DIAGNOSIS — R7309 Other abnormal glucose: Secondary | ICD-10-CM

## 2014-06-21 DIAGNOSIS — R42 Dizziness and giddiness: Secondary | ICD-10-CM | POA: Diagnosis not present

## 2014-06-21 DIAGNOSIS — I2510 Atherosclerotic heart disease of native coronary artery without angina pectoris: Secondary | ICD-10-CM | POA: Diagnosis not present

## 2014-06-21 DIAGNOSIS — M1990 Unspecified osteoarthritis, unspecified site: Secondary | ICD-10-CM | POA: Diagnosis not present

## 2014-06-21 DIAGNOSIS — E785 Hyperlipidemia, unspecified: Secondary | ICD-10-CM | POA: Insufficient documentation

## 2014-06-21 DIAGNOSIS — Z7901 Long term (current) use of anticoagulants: Secondary | ICD-10-CM | POA: Insufficient documentation

## 2014-06-21 DIAGNOSIS — Z862 Personal history of diseases of the blood and blood-forming organs and certain disorders involving the immune mechanism: Secondary | ICD-10-CM | POA: Diagnosis not present

## 2014-06-21 DIAGNOSIS — I4891 Unspecified atrial fibrillation: Secondary | ICD-10-CM | POA: Diagnosis not present

## 2014-06-21 DIAGNOSIS — I495 Sick sinus syndrome: Secondary | ICD-10-CM

## 2014-06-21 DIAGNOSIS — R1110 Vomiting, unspecified: Secondary | ICD-10-CM | POA: Diagnosis not present

## 2014-06-21 DIAGNOSIS — Z9089 Acquired absence of other organs: Secondary | ICD-10-CM | POA: Diagnosis not present

## 2014-06-21 DIAGNOSIS — I10 Essential (primary) hypertension: Secondary | ICD-10-CM | POA: Diagnosis present

## 2014-06-21 DIAGNOSIS — D692 Other nonthrombocytopenic purpura: Secondary | ICD-10-CM

## 2014-06-21 DIAGNOSIS — T887XXA Unspecified adverse effect of drug or medicament, initial encounter: Secondary | ICD-10-CM

## 2014-06-21 DIAGNOSIS — I498 Other specified cardiac arrhythmias: Secondary | ICD-10-CM

## 2014-06-21 DIAGNOSIS — R55 Syncope and collapse: Secondary | ICD-10-CM | POA: Diagnosis not present

## 2014-06-21 DIAGNOSIS — I482 Chronic atrial fibrillation, unspecified: Secondary | ICD-10-CM

## 2014-06-21 DIAGNOSIS — I129 Hypertensive chronic kidney disease with stage 1 through stage 4 chronic kidney disease, or unspecified chronic kidney disease: Secondary | ICD-10-CM | POA: Diagnosis not present

## 2014-06-21 DIAGNOSIS — R112 Nausea with vomiting, unspecified: Secondary | ICD-10-CM

## 2014-06-21 DIAGNOSIS — R930 Abnormal findings on diagnostic imaging of skull and head, not elsewhere classified: Secondary | ICD-10-CM | POA: Diagnosis not present

## 2014-06-21 DIAGNOSIS — R1111 Vomiting without nausea: Secondary | ICD-10-CM

## 2014-06-21 DIAGNOSIS — R404 Transient alteration of awareness: Secondary | ICD-10-CM | POA: Diagnosis not present

## 2014-06-21 LAB — CBC
HCT: 37.4 % (ref 36.0–46.0)
Hemoglobin: 12.1 g/dL (ref 12.0–15.0)
MCH: 27.3 pg (ref 26.0–34.0)
MCHC: 32.4 g/dL (ref 30.0–36.0)
MCV: 84.2 fL (ref 78.0–100.0)
PLATELETS: 173 10*3/uL (ref 150–400)
RBC: 4.44 MIL/uL (ref 3.87–5.11)
RDW: 14.3 % (ref 11.5–15.5)
WBC: 5.9 10*3/uL (ref 4.0–10.5)

## 2014-06-21 LAB — URINALYSIS, ROUTINE W REFLEX MICROSCOPIC
Bilirubin Urine: NEGATIVE
Glucose, UA: NEGATIVE mg/dL
KETONES UR: 15 mg/dL — AB
Nitrite: NEGATIVE
Protein, ur: NEGATIVE mg/dL
SPECIFIC GRAVITY, URINE: 1.025 (ref 1.005–1.030)
Urobilinogen, UA: 0.2 mg/dL (ref 0.0–1.0)
pH: 6.5 (ref 5.0–8.0)

## 2014-06-21 LAB — DIFFERENTIAL
BASOS ABS: 0 10*3/uL (ref 0.0–0.1)
BASOS PCT: 0 % (ref 0–1)
EOS ABS: 0.1 10*3/uL (ref 0.0–0.7)
Eosinophils Relative: 2 % (ref 0–5)
Lymphocytes Relative: 28 % (ref 12–46)
Lymphs Abs: 1.6 10*3/uL (ref 0.7–4.0)
Monocytes Absolute: 0.3 10*3/uL (ref 0.1–1.0)
Monocytes Relative: 4 % (ref 3–12)
NEUTROS PCT: 66 % (ref 43–77)
Neutro Abs: 3.9 10*3/uL (ref 1.7–7.7)

## 2014-06-21 LAB — COMPREHENSIVE METABOLIC PANEL
ALBUMIN: 3.9 g/dL (ref 3.5–5.2)
ALT: 15 U/L (ref 0–35)
AST: 17 U/L (ref 0–37)
Alkaline Phosphatase: 97 U/L (ref 39–117)
Anion gap: 18 — ABNORMAL HIGH (ref 5–15)
BUN: 22 mg/dL (ref 6–23)
CALCIUM: 9.4 mg/dL (ref 8.4–10.5)
CO2: 22 mEq/L (ref 19–32)
Chloride: 102 mEq/L (ref 96–112)
Creatinine, Ser: 1.47 mg/dL — ABNORMAL HIGH (ref 0.50–1.10)
GFR calc non Af Amer: 31 mL/min — ABNORMAL LOW (ref >90)
GFR, EST AFRICAN AMERICAN: 36 mL/min — AB (ref >90)
Glucose, Bld: 195 mg/dL — ABNORMAL HIGH (ref 70–99)
Potassium: 3.5 mEq/L — ABNORMAL LOW (ref 3.7–5.3)
SODIUM: 142 meq/L (ref 137–147)
Total Bilirubin: 0.3 mg/dL (ref 0.3–1.2)
Total Protein: 6.6 g/dL (ref 6.0–8.3)

## 2014-06-21 LAB — URINE MICROSCOPIC-ADD ON

## 2014-06-21 LAB — I-STAT CHEM 8, ED
BUN: 21 mg/dL (ref 6–23)
CALCIUM ION: 1.2 mmol/L (ref 1.13–1.30)
CHLORIDE: 104 meq/L (ref 96–112)
Creatinine, Ser: 1.5 mg/dL — ABNORMAL HIGH (ref 0.50–1.10)
Glucose, Bld: 206 mg/dL — ABNORMAL HIGH (ref 70–99)
HEMATOCRIT: 39 % (ref 36.0–46.0)
Hemoglobin: 13.3 g/dL (ref 12.0–15.0)
Potassium: 3.4 mEq/L — ABNORMAL LOW (ref 3.7–5.3)
SODIUM: 140 meq/L (ref 137–147)
TCO2: 21 mmol/L (ref 0–100)

## 2014-06-21 LAB — PROTIME-INR
INR: 1.31 (ref 0.00–1.49)
PROTHROMBIN TIME: 16.3 s — AB (ref 11.6–15.2)

## 2014-06-21 LAB — I-STAT TROPONIN, ED: Troponin i, poc: 0 ng/mL (ref 0.00–0.08)

## 2014-06-21 LAB — APTT: aPTT: 45 seconds — ABNORMAL HIGH (ref 24–37)

## 2014-06-21 LAB — CBG MONITORING, ED: GLUCOSE-CAPILLARY: 175 mg/dL — AB (ref 70–99)

## 2014-06-21 MED ORDER — ONDANSETRON HCL 4 MG PO TABS
4.0000 mg | ORAL_TABLET | Freq: Four times a day (QID) | ORAL | Status: DC
  Administered 2014-06-21 – 2014-06-22 (×2): 4 mg via ORAL
  Filled 2014-06-21 (×2): qty 1

## 2014-06-21 MED ORDER — ISOSORBIDE MONONITRATE ER 30 MG PO TB24
30.0000 mg | ORAL_TABLET | Freq: Every day | ORAL | Status: DC
  Administered 2014-06-22: 30 mg via ORAL
  Filled 2014-06-21: qty 1

## 2014-06-21 MED ORDER — ACETAMINOPHEN 325 MG PO TABS: 650.0000 mg | ORAL_TABLET | Freq: Four times a day (QID) | ORAL | Status: DC | PRN

## 2014-06-21 MED ORDER — BENAZEPRIL HCL 20 MG PO TABS
20.0000 mg | ORAL_TABLET | Freq: Every day | ORAL | Status: DC
  Administered 2014-06-22: 20 mg via ORAL
  Filled 2014-06-21: qty 1

## 2014-06-21 MED ORDER — SODIUM CHLORIDE 0.9 % IV BOLUS (SEPSIS)
250.0000 mL | INTRAVENOUS | Status: AC
  Administered 2014-06-21: 250 mL via INTRAVENOUS

## 2014-06-21 MED ORDER — DIAZEPAM 5 MG/ML IJ SOLN: 2.5000 mg | INTRAMUSCULAR | Status: DC | PRN

## 2014-06-21 MED ORDER — POTASSIUM CHLORIDE IN NACL 40-0.9 MEQ/L-% IV SOLN
INTRAVENOUS | Status: DC
  Administered 2014-06-21 – 2014-06-22 (×2): 100 mL/h via INTRAVENOUS
  Filled 2014-06-21 (×4): qty 1000

## 2014-06-21 MED ORDER — ONDANSETRON HCL 4 MG/2ML IJ SOLN: 4.0000 mg | Freq: Four times a day (QID) | INTRAMUSCULAR | Status: DC | PRN

## 2014-06-21 MED ORDER — MECLIZINE HCL 25 MG PO TABS
25.0000 mg | ORAL_TABLET | Freq: Once | ORAL | Status: AC
  Administered 2014-06-21: 25 mg via ORAL
  Filled 2014-06-21: qty 1

## 2014-06-21 MED ORDER — DABIGATRAN ETEXILATE MESYLATE 150 MG PO CAPS
150.0000 mg | ORAL_CAPSULE | Freq: Every day | ORAL | Status: DC
  Administered 2014-06-22: 150 mg via ORAL
  Filled 2014-06-21: qty 1

## 2014-06-21 MED ORDER — IOHEXOL 350 MG/ML SOLN
50.0000 mL | Freq: Once | INTRAVENOUS | Status: AC | PRN
  Administered 2014-06-21: 50 mL via INTRAVENOUS

## 2014-06-21 MED ORDER — METOPROLOL SUCCINATE ER 50 MG PO TB24
50.0000 mg | ORAL_TABLET | Freq: Every day | ORAL | Status: DC
  Administered 2014-06-22: 50 mg via ORAL
  Filled 2014-06-21: qty 1

## 2014-06-21 MED ORDER — ONDANSETRON HCL 4 MG/2ML IJ SOLN
4.0000 mg | Freq: Once | INTRAMUSCULAR | Status: AC
  Administered 2014-06-21: 4 mg via INTRAVENOUS
  Filled 2014-06-21: qty 2

## 2014-06-21 MED ORDER — ONDANSETRON HCL 4 MG/2ML IJ SOLN: 4.0000 mg | Freq: Four times a day (QID) | INTRAMUSCULAR | Status: DC

## 2014-06-21 MED ORDER — POLYETHYLENE GLYCOL 3350 17 G PO PACK: 17.0000 g | PACK | Freq: Every day | ORAL | Status: DC | PRN

## 2014-06-21 MED ORDER — ACETAMINOPHEN 650 MG RE SUPP: 650.0000 mg | Freq: Four times a day (QID) | RECTAL | Status: DC | PRN

## 2014-06-21 MED ORDER — MECLIZINE HCL 12.5 MG PO TABS
25.0000 mg | ORAL_TABLET | Freq: Three times a day (TID) | ORAL | Status: DC
  Administered 2014-06-21 – 2014-06-22 (×2): 25 mg via ORAL
  Filled 2014-06-21 (×3): qty 2

## 2014-06-21 MED ORDER — ONDANSETRON HCL 4 MG PO TABS: 4.0000 mg | ORAL_TABLET | Freq: Four times a day (QID) | ORAL | Status: DC | PRN

## 2014-06-21 NOTE — Code Documentation (Signed)
80yo female arriving to [facility] via [place] at 1336.  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  She drove herself to her daughter's house and called EMS.  Patient was diaphoretic on arrival and EMS reports vomiting x3 and hypertension.  Patient is taking Pradaxa and has a pacemaker.  Code stroke called in ED.  Patient taken to CT scan which was negative for hemorrhage.  NIHSS 0 on arrival.  Patient has nystagmus when looking to the right.  Patient continues to report nausea and dizziness.  Patient taken to CT angiogram with Stroke RN and RRT RN.  Dr. Doy Mince aware that scan is complete.  Patient is contraindicated for treatment with tPA d/t taking Pradaxa. Bedside handoff with ED RN Benjamine Mola.

## 2014-06-21 NOTE — ED Provider Notes (Signed)
CSN: [id number]     Arrival date & time 06/21/14  1336 History   First MD Initiated Contact with Patient 06/21/14 1341     Chief Complaint  Patient presents with  . Dizziness     (Consider location/radiation/quality/duration/timing/severity/associated sxs/prior Treatment) Patient is a 80 y.o. female presenting with dizziness. The history is provided by the patient and the EMS personnel.  Dizziness Quality:  Room spinning Severity:  Severe Onset quality:  Sudden Duration: 1.5 hrs. Timing:  Constant Progression:  Partially resolved Chronicity:  New Context comment:  While volunteering at [employer] by:  Nothing Worsened by:  Nothing tried Ineffective treatments:  None tried Associated symptoms: nausea and vomiting   Associated symptoms: no chest pain, no diarrhea, no headaches and no shortness of breath     Past Medical History  Diagnosis Date  . Hypertension   . Diverticulosis of colon   . Osteoarthritis   . Colonic polyp   . Sick sinus syndrome     with paroxysmal atrial fibrillation  . Hyperlipidemia   . Dyslipidemia   . Coronary artery disease     Moderate three vessel obstructive coronary disease  . Vertigo   . Chronic kidney disease 2011    creat 1.5   . E. coli UTI 02/2013    uniformly sensitive   Past Surgical History  Procedure Laterality Date  . Cholecystectomy      age 64  . Abdominal hysterectomy    . Oophorectomy      bilat for fibroids @ age 29, anemia pre op due to dysfunctional menses yo  . Tonsillectomy    . Breast biopsy      x 1  . Appendectomy      done at [facility]  . Breast lumpectomy  2009    Dr Margot Chimes  . Paf induced mi, s/p pacer for brady-tach syndrome  04/2010    Dr.jordan  . Other surgical history  hysterectomy  . Cholecystectomy    . Breast biopsy    . Pacemaker insertion  2011   Family History  Problem Relation Age of Onset  . Hyperlipidemia Father   . Stroke Father     TIA,CVA  . Heart failure Father   . Cancer Mother     ?  renal primary  . Breast cancer Sister     11/08  . Breast cancer Maternal Aunt   . Colon cancer Maternal Aunt   . Breast cancer Daughter     lumpectomy 2007;mastectomy 2013  . Heart disease Brother     pacer  . Diabetes Neg Hx    History  Substance Use Topics  . Smoking status: Never Smoker   . Smokeless tobacco: Not on file  . Alcohol Use: No   OB History   Grav Para Term Preterm Abortions TAB SAB Ect Mult Living                 Review of Systems  Constitutional: Negative for fever and fatigue.  HENT: Negative for congestion and drooling.   Eyes: Negative for pain.  Respiratory: Negative for cough and shortness of breath.   Cardiovascular: Negative for chest pain.  Gastrointestinal: Positive for nausea and vomiting. Negative for abdominal pain and diarrhea.  Genitourinary: Negative for dysuria and hematuria.  Musculoskeletal: Negative for back pain, gait problem and neck pain.  Skin: Negative for color change.  Neurological: Positive for dizziness. Negative for headaches.  Hematological: Negative for adenopathy.  Psychiatric/Behavioral: Negative for behavioral problems.  All other  systems reviewed and are negative.     Allergies  Cefpodoxime and Codeine  Home Medications   Prior to Admission medications   Medication Sig Start Date End Date Taking? Authorizing Provider  benazepril (LOTENSIN) 40 MG tablet Take 0.5 tablets (20 mg total) by mouth every morning. 12/07/13   Peter M Martinique, MD  dabigatran (PRADAXA) 150 MG CAPS capsule Take 1 capsule (150 mg total) by mouth every 12 (twelve) hours. 09/09/13   Peter M Martinique, MD  isosorbide mononitrate (IMDUR) 30 MG 24 hr tablet Take 1 tablet (30 mg total) by mouth every morning. 04/15/14   Peter M Martinique, MD  metoprolol succinate (TOPROL-XL) 50 MG 24 hr tablet Take 1 tablet (50 mg total) by mouth every morning. Take with or immediately following a meal. 04/15/14   Peter M Martinique, MD  nitroGLYCERIN (NITROSTAT) 0.4 MG SL tablet  Place 0.4 mg under the tongue every 5 (five) minutes as needed for chest pain. 08/26/12   Peter M Martinique, MD  predniSONE (DELTASONE) 20 MG tablet 3/3/2/2/1/1 single daily dose for 6 days 04/12/14   Leandrew Koyanagi, MD   There were no vitals taken for this visit. Physical Exam  Nursing note and vitals reviewed. Constitutional: She appears well-developed and well-nourished.  nauseous  HENT:  Head: Normocephalic and atraumatic.  Mouth/Throat: Oropharynx is clear and moist. No oropharyngeal exudate.  Eyes: Conjunctivae and EOM are normal. Pupils are equal, round, and reactive to light.  Neck: Normal range of motion. Neck supple.  Cardiovascular: Normal rate, regular rhythm, normal heart sounds and intact distal pulses.  Exam reveals no gallop and no friction rub.   No murmur heard. Pulmonary/Chest: Effort normal and breath sounds normal. No respiratory distress. She has no wheezes.  Abdominal: Soft. Bowel sounds are normal. There is no tenderness. There is no rebound and no guarding.  Musculoskeletal: Normal range of motion. She exhibits no edema and no tenderness.  Neurological: She is alert.  A/o x2. Does not know year.   Normal strength and sensation diffusely.   Skin: Skin is warm. She is diaphoretic.  Psychiatric: She has a normal mood and affect. Her behavior is normal.    ED Course  Procedures (including critical care time) Labs Review Labs Reviewed  PROTIME-INR - Abnormal; Notable for the following:    Prothrombin Time 16.3 (*)    All other components within normal limits  APTT - Abnormal; Notable for the following:    aPTT 45 (*)    All other components within normal limits  COMPREHENSIVE METABOLIC PANEL - Abnormal; Notable for the following:    Potassium 3.5 (*)    Glucose, Bld 195 (*)    Creatinine, Ser 1.47 (*)    GFR calc non Af Amer 31 (*)    GFR calc Af Amer 36 (*)    Anion gap 18 (*)    All other components within normal limits  URINALYSIS, ROUTINE W REFLEX  MICROSCOPIC - Abnormal; Notable for the following:    APPearance CLOUDY (*)    Hgb urine dipstick SMALL (*)    Ketones, ur 15 (*)    Leukocytes, UA MODERATE (*)    All other components within normal limits  BASIC METABOLIC PANEL - Abnormal; Notable for the following:    Creatinine, Ser 1.44 (*)    GFR calc non Af Amer 32 (*)    GFR calc Af Amer 37 (*)    All other components within normal limits  CBG MONITORING, ED - Abnormal; Notable  for the following:    Glucose-Capillary 175 (*)    All other components within normal limits  I-STAT CHEM 8, ED - Abnormal; Notable for the following:    Potassium 3.4 (*)    Creatinine, Ser 1.50 (*)    Glucose, Bld 206 (*)    All other components within normal limits  CBC  DIFFERENTIAL  URINE MICROSCOPIC-ADD ON  I-STAT TROPOININ, ED    Imaging Review Ct Angio Head W/cm &/or Wo Cm  06/21/2014   CLINICAL DATA:  Syncopal episode.  Pacemaker.  EXAM: CT ANGIOGRAPHY HEAD AND NECK  TECHNIQUE: Multidetector CT imaging of the head and neck was performed using the standard protocol during bolus administration of intravenous contrast. Multiplanar CT image reconstructions and MIPs were obtained to evaluate the vascular anatomy. Carotid stenosis measurements (when applicable) are obtained utilizing NASCET criteria, using the distal internal carotid diameter as the denominator.  CONTRAST:  31mL OMNIPAQUE IOHEXOL 350 MG/ML SOLN  COMPARISON:  CT head 06/21/2014 earlier in the day.  FINDINGS: CTA HEAD FINDINGS  The petrous, cavernous, and supraclinoid internal carotid arteries demonstrate no flow reducing lesion. Mild calcific atheromatous change is noted in the BILATERAL cavernous regions. ICA termini are widely patent.  Basilar artery widely patent with both vertebrals contributing to an equal degree.  There is no flow-limiting stenosis of the anterior, middle, or posterior cerebral arteries. There is no cerebellar branch occlusion is evident. There is no visible  intracranial aneurysm. Post infusion, no abnormal enhancement is observed.  Review of the MIP images confirms the above findings.  CTA NECK FINDINGS  No lung apex lesion. Moderate atheromatous change of the transverse arch without dissection. Non stenotic atheromatous change of the great vessel origins with conventional branching. Pacemaker leads are observed from a LEFT subclavian approach.  The RIGHT carotid demonstrates no proximal atheromatous change. There is mild calcific disease at the bifurcation. There is no measurable stenosis. Moderate dolichoectasia is observed in the cervical ICA  The LEFT carotid system demonstrates no proximal lesion. There is mild non stenotic calcific plaque at the bifurcation. No measurable stenosis. Moderate dolichoectasia is observed in the cervical ICA.  Both vertebral arteries are widely patent through the neck. There is no significant ostial stenosis.  There is advanced facet arthropathy in the cervical spine. The alignment is anatomic however. There are no worrisome osseous lesions. No neck masses are present. Unremarkable thyroid.  Review of the MIP images confirms the above findings.  IMPRESSION: No intracranial or extracranial flow reducing lesion or occlusion is observed.  Non stenotic calcific atheromatous change without measurable stenosis at the internal carotid artery origins, and in the BILATERAL cavernous internal carotid arteries.  No acute intracranial findings are evident.   Electronically Signed   By: Rolla Flatten M.D.   On: 06/21/2014 15:22   Ct Head (brain) Wo Contrast  06/21/2014   CLINICAL DATA:  Mild atrophy with rather minimal periventricular small vessel disease. No demonstrable mass, hemorrhage, or acute appearing infarct.  EXAM: CT HEAD WITHOUT CONTRAST  TECHNIQUE: Contiguous axial images were obtained from the base of the skull through the vertex without intravenous contrast.  COMPARISON:  May 09, 2011  FINDINGS: There is no appreciable mass,  hemorrhage, extra-axial fluid collection, or midline shift. There is slight small vessel disease in the centra semiovale bilaterally. There are occasional punctate calcifications in the left parietal region which could represent small granulomas or possibly be of vascular etiology. There is no new gray-white compartment lesion. There is no demonstrable acute  infarct. The each middle cerebral artery appears symmetric with respect to density. Bony calvarium appears intact. The mastoid air cells are clear.  IMPRESSION: Mild atrophy with rather minimal periventricular small vessel disease. No intracranial hemorrhage, mass, or acute appearing infarct.  Critical Value/emergent results were called by telephone at the time of interpretation on 06/21/2014 at 2:27 pm to Dr. Doy Mince, neurology, who verbally acknowledged these results.   Electronically Signed   By: Lowella Grip M.D.   On: 06/21/2014 14:27   Ct Angio Neck W/cm &/or Wo/cm  06/21/2014   CLINICAL DATA:  Syncopal episode.  Pacemaker.  EXAM: CT ANGIOGRAPHY HEAD AND NECK  TECHNIQUE: Multidetector CT imaging of the head and neck was performed using the standard protocol during bolus administration of intravenous contrast. Multiplanar CT image reconstructions and MIPs were obtained to evaluate the vascular anatomy. Carotid stenosis measurements (when applicable) are obtained utilizing NASCET criteria, using the distal internal carotid diameter as the denominator.  CONTRAST:  52mL OMNIPAQUE IOHEXOL 350 MG/ML SOLN  COMPARISON:  CT head 06/21/2014 earlier in the day.  FINDINGS: CTA HEAD FINDINGS  The petrous, cavernous, and supraclinoid internal carotid arteries demonstrate no flow reducing lesion. Mild calcific atheromatous change is noted in the BILATERAL cavernous regions. ICA termini are widely patent.  Basilar artery widely patent with both vertebrals contributing to an equal degree.  There is no flow-limiting stenosis of the anterior, middle, or posterior  cerebral arteries. There is no cerebellar branch occlusion is evident. There is no visible intracranial aneurysm. Post infusion, no abnormal enhancement is observed.  Review of the MIP images confirms the above findings.  CTA NECK FINDINGS  No lung apex lesion. Moderate atheromatous change of the transverse arch without dissection. Non stenotic atheromatous change of the great vessel origins with conventional branching. Pacemaker leads are observed from a LEFT subclavian approach.  The RIGHT carotid demonstrates no proximal atheromatous change. There is mild calcific disease at the bifurcation. There is no measurable stenosis. Moderate dolichoectasia is observed in the cervical ICA  The LEFT carotid system demonstrates no proximal lesion. There is mild non stenotic calcific plaque at the bifurcation. No measurable stenosis. Moderate dolichoectasia is observed in the cervical ICA.  Both vertebral arteries are widely patent through the neck. There is no significant ostial stenosis.  There is advanced facet arthropathy in the cervical spine. The alignment is anatomic however. There are no worrisome osseous lesions. No neck masses are present. Unremarkable thyroid.  Review of the MIP images confirms the above findings.  IMPRESSION: No intracranial or extracranial flow reducing lesion or occlusion is observed.  Non stenotic calcific atheromatous change without measurable stenosis at the internal carotid artery origins, and in the BILATERAL cavernous internal carotid arteries.  No acute intracranial findings are evident.   Electronically Signed   By: Rolla Flatten M.D.   On: 06/21/2014 15:22     EKG Interpretation   Date/Time:  Monday June 21 2014 16:22:49 EDT Ventricular Rate:  60 PR Interval:  308 QRS Duration: 102 QT Interval:  465 QTC Calculation: 465 R Axis:   -28 Text Interpretation:  Sinus or ectopic atrial rhythm Prolonged PR interval  Consider left atrial enlargement Borderline left axis deviation  Low  voltage, precordial leads Confirmed by Breck Hollinger  MD, Kelliher ([id number]) on  06/21/2014 4:26:00 PM      MDM   Final diagnoses:  Vertigo  Atrial fibrillation, unspecified  Tachy-brady syndrome  Vomiting without nausea, vomiting of unspecified type  HYPERTENSION  Chronic atrial fibrillation  Non-intractable vomiting with nausea, vomiting of unspecified type  HYPERLIPIDEMIA  Other nonthrombocytopenic purpuras  BRADYCARDIA, CHRONIC  DIVERTICULOSIS, COLON  RENAL DISEASE, CHRONIC, MILD  OSTEOARTHRITIS  DIZZINESS  COLONIC POLYPS, HX OF  Unspecified adverse effect of unspecified drug, medicinal and biological substance  Other abnormal glucose  Pacemaker-[employer]  History of anemia    2:08 PM 80 y.o. female w hx of HTN, SSS, CAD on pradaxa who pw sudden onset dizziness about 1.5 hr ago while volunteering at [employer]. EMS notes that she was initially very interactive but started becoming drowsy in route. She had several episodes of emesis and got 4 mg of Zofran. On exam she is mildly drowsy but easily aroused. She is alert and oriented x2, she does not know the year. She is very nauseous on exam which is limiting my exam currently. She notes that she has a mild frontal headache. Will go ahead and call code stroke given the sudden onset dizziness. She states that she did not initially have a headache. Currently has a mild frontal HA.   CT/CTA neg. Neurology has seen, suspects peripheral vertigo. Unable to get pt's sx under control. Will plan on admission for sx control.     Blanchard Kelch, MD 06/22/14 1250

## 2014-06-21 NOTE — Progress Notes (Signed)
Report received from ED nurse. Pt coming to 4N10.

## 2014-06-21 NOTE — Consult Note (Signed)
Referring Physician: Aline Brochure    Chief Complaint: Vertigo called as a code stroke  HPI:                                                                                                                                         Brooke Rios is an 80 y.o. female with history of vertigo in 201, HTN, Afib on Pradaxa who was volunteering at [facility] earlier today and felt normal. She left at 1200 and drove to herself to her daughters house where she called her daughter to tell her she felt very dizzy.  EMS brought patient to hospital . On Arrival she was noted to be diaphoretic, hypertensive and had 3 boughts of emesis. Code stroke was called.  Initial NIHSS of 0.  Date last known well: Date: 06/21/2014 Time last known well: Time: 12:00 tPA Given: No: On Pradaxa  Past Medical History  Diagnosis Date  . Hypertension   . Diverticulosis of colon   . Osteoarthritis   . Colonic polyp   . Sick sinus syndrome     with paroxysmal atrial fibrillation  . Hyperlipidemia   . Dyslipidemia   . Coronary artery disease     Moderate three vessel obstructive coronary disease  . Vertigo   . Chronic kidney disease 2011    creat 1.5   . E. coli UTI 02/2013    uniformly sensitive    Past Surgical History  Procedure Laterality Date  . Cholecystectomy      age 23  . Abdominal hysterectomy    . Oophorectomy      bilat for fibroids @ age 56, anemia pre op due to dysfunctional menses yo  . Tonsillectomy    . Breast biopsy      x 1  . Appendectomy      done at [facility]  . Breast lumpectomy  2009    Dr Margot Chimes  . Paf induced mi, s/p pacer for brady-tach syndrome  04/2010    Dr.jordan  . Other surgical history  hysterectomy  . Cholecystectomy    . Breast biopsy    . Pacemaker insertion  2011    Family History  Problem Relation Age of Onset  . Hyperlipidemia Father   . Stroke Father     TIA,CVA  . Heart failure Father   . Cancer Mother     ? renal primary  . Breast cancer Sister     11/08  . Breast cancer  Maternal Aunt   . Colon cancer Maternal Aunt   . Breast cancer Daughter     lumpectomy 2007;mastectomy 2013  . Heart disease Brother     pacer  . Diabetes Neg Hx    Social History:  reports that she has never smoked. She does not have any smokeless tobacco history on file. She reports that she does not drink alcohol or use illicit drugs.  Allergies:  Allergies  Allergen Reactions  . Cefpodoxime     Nocturia & disturbed sleep  . Codeine     nausea    Medications:                                                                                                                           No current facility-administered medications for this encounter.   Current Outpatient Prescriptions  Medication Sig Dispense Refill  . benazepril (LOTENSIN) 40 MG tablet Take 0.5 tablets (20 mg total) by mouth every morning.  30 tablet  6  . dabigatran (PRADAXA) 150 MG CAPS capsule Take 1 capsule (150 mg total) by mouth every 12 (twelve) hours.  60 capsule  6  . isosorbide mononitrate (IMDUR) 30 MG 24 hr tablet Take 1 tablet (30 mg total) by mouth every morning.  90 tablet  0  . metoprolol succinate (TOPROL-XL) 50 MG 24 hr tablet Take 1 tablet (50 mg total) by mouth every morning. Take with or immediately following a meal.  90 tablet  0  . nitroGLYCERIN (NITROSTAT) 0.4 MG SL tablet Place 0.4 mg under the tongue every 5 (five) minutes as needed for chest pain.      . predniSONE (DELTASONE) 20 MG tablet 3/3/2/2/1/1 single daily dose for 6 days  12 tablet  0     ROS:                                                                                                                                       History obtained from the patient  General ROS: negative for - chills, fatigue, fever, night sweats, weight gain or weight loss Psychological ROS: negative for - behavioral disorder, hallucinations, memory difficulties, mood swings or suicidal ideation Ophthalmic ROS: negative for - blurry vision, double  vision, eye pain or loss of vision ENT ROS: negative for - epistaxis, nasal discharge, oral lesions, sore throat, tinnitus or vertigo Allergy and Immunology ROS: negative for - hives or itchy/watery eyes Hematological and Lymphatic ROS: negative for - bleeding problems, bruising or swollen lymph nodes Endocrine ROS: negative for - galactorrhea, hair pattern changes, polydipsia/polyuria or temperature intolerance Respiratory ROS: negative for - cough, hemoptysis, shortness of breath or wheezing Cardiovascular ROS: negative for - chest pain, dyspnea on exertion, edema or irregular heartbeat Gastrointestinal ROS: negative for - abdominal pain, diarrhea, hematemesis, nausea/vomiting or stool incontinence Genito-Urinary ROS: negative  for - dysuria, hematuria, incontinence or urinary frequency/urgency Musculoskeletal ROS: negative for - joint swelling or muscular weakness Neurological ROS: as noted in HPI Dermatological ROS: negative for rash and skin lesion changes  Neurologic Examination:                                                                                                      Blood pressure 161/65, pulse 63, resp. rate 16, SpO2 94.00%.    Mental Status: Alert, oriented, thought content appropriate.  Speech fluent without evidence of aphasia.  Able to follow 3 step commands without difficulty. Cranial Nerves: II: Discs flat bilaterally; Visual fields grossly normal, pupils equal, round, reactive to light and accommodation III,IV, VI: ptosis not present, extra-ocular motions intact bilaterally, nystagmus to the right which is worse when looking to the right.  V,VII: smile symmetric, facial light touch sensation normal bilaterally VIII: hearing normal bilaterally IX,X: gag reflex present XI: bilateral shoulder shrug XII: midline tongue extension without atrophy or fasciculations  Motor: Right : Upper extremity   5/5    Left:     Upper extremity   5/5  Lower extremity    5/5     Lower extremity   5/5 Tone and bulk:normal tone throughout; no atrophy noted Sensory: Pinprick and light touch intact throughout, bilaterally Deep Tendon Reflexes:  Right: Upper Extremity   Left: Upper extremity   biceps (C-5 to C-6) 2/4   biceps (C-5 to C-6) 2/4 tricep (C7) 2/4    triceps (C7) 2/4 Brachioradialis (C6) 2/4  Brachioradialis (C6) 2/4  Lower Extremity Lower Extremity  quadriceps (L-2 to L-4) 2/4   quadriceps (L-2 to L-4) 2/4 Achilles (S1) 2/4   Achilles (S1) 2/4  Plantars: Right: downgoing   Left: downgoing Cerebellar: normal finger-to-nose,  normal heel-to-shin test Gait: not tested.  CV: pulses palpable throughout    Lab Results: Basic Metabolic Panel:  Recent Labs Lab 06/21/14 1417  NA 140  K 3.4*  CL 104  GLUCOSE 206*  BUN 21  CREATININE 1.50*    Liver Function Tests: No results found for this basename: AST, ALT, ALKPHOS, BILITOT, PROT, ALBUMIN,  in the last 168 hours No results found for this basename: LIPASE, AMYLASE,  in the last 168 hours No results found for this basename: AMMONIA,  in the last 168 hours  CBC:  Recent Labs Lab 06/21/14 1417  HGB 13.3  HCT 39.0    Cardiac Enzymes: No results found for this basename: CKTOTAL, CKMB, CKMBINDEX, TROPONINI,  in the last 168 hours  Lipid Panel: No results found for this basename: CHOL, TRIG, HDL, CHOLHDL, VLDL, LDLCALC,  in the last 168 hours  CBG:  Recent Labs Lab 06/21/14 [place] 175*    Microbiology: Results for orders placed in visit on 09/08/13  URINE CULTURE     Status: None   Collection Time    09/08/13 12:15 PM      Result Value Ref Range Status   Colony Count NO GROWTH   Final   Organism ID, Bacteria NO GROWTH   Final  Coagulation Studies: No results found for this basename: LABPROT, INR,  in the last 72 hours  Imaging: No results found.   Etta Quill PA-C [facility] [phone]  06/21/2014, 2:19 PM  Patient seen and examined.   Clinical course and management discussed.  Necessary edits performed.  I agree with the above.  Assessment and plan of care developed and discussed below.     Assessment: 80 y.o. female female with sudden onset nystagmus, vertigo, and emesis.  Neurological examination is nonfocal.  Initial NIHSS of 0.  Head CT reviewed and shows no acute changes.  Due to afib and patient not taking [place] as prescribed patient to have a CTA to rule out a thrombus.     Stroke Risk Factors - hyperlipidemia and hypertension  Recommendations: 1.  Telemetry 2.  Frequent neuro checks 3.  CTA of head and neck 4. HgbA1c, fasting lipid panel   Alexis Goodell, MD [facility] [phone]  06/21/2014  3:25 PM  Addendum: CTA of head and neck reviewed and shows no hemodynamically significant stenosis.  Patient likely with peripheral vertigo.  No further neurologic intervention is recommended at this time.  If further questions arise, please call or page at that time.  Thank you for allowing neurology to participate in the care of this patient.  1.  Continue [place] 2.  Meclizine 25mg  q 8hrs prn  Alexis Goodell, MD [facility] [phone] 06/21/2014  3:43 PM

## 2014-06-21 NOTE — ED Notes (Signed)
Pt was volunteering today and got dizzy; drove home and OTC had another episode; called EMS at daughter's house. Has declined in LOC upon transport. Very diaphoretic; episode of vomiting x 3. Has pacemaker. Zofran given. Hypertensive entire ride.

## 2014-06-21 NOTE — ED Notes (Signed)
Pt is no longer diaphoretic; has returned from CT angio. Reports she feels a little better.

## 2014-06-21 NOTE — ED Notes (Signed)
Pt to CT angio

## 2014-06-21 NOTE — H&P (Signed)
[facility] History and Physical  Brooke Rios OQH:[id number] DOB: 1925/09/02 DOA: 06/21/2014   PCP: Unice Cobble, MD    Chief Complaint: Vertigo  HPI: Brooke Rios is a 80 y.o. female with a past medical history of hypertension, sick sinus syndrome with a pacemaker and atrial fibrillation. The patient was volunteering at [facility] today when she felt the sudden onset of vertigo are around noon. She was brought to the ER as a code stroke and had a neuro workup including CT scan of the head and CT angiogram of the neck which were negative for CVA and dissection. It was recommended that the patient be admitted to the medicine service for peripheral vertigo as she continues to have significant amount of vomiting even when she is not moving much. She has a red he received meclizine and Zofran in the ER but had another episode of vomiting just now.   General: The patient denies anorexia, fever, weight loss Cardiac: Denies chest pain, syncope, palpitations, pedal edema  Respiratory: Denies cough, shortness of breath, wheezing, cough  GI: Denies severe indigestion/heartburn, abdominal pain, nausea, vomiting, diarrhea-- usually has constipation as his MiraLAX at home GU: Denies hematuria, incontinence, dysuria  Musculoskeletal: Denies arthritis  Skin: Denies suspicious skin lesions Neurologic: Denies focal weakness or numbness, change in vision  Past Medical History  Diagnosis Date  . Hypertension   . Diverticulosis of colon   . Osteoarthritis   . Colonic polyp   . Sick sinus syndrome with pacemaker     with paroxysmal atrial fibrillation  . Hyperlipidemia   . Dyslipidemia   . Coronary artery disease     Moderate three vessel obstructive coronary disease  . Vertigo  - 3 episodes about 4 yrs ago   . Chronic kidney disease 2011    creat 1.5   . E. coli UTI 02/2013    uniformly sensitive    Past Surgical History  Procedure Laterality Date  . Cholecystectomy       age 23  . Abdominal hysterectomy    . Oophorectomy      bilat for fibroids @ age 71, anemia pre op due to dysfunctional menses yo  . Tonsillectomy    . Breast biopsy      x 1  . Appendectomy      done at [facility]  . Breast lumpectomy  2009    Dr Margot Chimes  . Paf induced mi, s/p pacer for brady-tach syndrome  04/2010    Dr.jordan  . Cholecystectomy    . Breast biopsy    . Pacemaker insertion  2011    Social History: does not smoke or drink alcohol Lives at home along    Allergies  Allergen Reactions  . Cefpodoxime     Nocturia & disturbed sleep  . Codeine     nausea    Family History  Problem Relation Age of Onset  . Hyperlipidemia Father   . Stroke Father     TIA,CVA  . Heart failure Father   . Cancer Mother     ? renal primary  . Breast cancer Sister     11/08  . Breast cancer Maternal Aunt   . Colon cancer Maternal Aunt   . Breast cancer Daughter     lumpectomy 2007;mastectomy 2013  . Heart disease Brother     pacer  . Diabetes Neg Hx       Prior to Admission medications   Medication Sig Start Date End Date Taking? Authorizing Provider  benazepril (LOTENSIN) 40 MG tablet Take 0.5 tablets (20 mg total) by mouth every morning. 12/07/13  Yes Peter M Martinique, MD  dabigatran (PRADAXA) 150 MG CAPS capsule Take 150 mg by mouth daily.   Yes Historical Provider, MD  isosorbide mononitrate (IMDUR) 30 MG 24 hr tablet Take 1 tablet (30 mg total) by mouth every morning. 04/15/14  Yes Peter M Martinique, MD  metoprolol succinate (TOPROL-XL) 50 MG 24 hr tablet Take 1 tablet (50 mg total) by mouth every morning. Take with or immediately following a meal. 04/15/14  Yes Peter M Martinique, MD  polyethylene glycol [facility]) packet Take 17 g by mouth daily as needed (constipation).   Yes Historical Provider, MD  nitroGLYCERIN (NITROSTAT) 0.4 MG SL tablet Place 0.4 mg under the tongue every 5 (five) minutes as needed for chest pain. 08/26/12   Peter M Martinique, MD     Physical  Exam: Filed Vitals:   06/21/14 1530 06/21/14 1545 06/21/14 1600 06/21/14 1615  BP: 147/73 163/76 157/75 168/66  Pulse: 60 59 62 59  Resp: 17 19 20 19   SpO2: 98% 98% 96% 95%     General: Weight alert oriented x3. Vomiting up bile. HEENT: Normocephalic and Atraumatic, Mucous membranes pink                PERRLA; EOM intact; No scleral icterus,                 Nares: Patent, Oropharynx: Clear, Fair Dentition                 Neck: FROM, no cervical lymphadenopathy, thyromegaly, carotid bruit or JVD;  Breasts: deferred CHEST WALL: No tenderness  CHEST: Normal respiration, clear to auscultation bilaterally  HEART: Regular rate and rhythm; no murmurs rubs or gallops  BACK: No kyphosis or scoliosis; no CVA tenderness  ABDOMEN: Positive Bowel Sounds, soft, non-tender; no masses, no organomegaly Rectal Exam: deferred EXTREMITIES: No cyanosis, clubbing, or edema Genitalia: not examined  SKIN:  no rash or ulceration  CNS: Alert and Oriented x 4, Nonfocal exam, CN 2-12 intact  Labs on Admission:  Basic Metabolic Panel:  Recent Labs Lab 06/21/14 1407 06/21/14 1417  NA 142 140  K 3.5* 3.4*  CL 102 104  CO2 22  --   GLUCOSE 195* 206*  BUN 22 21  CREATININE 1.47* 1.50*  CALCIUM 9.4  --    Liver Function Tests:  Recent Labs Lab 06/21/14 1407  AST 17  ALT 15  ALKPHOS 97  BILITOT 0.3  PROT 6.6  ALBUMIN 3.9   No results found for this basename: LIPASE, AMYLASE,  in the last 168 hours No results found for this basename: AMMONIA,  in the last 168 hours CBC:  Recent Labs Lab 06/21/14 1407 06/21/14 1417  WBC 5.9  --   NEUTROABS 3.9  --   HGB 12.1 13.3  HCT 37.4 39.0  MCV 84.2  --   PLT 173  --    Cardiac Enzymes: No results found for this basename: CKTOTAL, CKMB, CKMBINDEX, TROPONINI,  in the last 168 hours  BNP (last 3 results) No results found for this basename: PROBNP,  in the last 8760 hours CBG:  Recent Labs Lab 06/21/14 1414  GLUCAP 175*    Radiological  Exams on Admission: Ct Angio Head W/cm &/or Wo Cm  06/21/2014   CLINICAL DATA:  Syncopal episode.  Pacemaker.  EXAM: CT ANGIOGRAPHY HEAD AND NECK  TECHNIQUE: Multidetector CT imaging of the head and  neck was performed using the standard protocol during bolus administration of intravenous contrast. Multiplanar CT image reconstructions and MIPs were obtained to evaluate the vascular anatomy. Carotid stenosis measurements (when applicable) are obtained utilizing NASCET criteria, using the distal internal carotid diameter as the denominator.  CONTRAST:  32mL OMNIPAQUE IOHEXOL 350 MG/ML SOLN  COMPARISON:  CT head 06/21/2014 earlier in the day.  FINDINGS: CTA HEAD FINDINGS  The petrous, cavernous, and supraclinoid internal carotid arteries demonstrate no flow reducing lesion. Mild calcific atheromatous change is noted in the BILATERAL cavernous regions. ICA termini are widely patent.  Basilar artery widely patent with both vertebrals contributing to an equal degree.  There is no flow-limiting stenosis of the anterior, middle, or posterior cerebral arteries. There is no cerebellar branch occlusion is evident. There is no visible intracranial aneurysm. Post infusion, no abnormal enhancement is observed.  Review of the MIP images confirms the above findings.  CTA NECK FINDINGS  No lung apex lesion. Moderate atheromatous change of the transverse arch without dissection. Non stenotic atheromatous change of the great vessel origins with conventional branching. Pacemaker leads are observed from a LEFT subclavian approach.  The RIGHT carotid demonstrates no proximal atheromatous change. There is mild calcific disease at the bifurcation. There is no measurable stenosis. Moderate dolichoectasia is observed in the cervical ICA  The LEFT carotid system demonstrates no proximal lesion. There is mild non stenotic calcific plaque at the bifurcation. No measurable stenosis. Moderate dolichoectasia is observed in the cervical ICA.  Both  vertebral arteries are widely patent through the neck. There is no significant ostial stenosis.  There is advanced facet arthropathy in the cervical spine. The alignment is anatomic however. There are no worrisome osseous lesions. No neck masses are present. Unremarkable thyroid.  Review of the MIP images confirms the above findings.  IMPRESSION: No intracranial or extracranial flow reducing lesion or occlusion is observed.  Non stenotic calcific atheromatous change without measurable stenosis at the internal carotid artery origins, and in the BILATERAL cavernous internal carotid arteries.  No acute intracranial findings are evident.   Electronically Signed   By: Rolla Flatten M.D.   On: 06/21/2014 15:22   Ct Head (brain) Wo Contrast  06/21/2014   CLINICAL DATA:  Mild atrophy with rather minimal periventricular small vessel disease. No demonstrable mass, hemorrhage, or acute appearing infarct.  EXAM: CT HEAD WITHOUT CONTRAST  TECHNIQUE: Contiguous axial images were obtained from the base of the skull through the vertex without intravenous contrast.  COMPARISON:  May 09, 2011  FINDINGS: There is no appreciable mass, hemorrhage, extra-axial fluid collection, or midline shift. There is slight small vessel disease in the centra semiovale bilaterally. There are occasional punctate calcifications in the left parietal region which could represent small granulomas or possibly be of vascular etiology. There is no new gray-white compartment lesion. There is no demonstrable acute infarct. The each middle cerebral artery appears symmetric with respect to density. Bony calvarium appears intact. The mastoid air cells are clear.  IMPRESSION: Mild atrophy with rather minimal periventricular small vessel disease. No intracranial hemorrhage, mass, or acute appearing infarct.  Critical Value/emergent results were called by telephone at the time of interpretation on 06/21/2014 at 2:27 pm to Dr. Doy Mince, neurology, who verbally  acknowledged these results.   Electronically Signed   By: Lowella Grip M.D.   On: 06/21/2014 14:27   Ct Angio Neck W/cm &/or Wo/cm  06/21/2014   CLINICAL DATA:  Syncopal episode.  Pacemaker.  EXAM: CT ANGIOGRAPHY HEAD AND  NECK  TECHNIQUE: Multidetector CT imaging of the head and neck was performed using the standard protocol during bolus administration of intravenous contrast. Multiplanar CT image reconstructions and MIPs were obtained to evaluate the vascular anatomy. Carotid stenosis measurements (when applicable) are obtained utilizing NASCET criteria, using the distal internal carotid diameter as the denominator.  CONTRAST:  13mL OMNIPAQUE IOHEXOL 350 MG/ML SOLN  COMPARISON:  CT head 06/21/2014 earlier in the day.  FINDINGS: CTA HEAD FINDINGS  The petrous, cavernous, and supraclinoid internal carotid arteries demonstrate no flow reducing lesion. Mild calcific atheromatous change is noted in the BILATERAL cavernous regions. ICA termini are widely patent.  Basilar artery widely patent with both vertebrals contributing to an equal degree.  There is no flow-limiting stenosis of the anterior, middle, or posterior cerebral arteries. There is no cerebellar branch occlusion is evident. There is no visible intracranial aneurysm. Post infusion, no abnormal enhancement is observed.  Review of the MIP images confirms the above findings.  CTA NECK FINDINGS  No lung apex lesion. Moderate atheromatous change of the transverse arch without dissection. Non stenotic atheromatous change of the great vessel origins with conventional branching. Pacemaker leads are observed from a LEFT subclavian approach.  The RIGHT carotid demonstrates no proximal atheromatous change. There is mild calcific disease at the bifurcation. There is no measurable stenosis. Moderate dolichoectasia is observed in the cervical ICA  The LEFT carotid system demonstrates no proximal lesion. There is mild non stenotic calcific plaque at the bifurcation.  No measurable stenosis. Moderate dolichoectasia is observed in the cervical ICA.  Both vertebral arteries are widely patent through the neck. There is no significant ostial stenosis.  There is advanced facet arthropathy in the cervical spine. The alignment is anatomic however. There are no worrisome osseous lesions. No neck masses are present. Unremarkable thyroid.  Review of the MIP images confirms the above findings.  IMPRESSION: No intracranial or extracranial flow reducing lesion or occlusion is observed.  Non stenotic calcific atheromatous change without measurable stenosis at the internal carotid artery origins, and in the BILATERAL cavernous internal carotid arteries.  No acute intracranial findings are evident.   Electronically Signed   By: Rolla Flatten M.D.   On: 06/21/2014 15:22    EKG: Independently reviewed. SR at 60 bpm- PR 308 msec-   Assessment/Plan Principal Problem:   Vertigo with significant amount of vomiting -Continue meclizine 3 times a day-if this is ineffective, I will write for low dose of when necessary Valium -Will give IV fluids and make her n.p.o. except for medications -Reassess in a.m.  Active Problems:   HYPERTENSION -Continue home meds-she has already taken her medications for today-if she is unable to tolerate oral meds tomorrow morning, these can be switched to IV form    Atrial fibrillation -Currently rate controlled -EKG is abnormal with a prolonged PR interval-will repeat EKG    Tachy-brady syndrome -That is post pacemaker in 2011     Consulted: None  Code Status: DO NOT INTUBATE Family Communication: With daughter DVT Prophylaxis: Pradaxa  Time spent: >45 min  [place], MD [facility]  If 7PM-7AM, please contact night-coverage [email or web address] 06/21/2014, 5:23 PM

## 2014-06-21 NOTE — ED Notes (Addendum)
Pt actively vomiting again. MD aware.

## 2014-06-22 ENCOUNTER — Encounter (HOSPITAL_COMMUNITY): Payer: Self-pay

## 2014-06-22 ENCOUNTER — Telehealth: Payer: Self-pay | Admitting: Cardiology

## 2014-06-22 ENCOUNTER — Encounter: Payer: Self-pay | Admitting: Internal Medicine

## 2014-06-22 DIAGNOSIS — I4891 Unspecified atrial fibrillation: Secondary | ICD-10-CM | POA: Diagnosis not present

## 2014-06-22 DIAGNOSIS — I10 Essential (primary) hypertension: Secondary | ICD-10-CM | POA: Diagnosis not present

## 2014-06-22 DIAGNOSIS — R42 Dizziness and giddiness: Secondary | ICD-10-CM | POA: Diagnosis not present

## 2014-06-22 DIAGNOSIS — I495 Sick sinus syndrome: Secondary | ICD-10-CM

## 2014-06-22 LAB — BASIC METABOLIC PANEL
ANION GAP: 12 (ref 5–15)
BUN: 19 mg/dL (ref 6–23)
CALCIUM: 9 mg/dL (ref 8.4–10.5)
CO2: 23 mEq/L (ref 19–32)
Chloride: 106 mEq/L (ref 96–112)
Creatinine, Ser: 1.44 mg/dL — ABNORMAL HIGH (ref 0.50–1.10)
GFR calc Af Amer: 37 mL/min — ABNORMAL LOW (ref >90)
GFR, EST NON AFRICAN AMERICAN: 32 mL/min — AB (ref >90)
GLUCOSE: 95 mg/dL (ref 70–99)
POTASSIUM: 4.8 meq/L (ref 3.7–5.3)
SODIUM: 141 meq/L (ref 137–147)

## 2014-06-22 MED ORDER — MECLIZINE HCL 25 MG PO TABS: 25.0000 mg | ORAL_TABLET | Freq: Three times a day (TID) | ORAL | Status: DC

## 2014-06-22 MED ORDER — STROKE: EARLY STAGES OF RECOVERY BOOK
Freq: Once | Status: AC
  Administered 2014-06-22: 07:00:00
  Filled 2014-06-22: qty 1

## 2014-06-22 NOTE — Telephone Encounter (Signed)
Spoke with pt and reminded pt of remote transmission that is due today. Pt verbalized understanding.   

## 2014-06-22 NOTE — Progress Notes (Signed)
Patient d/c home, D/C instruction given, patient verbalized understanding.Condition stable

## 2014-06-22 NOTE — Discharge Summary (Signed)
Physician Discharge Summary  Brooke Rios BMW:[id number] DOB: 09-21-1925 DOA: 06/21/2014  PCP: Unice Cobble, MD  Admit date: 06/21/2014 Discharge date: 06/22/2014  Time spent: 35 minutes  Recommendations for Outpatient Follow-up:  1. Follow up with PCP as an outpatient. 2. OT as an outpatient.  Discharge Diagnoses:  Principal Problem:   Vertigo Active Problems:   HYPERTENSION   Atrial fibrillation   Tachy-brady syndrome   Vomiting   Discharge Condition: stable  Diet recommendation: heart healthy  Filed Weights   06/21/14 1800 06/22/14 0100  Weight: 82.5 kg (181 lb 14.1 oz) 82.509 kg (181 lb 14.4 oz)    History of present illness:  80 year old with past medical history of sick sinus rhythm with A. Fib paced rhythm came to the ED complaining of sudden onset of vertigo with nausea and vomiting. Neurology was consulted recommended a CT head which was negative, CT neck negative.  Hospital Course:  Nausea and vomiting, dizziness 2 to vertigo: - No events on telemetry with paced rhythm rate controlled, symptoms by the next day were resolved with meclizine. CT angiography head and neck was negative. Neurology consulted they recommended no further workup. - no driving for the next 5 days.  Procedures:  CT angio of the head and neck show no reduced blood flow.  CT head negative for bleed  Consultations:  neurology  Discharge Exam: Filed Vitals:   06/22/14 0600  BP: 128/58  Pulse: 62  Temp: 97.9 F (36.6 C)  Resp: 18    General: awake alert and oriented x3 Cardiovascular: regular rate and rhythm Respiratory: good air movement clear to auscultation  Discharge Instructions You were cared for by a hospitalist during your hospital stay. If you have any questions about your discharge medications or the care you received while you were in the hospital after you are discharged, you can call the unit and asked to speak with the hospitalist on call if the hospitalist that  took care of you is not available. Once you are discharged, your primary care physician will handle any further medical issues. Please note that NO REFILLS for any discharge medications will be authorized once you are discharged, as it is imperative that you return to your primary care physician (or establish a relationship with a primary care physician if you do not have one) for your aftercare needs so that they can reassess your need for medications and monitor your lab values.  Discharge Instructions   Diet - low sodium heart healthy    Complete by:  As directed      Increase activity slowly    Complete by:  As directed             Medication List         benazepril 40 MG tablet  Commonly known as:  LOTENSIN  Take 0.5 tablets (20 mg total) by mouth every morning.     dabigatran 150 MG Caps capsule  Commonly known as:  PRADAXA  Take 150 mg by mouth daily.     isosorbide mononitrate 30 MG 24 hr tablet  Commonly known as:  IMDUR  Take 1 tablet (30 mg total) by mouth every morning.     meclizine 25 MG tablet  Commonly known as:  ANTIVERT  Take 1 tablet (25 mg total) by mouth 3 (three) times daily.     metoprolol succinate 50 MG 24 hr tablet  Commonly known as:  TOPROL-XL  Take 1 tablet (50 mg total) by mouth every morning. Take  with or immediately following a meal.     nitroGLYCERIN 0.4 MG SL tablet  Commonly known as:  NITROSTAT  Place 0.4 mg under the tongue every 5 (five) minutes as needed for chest pain.     polyethylene glycol packet  Commonly known as:  MIRALAX / GLYCOLAX  Take 17 g by mouth daily as needed (constipation).       Allergies  Allergen Reactions  . Cefpodoxime     Nocturia & disturbed sleep  . Codeine     nausea       Follow-up Information   Follow up with Unice Cobble, MD In 3 weeks. (hospital follow up)    Specialty:  Internal Medicine   Contact information:   [address] [address] [phone]        The results of  significant diagnostics from this hospitalization (including imaging, microbiology, ancillary and laboratory) are listed below for reference.    Significant Diagnostic Studies: Ct Angio Head W/cm &/or Wo Cm  06/21/2014   CLINICAL DATA:  Syncopal episode.  Pacemaker.  EXAM: CT ANGIOGRAPHY HEAD AND NECK  TECHNIQUE: Multidetector CT imaging of the head and neck was performed using the standard protocol during bolus administration of intravenous contrast. Multiplanar CT image reconstructions and MIPs were obtained to evaluate the vascular anatomy. Carotid stenosis measurements (when applicable) are obtained utilizing NASCET criteria, using the distal internal carotid diameter as the denominator.  CONTRAST:  53mL OMNIPAQUE IOHEXOL 350 MG/ML SOLN  COMPARISON:  CT head 06/21/2014 earlier in the day.  FINDINGS: CTA HEAD FINDINGS  The petrous, cavernous, and supraclinoid internal carotid arteries demonstrate no flow reducing lesion. Mild calcific atheromatous change is noted in the BILATERAL cavernous regions. ICA termini are widely patent.  Basilar artery widely patent with both vertebrals contributing to an equal degree.  There is no flow-limiting stenosis of the anterior, middle, or posterior cerebral arteries. There is no cerebellar branch occlusion is evident. There is no visible intracranial aneurysm. Post infusion, no abnormal enhancement is observed.  Review of the MIP images confirms the above findings.  CTA NECK FINDINGS  No lung apex lesion. Moderate atheromatous change of the transverse arch without dissection. Non stenotic atheromatous change of the great vessel origins with conventional branching. Pacemaker leads are observed from a LEFT subclavian approach.  The RIGHT carotid demonstrates no proximal atheromatous change. There is mild calcific disease at the bifurcation. There is no measurable stenosis. Moderate dolichoectasia is observed in the cervical ICA  The LEFT carotid system demonstrates no proximal  lesion. There is mild non stenotic calcific plaque at the bifurcation. No measurable stenosis. Moderate dolichoectasia is observed in the cervical ICA.  Both vertebral arteries are widely patent through the neck. There is no significant ostial stenosis.  There is advanced facet arthropathy in the cervical spine. The alignment is anatomic however. There are no worrisome osseous lesions. No neck masses are present. Unremarkable thyroid.  Review of the MIP images confirms the above findings.  IMPRESSION: No intracranial or extracranial flow reducing lesion or occlusion is observed.  Non stenotic calcific atheromatous change without measurable stenosis at the internal carotid artery origins, and in the BILATERAL cavernous internal carotid arteries.  No acute intracranial findings are evident.   Electronically Signed   By: Rolla Flatten M.D.   On: 06/21/2014 15:22   Ct Head (brain) Wo Contrast  06/21/2014   CLINICAL DATA:  Mild atrophy with rather minimal periventricular small vessel disease. No demonstrable mass, hemorrhage, or acute  appearing infarct.  EXAM: CT HEAD WITHOUT CONTRAST  TECHNIQUE: Contiguous axial images were obtained from the base of the skull through the vertex without intravenous contrast.  COMPARISON:  May 09, 2011  FINDINGS: There is no appreciable mass, hemorrhage, extra-axial fluid collection, or midline shift. There is slight small vessel disease in the centra semiovale bilaterally. There are occasional punctate calcifications in the left parietal region which could represent small granulomas or possibly be of vascular etiology. There is no new gray-white compartment lesion. There is no demonstrable acute infarct. The each middle cerebral artery appears symmetric with respect to density. Bony calvarium appears intact. The mastoid air cells are clear.  IMPRESSION: Mild atrophy with rather minimal periventricular small vessel disease. No intracranial hemorrhage, mass, or acute appearing infarct.   Critical Value/emergent results were called by telephone at the time of interpretation on 06/21/2014 at 2:27 pm to Dr. Doy Mince, neurology, who verbally acknowledged these results.   Electronically Signed   By: Lowella Grip M.D.   On: 06/21/2014 14:27   Ct Angio Neck W/cm &/or Wo/cm  06/21/2014   CLINICAL DATA:  Syncopal episode.  Pacemaker.  EXAM: CT ANGIOGRAPHY HEAD AND NECK  TECHNIQUE: Multidetector CT imaging of the head and neck was performed using the standard protocol during bolus administration of intravenous contrast. Multiplanar CT image reconstructions and MIPs were obtained to evaluate the vascular anatomy. Carotid stenosis measurements (when applicable) are obtained utilizing NASCET criteria, using the distal internal carotid diameter as the denominator.  CONTRAST:  8mL OMNIPAQUE IOHEXOL 350 MG/ML SOLN  COMPARISON:  CT head 06/21/2014 earlier in the day.  FINDINGS: CTA HEAD FINDINGS  The petrous, cavernous, and supraclinoid internal carotid arteries demonstrate no flow reducing lesion. Mild calcific atheromatous change is noted in the BILATERAL cavernous regions. ICA termini are widely patent.  Basilar artery widely patent with both vertebrals contributing to an equal degree.  There is no flow-limiting stenosis of the anterior, middle, or posterior cerebral arteries. There is no cerebellar branch occlusion is evident. There is no visible intracranial aneurysm. Post infusion, no abnormal enhancement is observed.  Review of the MIP images confirms the above findings.  CTA NECK FINDINGS  No lung apex lesion. Moderate atheromatous change of the transverse arch without dissection. Non stenotic atheromatous change of the great vessel origins with conventional branching. Pacemaker leads are observed from a LEFT subclavian approach.  The RIGHT carotid demonstrates no proximal atheromatous change. There is mild calcific disease at the bifurcation. There is no measurable stenosis. Moderate dolichoectasia is  observed in the cervical ICA  The LEFT carotid system demonstrates no proximal lesion. There is mild non stenotic calcific plaque at the bifurcation. No measurable stenosis. Moderate dolichoectasia is observed in the cervical ICA.  Both vertebral arteries are widely patent through the neck. There is no significant ostial stenosis.  There is advanced facet arthropathy in the cervical spine. The alignment is anatomic however. There are no worrisome osseous lesions. No neck masses are present. Unremarkable thyroid.  Review of the MIP images confirms the above findings.  IMPRESSION: No intracranial or extracranial flow reducing lesion or occlusion is observed.  Non stenotic calcific atheromatous change without measurable stenosis at the internal carotid artery origins, and in the BILATERAL cavernous internal carotid arteries.  No acute intracranial findings are evident.   Electronically Signed   By: Rolla Flatten M.D.   On: 06/21/2014 15:22    Microbiology: No results found for this or any previous visit (from the past 240 hour(s)).  Labs: Basic Metabolic Panel:  Recent Labs Lab 06/21/14 1407 06/21/14 1417 06/22/14 0610  NA 142 140 141  K 3.5* 3.4* 4.8  CL 102 104 106  CO2 22  --  23  GLUCOSE 195* 206* 95  BUN 22 21 19   CREATININE 1.47* 1.50* 1.44*  CALCIUM 9.4  --  9.0   Liver Function Tests:  Recent Labs Lab 06/21/14 1407  AST 17  ALT 15  ALKPHOS 97  BILITOT 0.3  PROT 6.6  ALBUMIN 3.9   No results found for this basename: LIPASE, AMYLASE,  in the last 168 hours No results found for this basename: AMMONIA,  in the last 168 hours CBC:  Recent Labs Lab 06/21/14 1407 06/21/14 1417  WBC 5.9  --   NEUTROABS 3.9  --   HGB 12.1 13.3  HCT 37.4 39.0  MCV 84.2  --   PLT 173  --    Cardiac Enzymes: No results found for this basename: CKTOTAL, CKMB, CKMBINDEX, TROPONINI,  in the last 168 hours BNP: BNP (last 3 results) No results found for this basename: PROBNP,  in the last  8760 hours CBG:  Recent Labs Lab 06/21/14 1414  GLUCAP 175*      Signed:  FELIZ ORTIZ, ABRAHAM  [facility] 06/22/2014, 7:45 AM

## 2014-06-22 NOTE — Progress Notes (Signed)
Remote pacemaker transmission.   

## 2014-06-23 LAB — MDC_IDC_ENUM_SESS_TYPE_REMOTE
Battery Impedance: 203 Ohm
Battery Remaining Longevity: 115 mo
Brady Statistic AP VP Percent: 5 %
Brady Statistic AS VP Percent: 0 %
Brady Statistic AS VS Percent: 2 %
Date Time Interrogation Session: 20150804162359
Lead Channel Impedance Value: 387 Ohm
Lead Channel Impedance Value: 463 Ohm
Lead Channel Pacing Threshold Amplitude: 0.5 V
Lead Channel Pacing Threshold Amplitude: 0.5 V
Lead Channel Pacing Threshold Pulse Width: 0.4 ms
Lead Channel Sensing Intrinsic Amplitude: 5.6 mV
Lead Channel Setting Sensing Sensitivity: 2.8 mV
MDC IDC MSMT BATTERY VOLTAGE: 2.79 V
MDC IDC MSMT LEADCHNL RV PACING THRESHOLD PULSEWIDTH: 0.4 ms
MDC IDC SET LEADCHNL RA PACING AMPLITUDE: 2 V
MDC IDC SET LEADCHNL RV PACING AMPLITUDE: 2.5 V
MDC IDC SET LEADCHNL RV PACING PULSEWIDTH: 0.4 ms
MDC IDC STAT BRADY AP VS PERCENT: 93 %

## 2014-06-25 ENCOUNTER — Encounter: Payer: Self-pay | Admitting: Cardiology

## 2014-07-19 ENCOUNTER — Telehealth: Payer: Self-pay

## 2014-07-19 MED ORDER — ISOSORBIDE MONONITRATE ER 30 MG PO TB24: 30.0000 mg | ORAL_TABLET | Freq: Every morning | ORAL | Status: DC

## 2014-07-19 MED ORDER — METOPROLOL SUCCINATE ER 50 MG PO TB24: 50.0000 mg | ORAL_TABLET | Freq: Every morning | ORAL | Status: DC

## 2014-07-19 NOTE — Telephone Encounter (Signed)
Patient walked in office.She stated she was near office and thought she would stop by to reschedule appointment with Dr.Jordan.Appointment scheduled with Dr.Jordan 09/14/14 at 10:15 am.Stated she needed metoprolol and isosorbide refilled.Refills sent to pharmacy.

## 2014-07-20 ENCOUNTER — Ambulatory Visit: Payer: Medicare Other | Admitting: Cardiology

## 2014-09-01 ENCOUNTER — Encounter: Payer: Self-pay | Admitting: Internal Medicine

## 2014-09-01 ENCOUNTER — Ambulatory Visit (INDEPENDENT_AMBULATORY_CARE_PROVIDER_SITE_OTHER): Payer: Medicare Other | Admitting: Internal Medicine

## 2014-09-01 ENCOUNTER — Other Ambulatory Visit (INDEPENDENT_AMBULATORY_CARE_PROVIDER_SITE_OTHER): Payer: Medicare Other

## 2014-09-01 VITALS — BP 130/88 | HR 85 | Temp 97.5°F | Resp 12 | Wt 177.4 lb

## 2014-09-01 DIAGNOSIS — I10 Essential (primary) hypertension: Secondary | ICD-10-CM

## 2014-09-01 DIAGNOSIS — M10072 Idiopathic gout, left ankle and foot: Secondary | ICD-10-CM

## 2014-09-01 DIAGNOSIS — R739 Hyperglycemia, unspecified: Secondary | ICD-10-CM

## 2014-09-01 DIAGNOSIS — D696 Thrombocytopenia, unspecified: Secondary | ICD-10-CM

## 2014-09-01 DIAGNOSIS — E785 Hyperlipidemia, unspecified: Secondary | ICD-10-CM

## 2014-09-01 DIAGNOSIS — I2510 Atherosclerotic heart disease of native coronary artery without angina pectoris: Secondary | ICD-10-CM

## 2014-09-01 LAB — LIPID PANEL
Cholesterol: 221 mg/dL — ABNORMAL HIGH (ref 0–200)
HDL: 28.6 mg/dL — ABNORMAL LOW
NonHDL: 192.4
Total CHOL/HDL Ratio: 8
Triglycerides: 257 mg/dL — ABNORMAL HIGH (ref 0.0–149.0)
VLDL: 51.4 mg/dL — ABNORMAL HIGH (ref 0.0–40.0)

## 2014-09-01 LAB — HEPATIC FUNCTION PANEL
ALT: 21 U/L (ref 0–35)
AST: 25 U/L (ref 0–37)
Albumin: 3.7 g/dL (ref 3.5–5.2)
Alkaline Phosphatase: 92 U/L (ref 39–117)
Bilirubin, Direct: 0.1 mg/dL (ref 0.0–0.3)
Total Bilirubin: 0.6 mg/dL (ref 0.2–1.2)
Total Protein: 7.4 g/dL (ref 6.0–8.3)

## 2014-09-01 LAB — CBC WITH DIFFERENTIAL/PLATELET
Basophils Absolute: 0 10*3/uL (ref 0.0–0.1)
Basophils Relative: 0.4 % (ref 0.0–3.0)
EOS PCT: 1.7 % (ref 0.0–5.0)
Eosinophils Absolute: 0.1 10*3/uL (ref 0.0–0.7)
HCT: 39.4 % (ref 36.0–46.0)
HEMOGLOBIN: 13.1 g/dL (ref 12.0–15.0)
Lymphocytes Relative: 22.9 % (ref 12.0–46.0)
Lymphs Abs: 1.3 10*3/uL (ref 0.7–4.0)
MCHC: 33.1 g/dL (ref 30.0–36.0)
MCV: 84 fl (ref 78.0–100.0)
MONOS PCT: 6.4 % (ref 3.0–12.0)
Monocytes Absolute: 0.4 10*3/uL (ref 0.1–1.0)
Neutro Abs: 3.8 10*3/uL (ref 1.4–7.7)
Neutrophils Relative %: 68.6 % (ref 43.0–77.0)
PLATELETS: 230 10*3/uL (ref 150.0–400.0)
RBC: 4.69 Mil/uL (ref 3.87–5.11)
RDW: 14.7 % (ref 11.5–15.5)
WBC: 5.6 10*3/uL (ref 4.0–10.5)

## 2014-09-01 LAB — BASIC METABOLIC PANEL
BUN: 27 mg/dL — AB (ref 6–23)
CO2: 28 mEq/L (ref 19–32)
Calcium: 9.9 mg/dL (ref 8.4–10.5)
Chloride: 102 mEq/L (ref 96–112)
Creatinine, Ser: 1.6 mg/dL — ABNORMAL HIGH (ref 0.4–1.2)
GFR: 32.19 mL/min — AB (ref >60.00)
Glucose, Bld: 107 mg/dL — ABNORMAL HIGH (ref 70–99)
POTASSIUM: 4.2 meq/L (ref 3.5–5.1)
Sodium: 139 mEq/L (ref 135–145)

## 2014-09-01 LAB — TSH: TSH: 5.68 u[IU]/mL — ABNORMAL HIGH (ref 0.35–4.50)

## 2014-09-01 LAB — LDL CHOLESTEROL, DIRECT: Direct LDL: 123.5 mg/dL

## 2014-09-01 LAB — URIC ACID: Uric Acid, Serum: 9.3 mg/dL — ABNORMAL HIGH (ref 2.4–7.0)

## 2014-09-01 LAB — HEMOGLOBIN A1C: Hgb A1c MFr Bld: 6 % (ref 4.6–6.5)

## 2014-09-01 NOTE — Assessment & Plan Note (Signed)
Uric acid

## 2014-09-01 NOTE — Assessment & Plan Note (Signed)
Blood pressure goals reviewed. BMET 

## 2014-09-01 NOTE — Patient Instructions (Signed)
Minimal Blood Pressure Goal= AVERAGE < 140/90;  Ideal is an AVERAGE < 135/85. This AVERAGE should be calculated from @ least 5-7 BP readings taken @ different times of day on different days of week. You should not respond to isolated BP readings , but rather the AVERAGE for that week .Please bring your  blood pressure cuff to office visits to verify that it is reliable.It  can also be checked against the blood pressure device at the pharmacy. Finger or wrist cuffs are not dependable; an arm cuff is.   Your next office appointment will be determined based upon review of your pending labs . Those instructions will be transmitted to you  by mail.

## 2014-09-01 NOTE — Progress Notes (Signed)
   Subjective:    Patient ID: Brooke Rios, female    DOB: [birth year], 80 y.o.   MRN: [id number]  HPI She is here to assess status of active health conditions:  Diet/ nutrition: Limits fried foods. Eats honey nut cheerios and fruit for breakfast, Chicken and vegetables at lunch, and a sandwich with thin rye bread and [place] for dinner. 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  HYPERTENSION: Disease Monitoring: Infrequently monitors blood pressure Blood pressure average: 150/85 Medication Compliance: Taking meds as prescribed  FASTING HYPERGLYCEMIA :  FBS range/average:no monitor Medication compliance:on ACE-I Hypoglycemia:no Ophthamology care: UTD Podiatry care:not UTD  HYPERLIPIDEMIA: Disease Monitoring: Medication Compliance: No statin  She was seen in the emergency room 06/21/14 for dizziness associated with vomiting x3. At that time her creatinine was 1.44.  She was also seen in urgent care for acute gout in the left foot.     Review of Systems   Chest pain, palpitations: No     Dyspnea: No Edema: No Claudication:  Lightheadedness,Syncope: No (see above re: dizziness) Weight gain/loss: No Polyuria/phagia/dipsia: No      Blurred vision /diplopia/lossof vision: No Limb numbness/tingling/burning: No Non healing skin lesions: No Abd pain, bowel changes: No  Myalgias: No Memory loss: No        Objective:   Physical Exam   Positive or pertinent findings include: Bilateral ptosis. Heart sounds are somewhat distant; grade 1 systolic murmur is present Abdomen slightly protuberant. There is some dullness in the right quadrant She has crepitus of knees ,greater left than right Pedal pulses are slightly decreased but equal.  General appearance :adequately nourished; in no distress. Eyes: No conjunctival inflammation or scleral icterus is present. Oral exam:  Dental hygiene is good. Lips and gums are healthy appearing.There is no oropharyngeal erythema or exudate noted.  Heart:  Normal rate and regular rhythm. S1 and S2 normal without gallop, click, rub or other extra sounds   Lungs:Chest clear to auscultation; no wheezes, rhonchi,rales ,or rubs present.No increased work of breathing.  Abdomen: bowel sounds normal, soft and non-tender without masses, organomegaly or hernias noted.  No guarding or rebound.  Vascular : all pulses equal ; no bruits present. Skin:Warm & dry.  Intact without suspicious lesions or rashes ; no jaundice or tenting Lymphatic: No lymphadenopathy is noted about the head, neck, axilla           Assessment & Plan:  See Current Assessment & Plan in Problem List under specific Diagnosis

## 2014-09-01 NOTE — Assessment & Plan Note (Signed)
Lipids, LFTs, TSH  

## 2014-09-01 NOTE — Progress Notes (Signed)
Pre visit review using our clinic review tool, if applicable. No additional management support is needed unless otherwise documented below in the visit note. 

## 2014-09-01 NOTE — Assessment & Plan Note (Deleted)
Blood pressure goals reviewed. BMET 

## 2014-09-01 NOTE — Assessment & Plan Note (Signed)
A1c

## 2014-09-04 ENCOUNTER — Other Ambulatory Visit: Payer: Self-pay | Admitting: Internal Medicine

## 2014-09-04 DIAGNOSIS — E038 Other specified hypothyroidism: Secondary | ICD-10-CM

## 2014-09-04 MED ORDER — LEVOTHYROXINE SODIUM 25 MCG PO TABS: 25.0000 ug | ORAL_TABLET | Freq: Every day | ORAL | Status: DC

## 2014-09-09 ENCOUNTER — Ambulatory Visit (INDEPENDENT_AMBULATORY_CARE_PROVIDER_SITE_OTHER): Payer: Medicare Other | Admitting: Emergency Medicine

## 2014-09-09 VITALS — BP 124/84 | HR 76 | Temp 97.9°F | Resp 16 | Ht 66.0 in | Wt 182.2 lb

## 2014-09-09 DIAGNOSIS — I2510 Atherosclerotic heart disease of native coronary artery without angina pectoris: Secondary | ICD-10-CM | POA: Diagnosis not present

## 2014-09-09 DIAGNOSIS — L292 Pruritus vulvae: Secondary | ICD-10-CM | POA: Diagnosis not present

## 2014-09-09 MED ORDER — TERCONAZOLE 0.8 % VA CREA: TOPICAL_CREAM | VAGINAL | Status: DC

## 2014-09-09 NOTE — Progress Notes (Signed)
Subjective:    Patient ID: Brooke Rios, female    DOB: 1925-01-05, 80 y.o.   MRN: [id number] Patient Active Problem List   Diagnosis Date Noted  . Vertigo 06/21/2014  . Vomiting 06/21/2014  . History of anemia 09/08/2013  . Gout 05/22/2013  . Pacemaker-[employer] 09/10/2012  . Unspecified adverse effect of unspecified drug, medicinal and biological substance 06/12/2012  . Hyperglycemia 06/12/2012  . Tachy-brady syndrome 02/05/2012  . Atrial fibrillation 09/11/2011  . Coronary artery disease   . HIP PAIN 10/17/2010  . DIZZINESS 11/22/2009  . RENAL DISEASE, CHRONIC, MILD 06/08/2009  . BENIGN PAROXYSMAL POSITIONAL VERTIGO 12/30/2008  . BRADYCARDIA, CHRONIC 12/30/2008  . DIVERTICULOSIS, COLON 12/30/2008  . OSTEOARTHRITIS 12/30/2008  . COLONIC POLYPS, HX OF 12/30/2008  . Hyperlipidemia 12/24/2007  . Thrombocytopenia 12/24/2007  . Essential hypertension 12/24/2007  . FIBROCYSTIC BREAST DISEASE 12/24/2007   Prior to Admission medications   Medication Sig Start Date End Date Taking? Authorizing Provider  benazepril (LOTENSIN) 40 MG tablet Take 0.5 tablets (20 mg total) by mouth every morning. 12/07/13  Yes Peter M Martinique, MD  dabigatran (PRADAXA) 150 MG CAPS capsule Take 150 mg by mouth daily.   Yes Historical Provider, MD  levothyroxine (SYNTHROID, LEVOTHROID) 25 MCG tablet Take 1 tablet (25 mcg total) by mouth daily before breakfast. 09/04/14  Yes Hendricks Limes, MD  meclizine (ANTIVERT) 25 MG tablet Take 1 tablet (25 mg total) by mouth 3 (three) times daily. 06/22/14  Yes Charlynne Cousins, MD  metoprolol succinate (TOPROL-XL) 50 MG 24 hr tablet Take 1 tablet (50 mg total) by mouth every morning. Take with or immediately following a meal. 07/19/14  Yes Peter M Martinique, MD  nitroGLYCERIN (NITROSTAT) 0.4 MG SL tablet Place 0.4 mg under the tongue every 5 (five) minutes as needed for chest pain. 08/26/12  Yes Peter M Martinique, MD  polyethylene glycol [facility] / Floria Raveling) packet Take 17 g  by mouth daily as needed (constipation).   Yes Historical Provider, MD  isosorbide mononitrate (IMDUR) 30 MG 24 hr tablet Take 1 tablet (30 mg total) by mouth every morning. 07/19/14   Peter M Martinique, MD  terconazole (TERAZOL 3) 0.8 % vaginal cream Apply small amount of cream to affected area twice a day for 7 days 09/09/14 09/16/14  Ezekiel Slocumb, PA-C   HPI  This is an 80 year old female presenting with a pruritic spot on her vulva x 4 months. The area that is bothering her is red. She reports the pruritus is worsening. She tried vagisil once but it burned so she stopped. Otherwise, the only thing that makes it better is soaking in a bathtub and rubbing the area with a washcloth. She has never had anything like this before. She does admit problems with urinary incontinence and sometimes she sits for longer than she should in wet undergarments. She denies dysuria, vaginal discharge or bleeding. She is not sexually active. She has had a complete hysterectomy.  Review of Systems  Constitutional: Negative for fever and chills.  Gastrointestinal: Negative for nausea, vomiting, abdominal pain and diarrhea.  Genitourinary: Negative for dysuria, frequency, hematuria, vaginal bleeding, vaginal discharge and difficulty urinating.  Skin: Positive for rash.      Objective:   Physical Exam  Constitutional: She is oriented to person, place, and time. She appears well-developed and well-nourished. No distress.  HENT:  Head: Normocephalic and atraumatic.  Right Ear: Hearing normal.  Left Ear: Hearing normal.  Eyes: Conjunctivae and lids are normal.  Right eye exhibits no discharge. Left eye exhibits no discharge. No scleral icterus.  Pulmonary/Chest: Effort normal. No respiratory distress.  Abdominal: Soft. Normal appearance. There is no tenderness.  Genitourinary:    There is rash and lesion on the right labia. There is rash and lesion on the left labia.  There are erythematous kissing lesions,  nonvesicular, on the superior bilateral labia majora. At the most superior part where the labia majora come together there is a 1 cm fissure.  Neurological: She is alert and oriented to person, place, and time.  Skin: Skin is warm, dry and intact. Lesion and rash noted. Rash is not pustular and not vesicular. There is erythema.  Psychiatric: She has a normal mood and affect. Her speech is normal and behavior is normal. Thought content normal.      Assessment & Plan:  1. Vulvar pruritus  Patient likely has a yeast skin infection, as evidenced by erythematous kissing lesions. The skin infection has likely been worsened by constant scratching. She will use terazol cream BID x 7 days. She will apply a small amount of hydrocortisone at night and avoid scratching the area. We discussed the need to change out of wet undergarments as soon as possible. She will return in 10 days if no improvement.  - terconazole (TERAZOL 3) 0.8 % vaginal cream; Apply small amount of cream to affected area twice a day for 7 days  Dispense: 20 g; Refill: 0   Benjaman Pott. Drenda Freeze, MHS [facility]  09/10/2014

## 2014-09-09 NOTE — Patient Instructions (Signed)
Apply small amount of yeast cream to affected area twice a day for 7 days. Buy some hydrocortisone cream and apply a small amount at night for itching

## 2014-09-14 ENCOUNTER — Ambulatory Visit (INDEPENDENT_AMBULATORY_CARE_PROVIDER_SITE_OTHER): Payer: Medicare Other | Admitting: Cardiology

## 2014-09-14 ENCOUNTER — Encounter: Payer: Self-pay | Admitting: Cardiology

## 2014-09-14 VITALS — BP 112/62 | HR 68 | Ht 66.0 in | Wt 178.2 lb

## 2014-09-14 DIAGNOSIS — I480 Paroxysmal atrial fibrillation: Secondary | ICD-10-CM | POA: Diagnosis not present

## 2014-09-14 DIAGNOSIS — I2510 Atherosclerotic heart disease of native coronary artery without angina pectoris: Secondary | ICD-10-CM | POA: Diagnosis not present

## 2014-09-14 DIAGNOSIS — I495 Sick sinus syndrome: Secondary | ICD-10-CM

## 2014-09-14 DIAGNOSIS — I10 Essential (primary) hypertension: Secondary | ICD-10-CM

## 2014-09-14 NOTE — Patient Instructions (Signed)
Continue your current cardiac meds.  I will see you in 6 months

## 2014-09-14 NOTE — Progress Notes (Signed)
[name] Date of Birth: Sep 18, 1925 Medical Record #[id number]  History of Present Illness: Brooke Rios is seen today for a followup visit. She has SSS with pacemaker in place. Has had PAF, HTN, HLD and moderate 3 vessel CAD per cath back in 2011. Since her last visit here she was admitted in August with severe vertigo. CT was negative. She responded to Meclizine. She has also been treated for UTI and gout in her toe. She was recently place on thyroid replacement but she complained of worsening dizziness with the medication and stopped it after day 3. He dizziness resolved. She denies any chest pain or palpitations.  Current Outpatient Prescriptions on File Prior to Visit  Medication Sig Dispense Refill  . benazepril (LOTENSIN) 40 MG tablet Take 0.5 tablets (20 mg total) by mouth every morning.  30 tablet  6  . dabigatran (PRADAXA) 150 MG CAPS capsule Take 150 mg by mouth daily.      . isosorbide mononitrate (IMDUR) 30 MG 24 hr tablet Take 1 tablet (30 mg total) by mouth every morning.  90 tablet  3  . levothyroxine (SYNTHROID, LEVOTHROID) 25 MCG tablet Take 1 tablet (25 mcg total) by mouth daily before breakfast.  30 tablet  2  . meclizine (ANTIVERT) 25 MG tablet Take 1 tablet (25 mg total) by mouth 3 (three) times daily.  30 tablet  0  . metoprolol succinate (TOPROL-XL) 50 MG 24 hr tablet Take 1 tablet (50 mg total) by mouth every morning. Take with or immediately following a meal.  90 tablet  3  . nitroGLYCERIN (NITROSTAT) 0.4 MG SL tablet Place 0.4 mg under the tongue every 5 (five) minutes as needed for chest pain.      . polyethylene glycol (MIRALAX / GLYCOLAX) packet Take 17 g by mouth daily as needed (constipation).       No current facility-administered medications on file prior to visit.    Allergies  Allergen Reactions  . Cefpodoxime     Nocturia & disturbed sleep  . Codeine     nausea    Past Medical History  Diagnosis Date  . Hypertension   . Diverticulosis of colon    . Osteoarthritis   . Colonic polyp   . Sick sinus syndrome     with paroxysmal atrial fibrillation  . Hyperlipidemia   . Dyslipidemia   . Coronary artery disease     Moderate three vessel obstructive coronary disease  . Vertigo   . Chronic kidney disease 2011    creat 1.5   . E. coli UTI 02/2013    uniformly sensitive  . Gout     Past Surgical History  Procedure Laterality Date  . Cholecystectomy      age 60  . Abdominal hysterectomy    . Oophorectomy      bilat for fibroids @ age 84, anemia pre op due to dysfunctional menses yo  . Tonsillectomy    . Breast biopsy      x 1  . Appendectomy      done at [facility]  . Breast lumpectomy  2009    Dr Margot Chimes  . Paf induced mi, s/p pacer for brady-tach syndrome  04/2010    Dr.Michella Detjen  . Other surgical history  hysterectomy  . Cholecystectomy    . Breast biopsy    . Pacemaker insertion  2011    History  Smoking status  . Never Smoker   Smokeless tobacco  . Not on file    History  Alcohol Use No    Family History  Problem Relation Age of Onset  . Hyperlipidemia Father   . Stroke Father     TIA,CVA  . Heart failure Father   . Cancer Mother     ? renal primary  . Breast cancer Sister     11/08  . Breast cancer Maternal Aunt   . Colon cancer Maternal Aunt   . Breast cancer Daughter     lumpectomy 2007;mastectomy 2013  . Heart disease Brother     pacer  . Diabetes Neg Hx     Review of Systems: The review of systems is per the HPI.  All other systems were reviewed and are negative.  Physical Exam: BP 112/62  Pulse 68  Ht 5\' 6"  (1.676 m)  Wt 178 lb 3.2 oz (80.831 kg)  BMI 28.78 kg/m2 Patient is very pleasant and in no acute distress. Skin is warm and dry. Color is normal.  HEENT is unremarkable. Normocephalic/atraumatic. PERRL. Sclera are nonicteric. Neck is supple. No masses. No JVD. Lungs are clear. Cardiac exam shows a regular rate and rhythm. Abdomen is soft. Extremities are without edema. Gait and ROM are  intact. No gross neurologic deficits noted.  LABORATORY DATA:  Lab Results  Component Value Date   WBC 5.6 09/01/2014   HGB 13.1 09/01/2014   HCT 39.4 09/01/2014   PLT 230.0 09/01/2014   GLUCOSE 107* 09/01/2014   CHOL 221* 09/01/2014   TRIG 257.0* 09/01/2014   HDL 28.60* 09/01/2014   LDLDIRECT 123.5 09/01/2014   LDLCALC 122* 09/08/2013   ALT 21 09/01/2014   AST 25 09/01/2014   NA 139 09/01/2014   K 4.2 09/01/2014   CL 102 09/01/2014   CREATININE 1.6* 09/01/2014   BUN 27* 09/01/2014   CO2 28 09/01/2014   TSH 5.68* 09/01/2014   INR 1.31 06/21/2014   HGBA1C 6.0 09/01/2014   MICROALBUR 0.2 07/01/2007    Assessment / Plan:  1. HTN - well  controlled. Continue current therapy.   2. CAD - moderate 3VD per cath back in 2011 - no chest pain. Follow up in 6 months.  3. HLD - on fish oil only. Reports intolerance to statins- just made her feel bad. Stressed the importance of dietary compliance and regular aerobic exrercise.  4. SSS/PAF - with pacemaker in place - followed by Dr. Lovena Le - managed with rate control and on chronic Pradaxa. Pacemaker check in August was satisfactory.   5. Vertigo- improved.

## 2014-10-01 ENCOUNTER — Encounter: Payer: Self-pay | Admitting: Internal Medicine

## 2014-10-01 ENCOUNTER — Ambulatory Visit (INDEPENDENT_AMBULATORY_CARE_PROVIDER_SITE_OTHER): Payer: Medicare Other | Admitting: Internal Medicine

## 2014-10-01 VITALS — BP 110/80 | HR 94 | Ht 66.0 in | Wt 180.8 lb

## 2014-10-01 DIAGNOSIS — Z950 Presence of cardiac pacemaker: Secondary | ICD-10-CM | POA: Diagnosis not present

## 2014-10-01 DIAGNOSIS — I2510 Atherosclerotic heart disease of native coronary artery without angina pectoris: Secondary | ICD-10-CM | POA: Diagnosis not present

## 2014-10-01 DIAGNOSIS — I482 Chronic atrial fibrillation, unspecified: Secondary | ICD-10-CM

## 2014-10-01 LAB — MDC_IDC_ENUM_SESS_TYPE_INCLINIC
Battery Impedance: 203 Ohm
Battery Remaining Longevity: 116 mo
Battery Voltage: 2.79 V
Brady Statistic AP VP Percent: 5 %
Brady Statistic AP VS Percent: 93 %
Brady Statistic AS VP Percent: 0 %
Brady Statistic AS VS Percent: 2 %
Lead Channel Impedance Value: 407 Ohm
Lead Channel Impedance Value: 466 Ohm
Lead Channel Setting Pacing Amplitude: 2 V
Lead Channel Setting Pacing Amplitude: 2.5 V
Lead Channel Setting Pacing Pulse Width: 0.4 ms
Lead Channel Setting Sensing Sensitivity: 2.8 mV
MDC IDC MSMT LEADCHNL RA SENSING INTR AMPL: 1.4 mV
MDC IDC MSMT LEADCHNL RV PACING THRESHOLD AMPLITUDE: 0.75 V
MDC IDC MSMT LEADCHNL RV PACING THRESHOLD PULSEWIDTH: 0.4 ms
MDC IDC MSMT LEADCHNL RV SENSING INTR AMPL: 8 mV
MDC IDC SESS DTM: 20151113173507

## 2014-10-01 NOTE — Assessment & Plan Note (Signed)
Her Medtronic DDD PM is working normally. Will recheck in several months. 

## 2014-10-01 NOTE — Assessment & Plan Note (Signed)
Her ventricular rate is reasonably well controlled. Will follow.

## 2014-10-01 NOTE — Assessment & Plan Note (Signed)
She denies anginal symptoms. She will continue her current

## 2014-10-01 NOTE — Patient Instructions (Signed)
Your physician wants you to follow-up in:  12 months with Dr Knox Saliva will receive a reminder letter in the mail two months in advance. If you don't receive a letter, please call our office to schedule the follow-up appointment.   Remote monitoring is used to monitor your Pacemaker of ICD from home. This monitoring reduces the number of office visits required to check your device to one time per year. It allows [place] to keep an eye on the functioning of your device to ensure it is working properly. You are scheduled for a device check from home on 01/03/15. You may send your transmission at any time that day. If you have a wireless device, the transmission will be sent automatically. After your physician reviews your transmission, you will receive a postcard with your next transmission date.

## 2014-10-01 NOTE — Progress Notes (Signed)
HPI Brooke Rios returns today for followup. She is a very pleasant 80 year old woman, with a history of symptomatic bradycardia,  hypertension, status post permanent pacemaker insertion. In the interim, the patient has been stable. She admits to occasional dietary indiscretion. She denies chest pain, shortness of breath, or peripheral edema. No syncope.  No palpitations. She notes a single episode of vertigo back in August which has resolved. Allergies  Allergen Reactions  . Cefpodoxime     Nocturia & disturbed sleep  . Codeine     nausea     Current Outpatient Prescriptions  Medication Sig Dispense Refill  . benazepril (LOTENSIN) 40 MG tablet Take 0.5 tablets (20 mg total) by mouth every morning. 30 tablet 6  . dabigatran (PRADAXA) 150 MG CAPS capsule Take 150 mg by mouth daily.    . isosorbide mononitrate (IMDUR) 30 MG 24 hr tablet Take 1 tablet (30 mg total) by mouth every morning. 90 tablet 3  . levothyroxine (SYNTHROID, LEVOTHROID) 25 MCG tablet Take 1 tablet (25 mcg total) by mouth daily before breakfast. 30 tablet 2  . meclizine (ANTIVERT) 25 MG tablet Take 1 tablet (25 mg total) by mouth 3 (three) times daily. 30 tablet 0  . metoprolol succinate (TOPROL-XL) 50 MG 24 hr tablet Take 1 tablet (50 mg total) by mouth every morning. Take with or immediately following a meal. 90 tablet 3  . nitroGLYCERIN (NITROSTAT) 0.4 MG SL tablet Place 0.4 mg under the tongue every 5 (five) minutes as needed for chest pain (MAX 3 TABLETS).     . polyethylene glycol (MIRALAX / GLYCOLAX) packet Take 17 g by mouth daily as needed (constipation).     No current facility-administered medications for this visit.     Past Medical History  Diagnosis Date  . Hypertension   . Diverticulosis of colon   . Osteoarthritis   . Colonic polyp   . Sick sinus syndrome     with paroxysmal atrial fibrillation  . Hyperlipidemia   . Dyslipidemia   . Coronary artery disease     Moderate three vessel obstructive  coronary disease  . Vertigo   . Chronic kidney disease 2011    creat 1.5   . E. coli UTI 02/2013    uniformly sensitive  . Gout     ROS:   All systems reviewed and negative except as noted in the HPI.   Past Surgical History  Procedure Laterality Date  . Cholecystectomy      age 71  . Abdominal hysterectomy    . Oophorectomy      bilat for fibroids @ age 15, anemia pre op due to dysfunctional menses yo  . Tonsillectomy    . Breast biopsy      x 1  . Appendectomy      done at [facility]  . Breast lumpectomy  2009    Dr Margot Chimes  . Paf induced mi, s/p pacer for brady-tach syndrome  04/2010    Dr.jordan  . Other surgical history  hysterectomy  . Cholecystectomy    . Breast biopsy    . Pacemaker insertion  2011     Family History  Problem Relation Age of Onset  . Hyperlipidemia Father   . Stroke Father     TIA,CVA  . Heart failure Father   . Cancer Mother     ? renal primary  . Breast cancer Sister     11/08  . Breast cancer Maternal Aunt   . Colon cancer Maternal Aunt   .  Breast cancer Daughter     lumpectomy 2007;mastectomy 2013  . Heart disease Brother     pacer  . Diabetes Neg Hx      History   Social History  . Marital Status: Widowed    Spouse Name: N/A    Number of Children: N/A  . Years of Education: N/A   Occupational History  . Not on file.   Social History Main Topics  . Smoking status: Never Smoker   . Smokeless tobacco: Not on file  . Alcohol Use: No  . Drug Use: No  . Sexual Activity: Not Currently   Other Topics Concern  . Not on file   Social History Narrative   Regular exercise- no      BP 110/80 mmHg  Pulse 94  Ht 5\' 6"  (1.676 m)  Wt 180 lb 12.8 oz (82.01 kg)  BMI 29.20 kg/m2  Physical Exam:  Well appearing elderly woman, NAD HEENT: Unremarkable Neck:  No JVD, no thyromegally Lungs:  Clear with no wheezes, rales, or rhonchi. HEART:  Regular rate rhythm, no murmurs, no rubs, no clicks Abd:  soft, positive bowel sounds,  no organomegally, no rebound, no guarding Ext:  2 plus pulses, no edema, no cyanosis, no clubbing Skin:  No rashes no nodules Neuro:  CN II through XII intact, motor grossly intact  DEVICE  Normal device function.  See PaceArt for details.   Assess/Plan:

## 2014-10-19 ENCOUNTER — Telehealth: Payer: Self-pay | Admitting: Cardiology

## 2014-10-19 ENCOUNTER — Other Ambulatory Visit: Payer: Self-pay | Admitting: *Deleted

## 2014-10-19 MED ORDER — DABIGATRAN ETEXILATE MESYLATE 150 MG PO CAPS: 150.0000 mg | ORAL_CAPSULE | Freq: Every day | ORAL | Status: DC

## 2014-10-19 NOTE — Telephone Encounter (Signed)
Advised patient that refills for Pradaxa had been sent to [facility] on [address] and would be ready for pickup this afternoon.

## 2014-10-19 NOTE — Telephone Encounter (Signed)
Pt still have not received her Pradaxa from last week. Would you please call it in today,completely out of it. Please call to [place].

## 2015-02-14 ENCOUNTER — Ambulatory Visit (INDEPENDENT_AMBULATORY_CARE_PROVIDER_SITE_OTHER): Payer: Medicare Other | Admitting: *Deleted

## 2015-02-14 DIAGNOSIS — I495 Sick sinus syndrome: Secondary | ICD-10-CM

## 2015-02-14 LAB — MDC_IDC_ENUM_SESS_TYPE_REMOTE
Battery Remaining Longevity: 110 mo
Brady Statistic AP VP Percent: 3 %
Brady Statistic AS VP Percent: 0 %
Brady Statistic AS VS Percent: 4 %
Lead Channel Impedance Value: 544 Ohm
Lead Channel Pacing Threshold Amplitude: 0.625 V
Lead Channel Pacing Threshold Pulse Width: 0.4 ms
Lead Channel Setting Pacing Amplitude: 2 V
Lead Channel Setting Pacing Amplitude: 2.5 V
Lead Channel Setting Pacing Pulse Width: 0.4 ms
Lead Channel Setting Sensing Sensitivity: 4 mV
MDC IDC MSMT BATTERY IMPEDANCE: 250 Ohm
MDC IDC MSMT BATTERY VOLTAGE: 2.79 V
MDC IDC MSMT LEADCHNL RA IMPEDANCE VALUE: 412 Ohm
MDC IDC MSMT LEADCHNL RA PACING THRESHOLD AMPLITUDE: 0.5 V
MDC IDC MSMT LEADCHNL RA PACING THRESHOLD PULSEWIDTH: 0.4 ms
MDC IDC MSMT LEADCHNL RV SENSING INTR AMPL: 5.6 mV
MDC IDC SESS DTM: 20160328160652
MDC IDC STAT BRADY AP VS PERCENT: 92 %

## 2015-02-14 NOTE — Progress Notes (Signed)
Remote pacemaker transmission.   

## 2015-02-23 ENCOUNTER — Encounter: Payer: Self-pay | Admitting: Cardiology

## 2015-03-01 ENCOUNTER — Encounter: Payer: Self-pay | Admitting: Internal Medicine

## 2015-03-15 ENCOUNTER — Ambulatory Visit (INDEPENDENT_AMBULATORY_CARE_PROVIDER_SITE_OTHER): Payer: Medicare Other | Admitting: Cardiology

## 2015-03-15 ENCOUNTER — Encounter: Payer: Self-pay | Admitting: Cardiology

## 2015-03-15 ENCOUNTER — Other Ambulatory Visit: Payer: Self-pay

## 2015-03-15 VITALS — BP 106/70 | HR 76 | Ht 66.0 in | Wt 180.4 lb

## 2015-03-15 DIAGNOSIS — I480 Paroxysmal atrial fibrillation: Secondary | ICD-10-CM

## 2015-03-15 DIAGNOSIS — I2510 Atherosclerotic heart disease of native coronary artery without angina pectoris: Secondary | ICD-10-CM | POA: Diagnosis not present

## 2015-03-15 DIAGNOSIS — I495 Sick sinus syndrome: Secondary | ICD-10-CM

## 2015-03-15 DIAGNOSIS — I10 Essential (primary) hypertension: Secondary | ICD-10-CM | POA: Diagnosis not present

## 2015-03-15 MED ORDER — BENAZEPRIL HCL 40 MG PO TABS: 20.0000 mg | ORAL_TABLET | Freq: Every morning | ORAL | Status: DC

## 2015-03-15 NOTE — Progress Notes (Signed)
Brooke Rios Date of Birth: 02/01/1925 Medical Record #[id number]  History of Present Illness: Brooke Rios is seen today for follow up CAD and SSS. She has SSS with pacemaker in place. Has had PAF, HTN, HLD and moderate 3 vessel CAD per cath back in 2011.  She denies any chest pain or palpitations. She has no further problems with vertigo. Only notes she gets tired a little more easily.   Current Outpatient Prescriptions on File Prior to Visit  Medication Sig Dispense Refill  . benazepril (LOTENSIN) 40 MG tablet Take 0.5 tablets (20 mg total) by mouth every morning. 30 tablet 6  . dabigatran (PRADAXA) 150 MG CAPS capsule Take 1 capsule (150 mg total) by mouth daily. 60 capsule 6  . isosorbide mononitrate (IMDUR) 30 MG 24 hr tablet Take 1 tablet (30 mg total) by mouth every morning. 90 tablet 3  . levothyroxine (SYNTHROID, LEVOTHROID) 25 MCG tablet Take 1 tablet (25 mcg total) by mouth daily before breakfast. 30 tablet 2  . meclizine (ANTIVERT) 25 MG tablet Take 1 tablet (25 mg total) by mouth 3 (three) times daily. 30 tablet 0  . metoprolol succinate (TOPROL-XL) 50 MG 24 hr tablet Take 1 tablet (50 mg total) by mouth every morning. Take with or immediately following a meal. 90 tablet 3  . nitroGLYCERIN (NITROSTAT) 0.4 MG SL tablet Place 0.4 mg under the tongue every 5 (five) minutes as needed for chest pain (MAX 3 TABLETS).     . polyethylene glycol (MIRALAX / GLYCOLAX) packet Take 17 g by mouth daily as needed (constipation).     No current facility-administered medications on file prior to visit.    Allergies  Allergen Reactions  . Cefpodoxime     Nocturia & disturbed sleep  . Codeine     nausea    Past Medical History  Diagnosis Date  . Hypertension   . Diverticulosis of colon   . Osteoarthritis   . Colonic polyp   . Sick sinus syndrome     with paroxysmal atrial fibrillation  . Hyperlipidemia   . Dyslipidemia   . Coronary artery disease     Moderate three vessel  obstructive coronary disease  . Vertigo   . Chronic kidney disease 2011    creat 1.5   . E. coli UTI 02/2013    uniformly sensitive  . Gout     Past Surgical History  Procedure Laterality Date  . Cholecystectomy      age 81  . Abdominal hysterectomy    . Oophorectomy      bilat for fibroids @ age 25, anemia pre op due to dysfunctional menses yo  . Tonsillectomy    . Breast biopsy      x 1  . Appendectomy      done at [facility]  . Breast lumpectomy  2009    Dr Margot Chimes  . Paf induced mi, s/p pacer for brady-tach syndrome  04/2010    Dr.jordan  . Other surgical history  hysterectomy  . Cholecystectomy    . Breast biopsy    . Pacemaker insertion  2011    History  Smoking status  . Never Smoker   Smokeless tobacco  . Not on file    History  Alcohol Use No    Family History  Problem Relation Age of Onset  . Hyperlipidemia Father   . Stroke Father     TIA,CVA  . Heart failure Father   . Cancer Mother     ? renal  primary  . Breast cancer Sister     11/08  . Breast cancer Maternal Aunt   . Colon cancer Maternal Aunt   . Breast cancer Daughter     lumpectomy 2007;mastectomy 2013  . Heart disease Brother     pacer  . Diabetes Neg Hx     Review of Systems: The review of systems is per the HPI.  All other systems were reviewed and are negative.  Physical Exam: There were no vitals taken for this visit. Patient is very pleasant and in no acute distress. Skin is warm and dry. Color is normal.  HEENT is unremarkable. Normocephalic/atraumatic. PERRL. Sclera are nonicteric. Neck is supple. No masses. No JVD. Lungs are clear. Cardiac exam shows a regular rate and rhythm. Abdomen is soft. Extremities are without edema. Gait and ROM are intact. No gross neurologic deficits noted.  LABORATORY DATA:  Lab Results  Component Value Date   WBC 5.6 09/01/2014   HGB 13.1 09/01/2014   HCT 39.4 09/01/2014   PLT 230.0 09/01/2014   GLUCOSE 107* 09/01/2014   CHOL 221* 09/01/2014    TRIG 257.0* 09/01/2014   HDL 28.60* 09/01/2014   LDLDIRECT 123.5 09/01/2014   LDLCALC 122* 09/08/2013   ALT 21 09/01/2014   AST 25 09/01/2014   NA 139 09/01/2014   K 4.2 09/01/2014   CL 102 09/01/2014   CREATININE 1.6* 09/01/2014   BUN 27* 09/01/2014   CO2 28 09/01/2014   TSH 5.68* 09/01/2014   INR 1.31 06/21/2014   HGBA1C 6.0 09/01/2014   MICROALBUR 0.2 07/01/2007    Assessment / Plan:  1. HTN - well  controlled. Continue current therapy.   2. CAD - moderate 3VD per cath back in 2011 - no chest pain. Follow up in 6 months.  3. HLD - on fish oil only. History of intolerance to statins.  4. SSS/PAF - with pacemaker in place - followed by Dr. Lovena Le - managed with rate control and on chronic Pradaxa. Pacemaker check in March was satisfactory with mode switches <0.5%.   5. Vertigo- resolved

## 2015-03-15 NOTE — Patient Instructions (Signed)
Continue your current therapy  I will see you in 6 months.   

## 2015-03-18 ENCOUNTER — Ambulatory Visit (INDEPENDENT_AMBULATORY_CARE_PROVIDER_SITE_OTHER): Payer: Medicare Other | Admitting: Physician Assistant

## 2015-03-18 VITALS — BP 110/68 | HR 78 | Temp 98.0°F | Resp 16 | Ht 66.0 in | Wt 183.5 lb

## 2015-03-18 DIAGNOSIS — L989 Disorder of the skin and subcutaneous tissue, unspecified: Secondary | ICD-10-CM

## 2015-03-18 DIAGNOSIS — I2510 Atherosclerotic heart disease of native coronary artery without angina pectoris: Secondary | ICD-10-CM | POA: Diagnosis not present

## 2015-03-18 NOTE — Patient Instructions (Addendum)
We think the lesion is a Keratoacanthoma. However, it's possible that it is a skin cancer. Either way, I needs to be removed by the dermatologist, and next Thursday is fine. Please continue using the Neosporin, and you may take acetaminophen (Tylenol) if you need to for pain.

## 2015-03-18 NOTE — Progress Notes (Signed)
   Subjective:    Patient ID: Brooke Rios, female    DOB: 1925-04-08, 81 y.o.   MRN: [id number]  HPI  Devinn Rios is a 81 year old female who presents today for evaluation of a skin lesion on her left hand.  She noticed the lesion began about 2 months ago. She doesn't remember any specific event, injury, or insect bite that could have caused the lesion. It is painful to the touch, but is not itchy. She has skin allergies and is very sensitive to skin care products and insect bites. For the past 2 weeks, she has been applying topical Neosporin to the area and covering it with a Band-Aid at night, which seems to help and has dried it out some. She has not taken anything else for pain. The area has decreased in size from what it originally was, but is still tender. Denies any fever, chills, nausea, or vomiting.   She made an appointment with dermatology and thought the appointment was yesterday. When she showed up, she was told that her appointment was in fact next Thursday 03/24/15. Her daughter is planning to go out of town tomorrow but didn't want to leave until she had lesion checked out.   Review of Systems  Constitutional: Negative for fever, chills and fatigue.  HENT: Negative.   Respiratory: Negative.   Cardiovascular: Negative.   Gastrointestinal: Negative for nausea and vomiting.  Skin:       Tender lesion to left hand.  Allergic/Immunologic: Positive for environmental allergies.  Neurological: Negative.        Objective:   Physical Exam  Constitutional: She is oriented to person, place, and time. She appears well-developed and well-nourished. No distress.  BP 110/68 mmHg  Pulse 78  Temp(Src) 98 F (36.7 C) (Oral)  Resp 16  Ht '5\' 6"'$  (1.676 m)  Wt 183 lb 8 oz (83.235 kg)  BMI 29.63 kg/m2  SpO2 96%  HENT:  Head: Normocephalic and atraumatic.  Cardiovascular: Normal rate and regular rhythm.   Musculoskeletal:       Left hand: She exhibits tenderness.        Hands: Neurological: She is alert and oriented to person, place, and time.  Skin: Skin is warm and dry. Lesion (Dorsal aspect of left hand) noted. No erythema.  Psychiatric: She has a normal mood and affect.      Assessment & Plan:  1. Skin lesion of hand Patient already has an appointment with dermatology scheduled for Thursday 03/24/15. Encouraged her to keep that appointment as the lesion would need to be excised by dermatology. We think it is a keratoacanthoma, but there is a potential that it is a skin cancer. Either way, it can be treated through dermatology. Advised her to continue applying Neosporin topically to the area, and to use Tylenol as needed for pain.

## 2015-03-19 NOTE — Progress Notes (Signed)
Patient ID: Brooke Rios, female    DOB: 1925-11-11, 81 y.o.   MRN: [id number]  PCP: Unice Cobble, MD  Subjective:   Chief Complaint  Patient presents with  . Cyst    cyst on top of her left hand x several months, very painful to touch    HPI Presents for evaluation of a skin lesion on the dorsum of her left hand.   She noticed the lesion began about 2 months ago. She doesn't remember any specific event, injury, or insect bite that could have caused the lesion. It is painful to the touch, but is not itchy. She has skin allergies and is very sensitive to skin care products and insect bites. For the past 2 weeks, she has been applying topical Neosporin to the area and covering it with a Band-Aid at night, which seems to help and has "dried it out some." She has not taken anything for pain. The area has decreased in size from what it originally was, but is still tender. Denies any fever, chills, nausea, or vomiting.   She made an appointment with dermatology and thought the appointment was yesterday. When she showed up, she was told that her appointment was in fact next Thursday 03/24/15. Her daughter, a Marine scientist, is planning to go out of town tomorrow but didn't want to leave until she had lesion checked out.  .   Review of Systems  Constitutional: Negative for fever and chills.  Skin:       Skin lesion, dorsum of LEFT hand       Patient Active Problem List   Diagnosis Date Noted  . Vertigo 06/21/2014  . Vomiting 06/21/2014  . History of anemia 09/08/2013  . Gout 05/22/2013  . Pacemaker-Medtronic 09/10/2012  . Unspecified adverse effect of unspecified drug, medicinal and biological substance 06/12/2012  . Hyperglycemia 06/12/2012  . Tachy-brady syndrome 02/05/2012  . Atrial fibrillation 09/11/2011  . Coronary artery disease   . HIP PAIN 10/17/2010  . DIZZINESS 11/22/2009  . RENAL DISEASE, CHRONIC, MILD 06/08/2009  . BENIGN PAROXYSMAL POSITIONAL VERTIGO 12/30/2008  .  BRADYCARDIA, CHRONIC 12/30/2008  . DIVERTICULOSIS, COLON 12/30/2008  . OSTEOARTHRITIS 12/30/2008  . COLONIC POLYPS, HX OF 12/30/2008  . Hyperlipidemia 12/24/2007  . Thrombocytopenia 12/24/2007  . Essential hypertension 12/24/2007  . FIBROCYSTIC BREAST DISEASE 12/24/2007     Prior to Admission medications   Medication Sig Start Date End Date Taking? Authorizing Provider  benazepril (LOTENSIN) 40 MG tablet Take 0.5 tablets (20 mg total) by mouth every morning. 03/15/15  Yes Peter M Martinique, MD  dabigatran (PRADAXA) 150 MG CAPS capsule Take 1 capsule (150 mg total) by mouth daily. 10/19/14  Yes Peter M Martinique, MD  isosorbide mononitrate (IMDUR) 30 MG 24 hr tablet Take 1 tablet (30 mg total) by mouth every morning. 07/19/14  Yes Peter M Martinique, MD  metoprolol succinate (TOPROL-XL) 50 MG 24 hr tablet Take 1 tablet (50 mg total) by mouth every morning. Take with or immediately following a meal. 07/19/14  Yes Peter M Martinique, MD  nitroGLYCERIN (NITROSTAT) 0.4 MG SL tablet Place 0.4 mg under the tongue every 5 (five) minutes as needed for chest pain (MAX 3 TABLETS).  08/26/12  Yes Peter M Martinique, MD  polyethylene glycol [facility] / Floria Raveling) packet Take 17 g by mouth daily as needed (constipation).   Yes Historical Provider, MD     Allergies  Allergen Reactions  . Cefpodoxime     Nocturia & disturbed sleep  . Codeine  nausea       Objective:  Physical Exam  Constitutional: She is oriented to person, place, and time. She appears well-developed and well-nourished. She is active and cooperative. No distress.  BP 110/68 mmHg  Pulse 78  Temp(Src) 98 F (36.7 C) (Oral)  Resp 16  Ht '5\' 6"'$  (1.676 m)  Wt 183 lb 8 oz (83.235 kg)  BMI 29.63 kg/m2  SpO2 96%   Eyes: Conjunctivae are normal.  Pulmonary/Chest: Effort normal.  Neurological: She is alert and oriented to person, place, and time.  Skin: Lesion noted.     1 cm raised, tender lesion on the dorsum of the LEFT hand. Small central scale  noted.  Psychiatric: She has a normal mood and affect. Her speech is normal and behavior is normal.           Assessment & Plan:   1. Skin lesion of hand Seen with Dr. Everlene Farrier. Suspect this is a keratoacanthoma though malignancy is not excluded. Proceed with dermatology evaluation. Anticipate excisional biopsy. Continue with antibiotic ointment for now, since it seems to help.   Fara Chute, PA-C Physician Assistant-Certified [facility]

## 2015-03-24 ENCOUNTER — Other Ambulatory Visit (HOSPITAL_COMMUNITY): Payer: Self-pay | Admitting: Physician Assistant

## 2015-03-24 DIAGNOSIS — L821 Other seborrheic keratosis: Secondary | ICD-10-CM | POA: Diagnosis not present

## 2015-03-24 DIAGNOSIS — D485 Neoplasm of uncertain behavior of skin: Secondary | ICD-10-CM | POA: Diagnosis not present

## 2015-03-24 DIAGNOSIS — D239 Other benign neoplasm of skin, unspecified: Secondary | ICD-10-CM | POA: Diagnosis not present

## 2015-04-12 ENCOUNTER — Other Ambulatory Visit: Payer: Self-pay

## 2015-04-12 DIAGNOSIS — Z1231 Encounter for screening mammogram for malignant neoplasm of breast: Secondary | ICD-10-CM

## 2015-05-10 ENCOUNTER — Ambulatory Visit
Admission: RE | Admit: 2015-05-10 | Discharge: 2015-05-10 | Disposition: A | Discharge status: Home / Self Care | Payer: Medicare Other | Source: Ambulatory Visit

## 2015-05-10 DIAGNOSIS — Z1231 Encounter for screening mammogram for malignant neoplasm of breast: Secondary | ICD-10-CM

## 2015-05-11 ENCOUNTER — Other Ambulatory Visit: Payer: Self-pay | Admitting: Cardiology

## 2015-05-11 DIAGNOSIS — R928 Other abnormal and inconclusive findings on diagnostic imaging of breast: Secondary | ICD-10-CM

## 2015-05-12 ENCOUNTER — Other Ambulatory Visit: Payer: Self-pay | Admitting: Internal Medicine

## 2015-05-12 DIAGNOSIS — R928 Other abnormal and inconclusive findings on diagnostic imaging of breast: Secondary | ICD-10-CM

## 2015-05-13 DIAGNOSIS — Z124 Encounter for screening for malignant neoplasm of cervix: Secondary | ICD-10-CM | POA: Diagnosis not present

## 2015-05-13 DIAGNOSIS — Z6829 Body mass index (BMI) 29.0-29.9, adult: Secondary | ICD-10-CM | POA: Diagnosis not present

## 2015-05-13 DIAGNOSIS — Z1272 Encounter for screening for malignant neoplasm of vagina: Secondary | ICD-10-CM | POA: Diagnosis not present

## 2015-05-13 DIAGNOSIS — L904 Acrodermatitis chronica atrophicans: Secondary | ICD-10-CM | POA: Diagnosis not present

## 2015-05-13 DIAGNOSIS — Z01419 Encounter for gynecological examination (general) (routine) without abnormal findings: Secondary | ICD-10-CM | POA: Diagnosis not present

## 2015-05-13 DIAGNOSIS — Z90710 Acquired absence of both cervix and uterus: Secondary | ICD-10-CM | POA: Diagnosis not present

## 2015-05-16 ENCOUNTER — Encounter: Payer: Self-pay | Admitting: Internal Medicine

## 2015-05-16 ENCOUNTER — Ambulatory Visit (INDEPENDENT_AMBULATORY_CARE_PROVIDER_SITE_OTHER): Payer: Medicare Other | Admitting: *Deleted

## 2015-05-16 DIAGNOSIS — I495 Sick sinus syndrome: Secondary | ICD-10-CM

## 2015-05-16 NOTE — Progress Notes (Signed)
Remote pacemaker transmission.   

## 2015-05-17 ENCOUNTER — Ambulatory Visit
Admission: RE | Admit: 2015-05-17 | Discharge: 2015-05-17 | Disposition: A | Discharge status: Home / Self Care | Payer: Medicare Other | Source: Ambulatory Visit | Attending: Cardiology | Admitting: Cardiology

## 2015-05-17 DIAGNOSIS — R928 Other abnormal and inconclusive findings on diagnostic imaging of breast: Secondary | ICD-10-CM | POA: Diagnosis not present

## 2015-05-18 ENCOUNTER — Telehealth: Payer: Self-pay | Admitting: Cardiology

## 2015-05-18 LAB — CUP PACEART REMOTE DEVICE CHECK
Battery Impedance: 250 Ohm
Battery Voltage: 2.79 V
Brady Statistic AP VS Percent: 92 %
Brady Statistic AS VP Percent: 0 %
Brady Statistic AS VS Percent: 4 %
Date Time Interrogation Session: 20160627132054
Lead Channel Impedance Value: 460 Ohm
Lead Channel Pacing Threshold Amplitude: 0.5 V
Lead Channel Pacing Threshold Amplitude: 0.625 V
Lead Channel Pacing Threshold Pulse Width: 0.4 ms
Lead Channel Setting Pacing Amplitude: 2.5 V
Lead Channel Setting Pacing Pulse Width: 0.4 ms
MDC IDC MSMT BATTERY REMAINING LONGEVITY: 109 mo
MDC IDC MSMT LEADCHNL RA IMPEDANCE VALUE: 398 Ohm
MDC IDC MSMT LEADCHNL RV PACING THRESHOLD PULSEWIDTH: 0.4 ms
MDC IDC MSMT LEADCHNL RV SENSING INTR AMPL: 5.6 mV — AB
MDC IDC SET LEADCHNL RA PACING AMPLITUDE: 2 V
MDC IDC SET LEADCHNL RV SENSING SENSITIVITY: 2.8 mV
MDC IDC STAT BRADY AP VP PERCENT: 3 %

## 2015-05-18 NOTE — Telephone Encounter (Signed)
Returning your call. °

## 2015-05-19 NOTE — Telephone Encounter (Signed)
Returned call to patient Dr.Jordan received mammogram report.Advised to follow up with PCP.Ultrasound recommended.Stated she already had ultrasound.Advised to follow up with PCP Dr.Hopper.Mammogram sent to Dr.Hopper.

## 2015-05-19 NOTE — Telephone Encounter (Signed)
Brooke Rios is returning your call Malachy Mood . Please call   Thanks

## 2015-05-19 NOTE — Telephone Encounter (Signed)
Returned call to patient no answer.LMTC. 

## 2015-06-02 DIAGNOSIS — L409 Psoriasis, unspecified: Secondary | ICD-10-CM | POA: Diagnosis not present

## 2015-06-02 DIAGNOSIS — L570 Actinic keratosis: Secondary | ICD-10-CM | POA: Diagnosis not present

## 2015-06-08 ENCOUNTER — Ambulatory Visit: Payer: Medicare Other | Admitting: Internal Medicine

## 2015-06-14 ENCOUNTER — Other Ambulatory Visit: Payer: Self-pay

## 2015-06-14 MED ORDER — DABIGATRAN ETEXILATE MESYLATE 150 MG PO CAPS: 150.0000 mg | ORAL_CAPSULE | Freq: Every day | ORAL | Status: DC

## 2015-06-15 ENCOUNTER — Encounter: Payer: Self-pay | Admitting: *Deleted

## 2015-06-22 DIAGNOSIS — L904 Acrodermatitis chronica atrophicans: Secondary | ICD-10-CM | POA: Diagnosis not present

## 2015-07-12 ENCOUNTER — Other Ambulatory Visit: Payer: Self-pay | Admitting: *Deleted

## 2015-07-12 MED ORDER — ISOSORBIDE MONONITRATE ER 30 MG PO TB24: 30.0000 mg | ORAL_TABLET | Freq: Every morning | ORAL | Status: DC

## 2015-07-12 MED ORDER — METOPROLOL SUCCINATE ER 50 MG PO TB24: 50.0000 mg | ORAL_TABLET | Freq: Every morning | ORAL | Status: DC

## 2015-07-15 ENCOUNTER — Telehealth: Payer: Self-pay | Admitting: Cardiology

## 2015-07-15 NOTE — Telephone Encounter (Signed)
Please call,said she talked to you earlier today.She had some more information for you.

## 2015-07-15 NOTE — Telephone Encounter (Signed)
Spoke with patient's daughter. She reports she spoke with her sister who informed her that patient had chest pain yesterday. She also reports that her mom was pale during the episodes. This was more concerning to her.   Appointment moved from 9/2 to 8/29 @ 10am with B. Rosita Fire, [place]

## 2015-07-15 NOTE — Telephone Encounter (Addendum)
Remote received. All device functions & diagnostics normal. Per [name], pt does take Pradaxa as directed. Pt in AFib 0.8% of time. Current rhythm was Atrial paced w/ intrinsic Ventricular response.   Judy aware ROV 07/18/15 to discuss chest pain episodes addressed below.

## 2015-07-15 NOTE — Telephone Encounter (Signed)
New message      Pt c/o of Chest Pain: STAT if CP now or developed within 24 hours  1. Are you having CP right now? no 2. Are you experiencing any other symptoms (ex. SOB, nausea, vomiting, sweating)?  no  3. How long have you been experiencing CP?  Last episode last Tuesday--- 4. Is your CP continuous or coming and going?  Comes and goes  5. Have you taken Nitroglycerin?  Taking nitro makes it go away Daughter is concerned. ?

## 2015-07-15 NOTE — Telephone Encounter (Signed)
Spoke with patient's daughter. She reports that her mother has a pacemaker and was told she has had multiple blockages. She has never had cardiac symptoms until recently. About 2 weeks ago on a Tuesday, patient had chest pain in center of her chest and took NTG x1 with relief. Patient thought ths was just indigestion as she had been cooking and ate pizza that day. A week later, she had the same chest pain and took NTG x1 with relief. On Sunday, she had left back and left shoulder pain and took NTG x1 with relief.   Advised that patient send device download.   Offered OV with Dr. Martinique on 9/2 @ 0930 (after discussing with Malachy Mood, LPN) or sooner flex clinic appointment - daughter preferred appt with Dr. Martinique.   She will assist her mother in sending device download.   She will call back if her mother's symptoms persist, increase in frequency or get worse.

## 2015-07-18 ENCOUNTER — Encounter: Payer: Self-pay | Admitting: Cardiology

## 2015-07-18 ENCOUNTER — Ambulatory Visit (INDEPENDENT_AMBULATORY_CARE_PROVIDER_SITE_OTHER): Payer: Medicare Other | Admitting: Cardiology

## 2015-07-18 VITALS — BP 130/74 | Ht 66.0 in | Wt 182.0 lb

## 2015-07-18 DIAGNOSIS — I10 Essential (primary) hypertension: Secondary | ICD-10-CM | POA: Diagnosis not present

## 2015-07-18 DIAGNOSIS — I2510 Atherosclerotic heart disease of native coronary artery without angina pectoris: Secondary | ICD-10-CM

## 2015-07-18 MED ORDER — ISOSORBIDE MONONITRATE ER 60 MG PO TB24: 60.0000 mg | ORAL_TABLET | Freq: Every day | ORAL | Status: DC

## 2015-07-18 NOTE — Progress Notes (Signed)
07/18/2015 Brooke Rios   1925/03/11  [id number]  Primary Physician Unice Cobble, MD Primary Cardiologist: Dr.Jordan Electrophysiologist: Dr. Lovena Le  Reason for Visit/CC: Chest pain  HPI:  The patient is a 81 year old female, followed by Dr. Martinique, who presents to clinic today with a chief complaint of chest pain. She has a history of CAD as well as sick sinus syndrome, status post permanent pacemaker insertion, which is followed by Dr. Lovena Le. She had a heart catheterization in 2011 which revealed moderate multivessel CAD. Per cath report, she had a 20-30% left main lesion. 60-70% proximal LAD stenosis. There was an 80% segmental stenosis following the takeoff of the third diagonal branch in the mid LAD. The first diagonal branch had an 80-90% proximal stenosis. The second diagonal branch had 70-80% proximal stenosis. The third diagonal branch had a 70% ostial stenosis. In the left circumflex, there was 30% stenosis in the first obtuse marginal vessel as well as 30% disease in the mid circumflex. The right coronary artery was a large dominant vessel. The posterior descending artery had a 90% stenosis in the mid vessel. According to Dr. Martinique, she was not a suitable candidate for percutaneous intervention with stents given her anatomy. Given her age and normal left ventricular systolic function, Dr. Martinique recommended aggressive medical therapy. He had noted that if she continued to have refractory anginal symptoms despite aggressive medical therapy, she would be a candidate for bypass surgery. At that time, she was placed on beta blocker therapy as well as long acting nitrate therapy, and has done well with this until recently. She has been intolerant to statin therapy.  She also has a history notable for paroxysmal atrial fibrillation for which she takes Pradaxa, hypertension and hyperlipidemia. Her last office visit with Dr. Martinique was 03/15/2015. At that time, she denied any chest discomfort  and was felt to be stable from a cardiac standpoint. She was instructed to follow-up in 6 months.  She now presents back to clinic accompanied by her daughter. She reports a three-week history of intermittent chest discomfort. Symptoms are substernal also radiating to her back and have occurred at rest, however her symptoms have been quickly relieved with sublingual nitroglycerin.  Symptoms usually only lasts less than 5 minutes. She denies any associated symptoms of dyspnea, palpitations, lightheadedness, dizziness, syncope/near-syncope. She reports full medication compliance. She enies any abnormal bleeding on Pradaxa.  Her EKG in the office today demonstrates normal sinus rhythm without any ischemic abnormalities. There are no significant changes compared to prior EKG in 2015.   Current Outpatient Prescriptions  Medication Sig Dispense Refill  . benazepril (LOTENSIN) 40 MG tablet Take 0.5 tablets (20 mg total) by mouth every morning. 30 tablet 6  . dabigatran (PRADAXA) 150 MG CAPS capsule Take 1 capsule (150 mg total) by mouth daily. 60 capsule 3  . isosorbide mononitrate (IMDUR) 30 MG 24 hr tablet Take 1 tablet (30 mg total) by mouth every morning. 90 tablet 3  . metoprolol succinate (TOPROL-XL) 50 MG 24 hr tablet Take 1 tablet (50 mg total) by mouth every morning. Take with or immediately following a meal. 90 tablet 3  . nitroGLYCERIN (NITROSTAT) 0.4 MG SL tablet Place 0.4 mg under the tongue every 5 (five) minutes as needed for chest pain (MAX 3 TABLETS).     . polyethylene glycol (MIRALAX / GLYCOLAX) packet Take 17 g by mouth daily as needed (constipation).     No current facility-administered medications for this visit.    Allergies  Allergen Reactions  . Cefpodoxime     Nocturia & disturbed sleep  . Codeine     nausea    Social History   Social History  . Marital Status: Widowed    Spouse Name: N/A  . Number of Children: N/A  . Years of Education: N/A   Occupational  History  . Not on file.   Social History Main Topics  . Smoking status: Never Smoker   . Smokeless tobacco: Not on file  . Alcohol Use: No  . Drug Use: No  . Sexual Activity: Not Currently   Other Topics Concern  . Not on file   Social History Narrative   Regular exercise- no      Review of Systems: General: negative for chills, fever, night sweats or weight changes.  Cardiovascular: negative for chest pain, dyspnea on exertion, edema, orthopnea, palpitations, paroxysmal nocturnal dyspnea or shortness of breath Dermatological: negative for rash Respiratory: negative for cough or wheezing Urologic: negative for hematuria Abdominal: negative for nausea, vomiting, diarrhea, bright red blood per rectum, melena, or hematemesis Neurologic: negative for visual changes, syncope, or dizziness All other systems reviewed and are otherwise negative except as noted above.    Blood pressure 130/74, height '5\' 6"'$  (1.676 m), weight 182 lb (82.555 kg).  General appearance: alert, cooperative and no distress Neck: no carotid bruit and no JVD Lungs: clear to auscultation bilaterally Heart: regular rate and rhythm and 2/6 SM at RSB Extremities: extremities normal, atraumatic, no cyanosis or edema Pulses: 2+ and symmetric Skin: Skin color, texture, turgor normal. No rashes or lesions or warm and dry Neurologic: Grossly normal  EKG NSR. No ischemia 70 bpm.  ASSESSMENT AND PLAN:   1. CAD/Angina: nitrate reponsive. No ischemic abnormalities on EKG. Will increase Imdur to 60 mg daily (SBP stable in the 130s). Continue 50 mg of Metoprolol daily. No ASA given use of Pradaxa. Intolerant to statins.   2. PAF: no recent symptoms of afib. EKG shows NSR with controlled HR. Continue metoprolol for rate control. Continue Pradaxa for stroke prophylaxis.   3. SSS: s/p PPM, followed by Dr. Lovena Le. Pacerspikes noted on EKG.  4. HTN: BP well controlled. Patient advised to monitor BP at home as Imdur is  being increased to 60 mg.   5. HLD: followed by PCP. Intolerant to statins.   PLAN  Continue aggressive medical therapy for CAD. Patient advised to increase Imdur to 60 mg given recent angina. She was instructed to keep f/u with Dr. Martinique in 2 months. Patient advised to notify our office if any recurrent CP despite change in medical therapy. She verbalized understanding.   Lyda Jester PA-C 07/18/2015 10:21 AM

## 2015-07-18 NOTE — Patient Instructions (Signed)
Your physician has recommended you make the following change in your medication: the isosorbide has been increased from 30 mg daily to 60 mg daily. You can take (2) of the 30 mg tablets until prescription completed then fill the 60 mg prescription given to you today.   Your physician recommends that you keep scheduled appointment with Dr. Martinique.

## 2015-07-22 ENCOUNTER — Ambulatory Visit: Payer: Medicare Other | Admitting: Cardiology

## 2015-08-18 ENCOUNTER — Ambulatory Visit (INDEPENDENT_AMBULATORY_CARE_PROVIDER_SITE_OTHER): Payer: Medicare Other | Admitting: Internal Medicine

## 2015-08-18 ENCOUNTER — Telehealth: Payer: Self-pay | Admitting: Cardiology

## 2015-08-18 ENCOUNTER — Encounter: Payer: Self-pay | Admitting: Internal Medicine

## 2015-08-18 ENCOUNTER — Other Ambulatory Visit (INDEPENDENT_AMBULATORY_CARE_PROVIDER_SITE_OTHER): Payer: Medicare Other

## 2015-08-18 VITALS — BP 152/82 | HR 86 | Temp 97.7°F | Resp 18 | Wt 182.0 lb

## 2015-08-18 DIAGNOSIS — M79672 Pain in left foot: Secondary | ICD-10-CM

## 2015-08-18 DIAGNOSIS — Z8639 Personal history of other endocrine, nutritional and metabolic disease: Secondary | ICD-10-CM

## 2015-08-18 DIAGNOSIS — Z8739 Personal history of other diseases of the musculoskeletal system and connective tissue: Secondary | ICD-10-CM

## 2015-08-18 DIAGNOSIS — I2510 Atherosclerotic heart disease of native coronary artery without angina pectoris: Secondary | ICD-10-CM | POA: Diagnosis not present

## 2015-08-18 LAB — URIC ACID: URIC ACID, SERUM: 8.2 mg/dL — AB (ref 2.4–7.0)

## 2015-08-18 MED ORDER — PREDNISONE 10 MG PO TABS: ORAL_TABLET | ORAL | Status: DC

## 2015-08-18 MED ORDER — DABIGATRAN ETEXILATE MESYLATE 150 MG PO CAPS: 150.0000 mg | ORAL_CAPSULE | Freq: Two times a day (BID) | ORAL | Status: DC

## 2015-08-18 NOTE — Progress Notes (Signed)
   Subjective:    Patient ID: Brooke Rios, female    DOB: [birth year], 81 y.o.   MRN: [id number]  HPI She awoke this am with soreness over the lateral aspect of the left foot. There was been no trigger or injury and she had no symptoms prior to going to sleep. As she walked the pain increased.  She does have a past history of gout of the middle toe of the left foot.  She does not drink alcohol and does not eat excess red meat. She is not on HCTZ.  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 This is not associated with any neuromuscular symptoms.   Review of Systems She denies fever, chills, or sweats. There is no numbness, tingling or weakness in the lower extremities. She has no loss control bladder bowels.No associated rash present.    Objective:   Physical Exam Pertinent or positive findings include: She has a grade 1 systolic murmur. Dorsalis pedis pulses are decreased. She has lipomatous changes around the lateral malleoli. There is a very faint erythema 4 x 3.5 cm over the lateral aspect of the left foot. This is not associated with increased temperature. She has mild DIP changes in hands.   General appearance :adequately nourished; in no distress.  Eyes: No conjunctival inflammation or scleral icterus is present.  Heart:  Normal rate and regular rhythm. S1 and S2 normal without gallop, murmur, click, rub or other extra sounds    Lungs:Chest clear to auscultation; no wheezes, rhonchi,rales ,or rubs present.No increased work of breathing.   Abdomen: bowel sounds normal, soft and non-tender without masses, organomegaly or hernias noted.  No guarding or rebound. No flank tenderness to percussion.  Vascular : all pulses equal ; no bruits present.  Skin:Warm & dry.  Intact without suspicious lesions or rashes ; no tenting or jaundice   Lymphatic: No lymphadenopathy is noted about the head, neck, axilla.   Neuro:  Strength, tone normal.      Assessment & Plan:  #1 left foot pain without injury or trigger. Pain was present prior to ambulation.  #2 history of gout  Plan: See orders and recommendations

## 2015-08-18 NOTE — Telephone Encounter (Signed)
E sent to pharmacy   pradaxa 150 mg twice a day   #180 x3

## 2015-08-18 NOTE — Patient Instructions (Signed)
  Your next office appointment will be determined based upon review of your pending labs. Those written interpretation of the lab results and instructions will be transmitted to you by mail for your records.  Critical results will be called.   Followup as needed for any active or acute issue. Please report any significant change in your symptoms. 

## 2015-08-18 NOTE — Telephone Encounter (Signed)
°  1. Which medications need to be refilled?Pradaxa  2. Which pharmacy is medication to be sent to?[facility] on [address]   3. Do they need a 30 day or 90 day supply? 90   4. Would they like a call back once the medication has been sent to the pharmacy? No

## 2015-08-18 NOTE — Telephone Encounter (Signed)
Spoke to pharmacist . Patient is there to pick up medications. PRADAXA 150 MG Patient informed pharmacist she takes PRADAXA once a day instead of twice a day as prescription was e-sent . RN had e-sent prescription earlier for Pradaxa 150 mg one tablet twice a day #180 with 3 refills.  RN informed pharmacist -reviewed patient's chart no documentation of change. But documentation  Of direction (once a day direction, but received quantity 60 tablets given) was done sometime late 2015 around Oct - Dec 2015.  RN informed pharmacist will have to defer to Dr Martinique with the direction  and contact [facility] afterwards . Dr Martinique will be in tomorrow. Pharmacist verbalized understanding. Notified [facility] LPN

## 2015-08-18 NOTE — Progress Notes (Signed)
Pre visit review using our clinic review tool, if applicable. No additional management support is needed unless otherwise documented below in the visit note. 

## 2015-08-19 ENCOUNTER — Other Ambulatory Visit: Payer: Self-pay | Admitting: Internal Medicine

## 2015-08-19 DIAGNOSIS — I10 Essential (primary) hypertension: Secondary | ICD-10-CM

## 2015-08-19 DIAGNOSIS — R739 Hyperglycemia, unspecified: Secondary | ICD-10-CM

## 2015-08-19 DIAGNOSIS — N182 Chronic kidney disease, stage 2 (mild): Secondary | ICD-10-CM

## 2015-08-19 DIAGNOSIS — R7989 Other specified abnormal findings of blood chemistry: Secondary | ICD-10-CM

## 2015-08-19 DIAGNOSIS — E785 Hyperlipidemia, unspecified: Secondary | ICD-10-CM

## 2015-08-19 DIAGNOSIS — D696 Thrombocytopenia, unspecified: Secondary | ICD-10-CM

## 2015-08-19 NOTE — Telephone Encounter (Signed)
Patient called in reference to her Pradaxa and her message she left yesterday. She took her last pill yesterday and needs an answer today in reference to her RX.  Stated that she doesn't want to pay for extra unless she has too

## 2015-08-19 NOTE — Telephone Encounter (Signed)
Returned call to patient no answer.Left message on personal voice mail spoke to [place] he advised needs to take pradaxa 150 mg twice a day.Advised pharmacy has prescription ready for pick up.

## 2015-08-31 ENCOUNTER — Telehealth: Payer: Self-pay | Admitting: Emergency Medicine

## 2015-08-31 NOTE — Telephone Encounter (Signed)
Spoke with pt to inform to come in to have labs drawn. Pt plans to come Monday 10/17. Pt needs OV after labs have been drawn.Results have been mailed.

## 2015-09-01 ENCOUNTER — Other Ambulatory Visit (INDEPENDENT_AMBULATORY_CARE_PROVIDER_SITE_OTHER): Payer: Medicare Other

## 2015-09-01 DIAGNOSIS — E785 Hyperlipidemia, unspecified: Secondary | ICD-10-CM

## 2015-09-01 DIAGNOSIS — N182 Chronic kidney disease, stage 2 (mild): Secondary | ICD-10-CM

## 2015-09-01 DIAGNOSIS — R946 Abnormal results of thyroid function studies: Secondary | ICD-10-CM | POA: Diagnosis not present

## 2015-09-01 DIAGNOSIS — D696 Thrombocytopenia, unspecified: Secondary | ICD-10-CM

## 2015-09-01 DIAGNOSIS — R739 Hyperglycemia, unspecified: Secondary | ICD-10-CM

## 2015-09-01 DIAGNOSIS — I10 Essential (primary) hypertension: Secondary | ICD-10-CM | POA: Diagnosis not present

## 2015-09-01 DIAGNOSIS — R7989 Other specified abnormal findings of blood chemistry: Secondary | ICD-10-CM

## 2015-09-01 LAB — CBC WITH DIFFERENTIAL/PLATELET
BASOS ABS: 0 10*3/uL (ref 0.0–0.1)
Basophils Relative: 0.5 % (ref 0.0–3.0)
EOS PCT: 2 % (ref 0.0–5.0)
Eosinophils Absolute: 0.1 10*3/uL (ref 0.0–0.7)
HEMATOCRIT: 39.1 % (ref 36.0–46.0)
Hemoglobin: 12.7 g/dL (ref 12.0–15.0)
LYMPHS PCT: 21.6 % (ref 12.0–46.0)
Lymphs Abs: 1.3 10*3/uL (ref 0.7–4.0)
MCHC: 32.5 g/dL (ref 30.0–36.0)
MCV: 83.4 fl (ref 78.0–100.0)
Monocytes Absolute: 0.4 10*3/uL (ref 0.1–1.0)
Monocytes Relative: 7.1 % (ref 3.0–12.0)
NEUTROS PCT: 68.8 % (ref 43.0–77.0)
Neutro Abs: 4.2 10*3/uL (ref 1.4–7.7)
PLATELETS: 260 10*3/uL (ref 150.0–400.0)
RBC: 4.68 Mil/uL (ref 3.87–5.11)
RDW: 15.9 % — AB (ref 11.5–15.5)
WBC: 6.1 10*3/uL (ref 4.0–10.5)

## 2015-09-01 LAB — TSH: TSH: 4.01 u[IU]/mL (ref 0.35–4.50)

## 2015-09-01 LAB — LIPID PANEL
CHOLESTEROL: 172 mg/dL (ref 0–200)
HDL: 30.5 mg/dL — AB (ref >39.00)
LDL Cholesterol: 107 mg/dL — ABNORMAL HIGH (ref 0–99)
NonHDL: 141.04
TRIGLYCERIDES: 171 mg/dL — AB (ref 0.0–149.0)
Total CHOL/HDL Ratio: 6
VLDL: 34.2 mg/dL (ref 0.0–40.0)

## 2015-09-01 LAB — HEPATIC FUNCTION PANEL
ALBUMIN: 3.9 g/dL (ref 3.5–5.2)
ALT: 14 U/L (ref 0–35)
AST: 12 U/L (ref 0–37)
Alkaline Phosphatase: 88 U/L (ref 39–117)
Bilirubin, Direct: 0.1 mg/dL (ref 0.0–0.3)
TOTAL PROTEIN: 6.9 g/dL (ref 6.0–8.3)
Total Bilirubin: 0.6 mg/dL (ref 0.2–1.2)

## 2015-09-01 LAB — BASIC METABOLIC PANEL
BUN: 21 mg/dL (ref 6–23)
CALCIUM: 9.8 mg/dL (ref 8.4–10.5)
CO2: 29 meq/L (ref 19–32)
Chloride: 103 mEq/L (ref 96–112)
Creatinine, Ser: 1.59 mg/dL — ABNORMAL HIGH (ref 0.40–1.20)
GFR: 32.58 mL/min — ABNORMAL LOW (ref >60.00)
Glucose, Bld: 109 mg/dL — ABNORMAL HIGH (ref 70–99)
POTASSIUM: 5 meq/L (ref 3.5–5.1)
SODIUM: 140 meq/L (ref 135–145)

## 2015-09-01 LAB — T4, FREE: Free T4: 0.88 ng/dL (ref 0.60–1.60)

## 2015-09-01 LAB — HEMOGLOBIN A1C: Hgb A1c MFr Bld: 6.2 % (ref 4.6–6.5)

## 2015-09-06 ENCOUNTER — Telehealth: Payer: Self-pay | Admitting: Internal Medicine

## 2015-09-06 NOTE — Telephone Encounter (Signed)
Pt's daughter, [name], called request to speak to the assistant concern about lab result, they do not understand it. Please [name] back

## 2015-09-06 NOTE — Telephone Encounter (Signed)
Spoke with daughter, labs have been re mailed. Instructed that if labs were misunderstood when they received the results that an office visit was needed.

## 2015-09-13 ENCOUNTER — Telehealth: Payer: Self-pay | Admitting: Internal Medicine

## 2015-09-13 NOTE — Telephone Encounter (Signed)
New Message   Pt wants to know if her transmit was successful please call pt  Because if it did not she says she is not sure on how to do it

## 2015-09-13 NOTE — Telephone Encounter (Signed)
Informed pt that she does not have to send a remote transmission w/ home monitor before her November appt w/ MD. Informed her that the device techs will check her device while she is here. Pt verbalized understanding.

## 2015-09-16 ENCOUNTER — Telehealth: Payer: Self-pay | Admitting: Family Medicine

## 2015-09-16 NOTE — Telephone Encounter (Signed)
Spoke with patient and she sees Dr. Unice Cobble as her primary physician.  She does not use [place] as her pcp.

## 2015-09-27 ENCOUNTER — Ambulatory Visit (INDEPENDENT_AMBULATORY_CARE_PROVIDER_SITE_OTHER): Payer: Medicare Other | Admitting: Cardiology

## 2015-09-27 ENCOUNTER — Encounter: Payer: Self-pay | Admitting: Cardiology

## 2015-09-27 VITALS — BP 132/86 | HR 84 | Ht 66.0 in | Wt 182.5 lb

## 2015-09-27 DIAGNOSIS — I10 Essential (primary) hypertension: Secondary | ICD-10-CM | POA: Diagnosis not present

## 2015-09-27 DIAGNOSIS — E785 Hyperlipidemia, unspecified: Secondary | ICD-10-CM | POA: Diagnosis not present

## 2015-09-27 DIAGNOSIS — I25119 Atherosclerotic heart disease of native coronary artery with unspecified angina pectoris: Secondary | ICD-10-CM | POA: Diagnosis not present

## 2015-09-27 DIAGNOSIS — I480 Paroxysmal atrial fibrillation: Secondary | ICD-10-CM | POA: Diagnosis not present

## 2015-09-27 MED ORDER — ISOSORBIDE MONONITRATE ER 30 MG PO TB24: 30.0000 mg | ORAL_TABLET | Freq: Two times a day (BID) | ORAL | Status: DC

## 2015-09-27 NOTE — Patient Instructions (Signed)
We will continue Imdur 30 mg daily  Continue your other therapy  I will see you in 6 months.

## 2015-09-28 NOTE — Progress Notes (Signed)
[name] Date of Birth: [birth year] Medical Record #[id number]  History of Present Illness: Makenah is seen today for follow up CAD and SSS. She has SSS with pacemaker in place. Has had PAF, HTN, HLD and moderate 3 vessel CAD per cath back in 2011.  She was seen by Ellen Henri PA in August with some chest pain. her Imdur was increased to 60 mg daily. She states the discomfort was trivial but she told her family and they insisted she be seen. Since then she has no recurrent chest pain and she has decreased Imdur back to 30 mg daily. She denies any chest pain or palpitations. She has no further problems with vertigo.   Current Outpatient Prescriptions on File Prior to Visit  Medication Sig Dispense Refill  . benazepril (LOTENSIN) 40 MG tablet Take 0.5 tablets (20 mg total) by mouth every morning. 30 tablet 6  . dabigatran (PRADAXA) 150 MG CAPS capsule Take 1 capsule (150 mg total) by mouth 2 (two) times daily. 180 capsule 3  . metoprolol succinate (TOPROL-XL) 50 MG 24 hr tablet Take 1 tablet (50 mg total) by mouth every morning. Take with or immediately following a meal. 90 tablet 3  . nitroGLYCERIN (NITROSTAT) 0.4 MG SL tablet Place 0.4 mg under the tongue every 5 (five) minutes as needed for chest pain (MAX 3 TABLETS).     . polyethylene glycol (MIRALAX / GLYCOLAX) packet Take 17 g by mouth daily as needed (constipation).     No current facility-administered medications on file prior to visit.    Allergies  Allergen Reactions  . Cefpodoxime     Nocturia & disturbed sleep  . Codeine     nausea    Past Medical History  Diagnosis Date  . Hypertension   . Diverticulosis of colon   . Osteoarthritis   . Colonic polyp   . Sick sinus syndrome (HCC)     with paroxysmal atrial fibrillation  . Hyperlipidemia   . Dyslipidemia   . Coronary artery disease     Moderate three vessel obstructive coronary disease  . Vertigo   . Chronic kidney disease 2011    creat 1.5   . E. coli  UTI 02/2013    uniformly sensitive  . Gout     Past Surgical History  Procedure Laterality Date  . Cholecystectomy      age 53  . Abdominal hysterectomy    . Oophorectomy      bilat for fibroids @ age 75, anemia pre op due to dysfunctional menses yo  . Tonsillectomy    . Breast biopsy      x 1  . Appendectomy      done at [facility]  . Breast lumpectomy  2009    Dr Margot Chimes  . Paf induced mi, s/p pacer for brady-tach syndrome  04/2010    Dr.Awilda Covin  . Other surgical history  hysterectomy  . Cholecystectomy    . Breast biopsy    . Pacemaker insertion  2011    History  Smoking status  . Never Smoker   Smokeless tobacco  . Not on file    History  Alcohol Use No    Family History  Problem Relation Age of Onset  . Hyperlipidemia Father   . Stroke Father     TIA,CVA  . Heart failure Father   . Cancer Mother     ? renal primary  . Breast cancer Sister     11/08  . Breast cancer Maternal  Aunt   . Colon cancer Maternal Aunt   . Breast cancer Daughter     lumpectomy 2007;mastectomy 2013  . Heart disease Brother     pacer  . Diabetes Neg Hx     Review of Systems: The review of systems is per the HPI.  All other systems were reviewed and are negative.  Physical Exam: BP 132/86 mmHg  Pulse 84  Ht '5\' 6"'$  (1.676 m)  Wt 82.781 kg (182 lb 8 oz)  BMI 29.47 kg/m2 Patient is very pleasant and in no acute distress. Skin is warm and dry. Color is normal.  HEENT is unremarkable. Normocephalic/atraumatic. PERRL. Sclera are nonicteric. Neck is supple. No masses. No JVD. Lungs are clear. Cardiac exam shows a regular rate and rhythm. Abdomen is soft. Extremities are without edema. Gait and ROM are intact. No gross neurologic deficits noted.  LABORATORY DATA:  Lab Results  Component Value Date   WBC 6.1 09/01/2015   HGB 12.7 09/01/2015   HCT 39.1 09/01/2015   PLT 260.0 09/01/2015   GLUCOSE 109* 09/01/2015   CHOL 172 09/01/2015   TRIG 171.0* 09/01/2015   HDL 30.50* 09/01/2015    LDLDIRECT 123.5 09/01/2014   LDLCALC 107* 09/01/2015   ALT 14 09/01/2015   AST 12 09/01/2015   NA 140 09/01/2015   K 5.0 09/01/2015   CL 103 09/01/2015   CREATININE 1.59* 09/01/2015   BUN 21 09/01/2015   CO2 29 09/01/2015   TSH 4.01 09/01/2015   INR 1.31 06/21/2014   HGBA1C 6.2 09/01/2015   MICROALBUR 0.2 07/01/2007    Assessment / Plan:  1. HTN - well  controlled. Continue current therapy.   2. CAD - moderate 3VD per cath back in 2011 - no chest pain. Follow up in 6 months. Continue current antianginal therapy.  3. HLD - on fish oil only. History of intolerance to statins.  4. SSS/PAF - with pacemaker in place - followed by Dr. Lovena Le - managed with rate control and on chronic Pradaxa. Pacemaker check in June was satisfactory with mode switches  0.6%.

## 2015-10-04 ENCOUNTER — Encounter: Payer: Self-pay | Admitting: Internal Medicine

## 2015-10-04 ENCOUNTER — Ambulatory Visit (INDEPENDENT_AMBULATORY_CARE_PROVIDER_SITE_OTHER): Payer: Medicare Other | Admitting: Internal Medicine

## 2015-10-04 VITALS — BP 102/56 | HR 82 | Ht 66.0 in | Wt 182.2 lb

## 2015-10-04 DIAGNOSIS — I495 Sick sinus syndrome: Secondary | ICD-10-CM

## 2015-10-04 DIAGNOSIS — I10 Essential (primary) hypertension: Secondary | ICD-10-CM

## 2015-10-04 DIAGNOSIS — I2510 Atherosclerotic heart disease of native coronary artery without angina pectoris: Secondary | ICD-10-CM | POA: Diagnosis not present

## 2015-10-04 DIAGNOSIS — Z950 Presence of cardiac pacemaker: Secondary | ICD-10-CM | POA: Diagnosis not present

## 2015-10-04 DIAGNOSIS — I2583 Coronary atherosclerosis due to lipid rich plaque: Secondary | ICD-10-CM

## 2015-10-04 DIAGNOSIS — I480 Paroxysmal atrial fibrillation: Secondary | ICD-10-CM

## 2015-10-04 LAB — CUP PACEART INCLINIC DEVICE CHECK
Battery Impedance: 274 Ohm
Battery Voltage: 2.79 V
Brady Statistic AP VP Percent: 3 %
Brady Statistic AP VS Percent: 92 %
Brady Statistic AS VS Percent: 4 %
Implantable Lead Implant Date: 20110608
Implantable Lead Location: 753859
Implantable Lead Location: 753860
Implantable Lead Model: 4471
Implantable Lead Serial Number: 480470
Implantable Lead Serial Number: 672291
Lead Channel Impedance Value: 398 Ohm
Lead Channel Impedance Value: 443 Ohm
Lead Channel Pacing Threshold Amplitude: 0.5 V
Lead Channel Pacing Threshold Amplitude: 0.75 V
Lead Channel Pacing Threshold Pulse Width: 0.4 ms
Lead Channel Sensing Intrinsic Amplitude: 4 mV
Lead Channel Sensing Intrinsic Amplitude: 5.6 mV
Lead Channel Setting Pacing Pulse Width: 0.4 ms
Lead Channel Setting Sensing Sensitivity: 2.8 mV
MDC IDC LEAD IMPLANT DT: 20110608
MDC IDC LEAD MODEL: 4470
MDC IDC MSMT BATTERY REMAINING LONGEVITY: 106 mo
MDC IDC MSMT LEADCHNL RV PACING THRESHOLD PULSEWIDTH: 0.4 ms
MDC IDC SESS DTM: 20161115091318
MDC IDC SET LEADCHNL RA PACING AMPLITUDE: 2 V
MDC IDC SET LEADCHNL RV PACING AMPLITUDE: 2.5 V
MDC IDC STAT BRADY AS VP PERCENT: 0 %

## 2015-10-04 NOTE — Assessment & Plan Note (Signed)
Her Medtronic PPM is working normally. Will recheck in several months.

## 2015-10-04 NOTE — Patient Instructions (Signed)
Medication Instructions:  Your physician recommends that you continue on your current medications as directed. Please refer to the Current Medication list given to you today.  Labwork: None ordered  Testing/Procedures: None ordered  Follow-Up: Remote monitoring is used to monitor your Pacemaker of ICD from home. This monitoring reduces the number of office visits required to check your device to one time per year. It allows [place] to keep an eye on the functioning of your device to ensure it is working properly. You are scheduled for a device check from home on 01/03/16. You may send your transmission at any time that day. If you have a wireless device, the transmission will be sent automatically. After your physician reviews your transmission, you will receive a postcard with your next transmission date.  Your physician wants you to follow-up in: 1 year with Dr. Lovena Le. You will receive a reminder letter in the mail two months in advance. If you don't receive a letter, please call our office to schedule the follow-up appointment.   If you need a refill on your cardiac medications before your next appointment, please call your pharmacy.  Thank you for choosing [facility]!!

## 2015-10-04 NOTE — Progress Notes (Signed)
HPI Brooke Rios returns today for followup. She is a very pleasant 81 -year-old woman, with a history of symptomatic bradycardia,  hypertension, status post permanent pacemaker insertion. In the interim, the patient has been stable. She admits to occasional dietary indiscretion. She denies chest pain, shortness of breath, or peripheral edema. No syncope.  No palpitations. She has not had any additional vertigo. She admits to dietary indiscretion.  Allergies  Allergen Reactions  . Cefpodoxime Other (See Comments)    Nocturia & disturbed sleep  . Codeine     nausea     Current Outpatient Prescriptions  Medication Sig Dispense Refill  . benazepril (LOTENSIN) 40 MG tablet Take 0.5 tablets (20 mg total) by mouth every morning. 30 tablet 6  . dabigatran (PRADAXA) 150 MG CAPS capsule Take 1 capsule (150 mg total) by mouth 2 (two) times daily. 180 capsule 3  . isosorbide mononitrate (IMDUR) 30 MG 24 hr tablet Take 1 tablet (30 mg total) by mouth 2 (two) times daily. Take 1 tab twice daily 90 tablet 3  . metoprolol succinate (TOPROL-XL) 50 MG 24 hr tablet Take 1 tablet (50 mg total) by mouth every morning. Take with or immediately following a meal. 90 tablet 3  . nitroGLYCERIN (NITROSTAT) 0.4 MG SL tablet Place 0.4 mg under the tongue every 5 (five) minutes as needed for chest pain (MAX 3 TABLETS).     . polyethylene glycol (MIRALAX / GLYCOLAX) packet Take 17 g by mouth daily as needed (constipation).     No current facility-administered medications for this visit.     Past Medical History  Diagnosis Date  . Hypertension   . Diverticulosis of colon   . Osteoarthritis   . Colonic polyp   . Sick sinus syndrome (HCC)     with paroxysmal atrial fibrillation  . Hyperlipidemia   . Dyslipidemia   . Coronary artery disease     Moderate three vessel obstructive coronary disease  . Vertigo   . Chronic kidney disease 2011    creat 1.5   . E. coli UTI 02/2013    uniformly sensitive  . Gout      ROS:   All systems reviewed and negative except as noted in the HPI.   Past Surgical History  Procedure Laterality Date  . Cholecystectomy      age 51  . Abdominal hysterectomy    . Oophorectomy      bilat for fibroids @ age 62, anemia pre op due to dysfunctional menses yo  . Tonsillectomy    . Breast biopsy      x 1  . Appendectomy      done at [facility]  . Breast lumpectomy  2009    Dr Margot Chimes  . Paf induced mi, s/p pacer for brady-tach syndrome  04/2010    Dr.jordan  . Other surgical history  hysterectomy  . Cholecystectomy    . Breast biopsy    . Pacemaker insertion  2011     Family History  Problem Relation Age of Onset  . Hyperlipidemia Father   . Stroke Father     TIA,CVA  . Heart failure Father   . Cancer Mother     ? renal primary  . Breast cancer Sister     11/08  . Breast cancer Maternal Aunt   . Colon cancer Maternal Aunt   . Breast cancer Daughter     lumpectomy 2007;mastectomy 2013  . Heart disease Brother     pacer  . Diabetes Neg  Hx      Social History   Social History  . Marital Status: Widowed    Spouse Name: N/A  . Number of Children: N/A  . Years of Education: N/A   Occupational History  . Not on file.   Social History Main Topics  . Smoking status: Never Smoker   . Smokeless tobacco: Not on file  . Alcohol Use: No  . Drug Use: No  . Sexual Activity: Not Currently   Other Topics Concern  . Not on file   Social History Narrative   Regular exercise- no      BP 102/56 mmHg  Pulse 82  Ht '5\' 6"'$  (1.676 m)  Wt 182 lb 3.2 oz (82.645 kg)  BMI 29.42 kg/m2  SpO2 98%  Physical Exam:  Well appearing elderly woman, NAD HEENT: Unremarkable Neck:  No JVD, no thyromegally Lungs:  Clear with no wheezes, rales, or rhonchi. HEART:  Regular rate rhythm, no murmurs, no rubs, no clicks Abd:  soft, positive bowel sounds, no organomegally, no rebound, no guarding Ext:  2 plus pulses, no edema, no cyanosis, no clubbing Skin:  No rashes  no nodules Neuro:  CN II through XII intact, motor grossly intact  DEVICE  Normal device function.  See PaceArt for details.   Assess/Plan:

## 2015-10-04 NOTE — Assessment & Plan Note (Signed)
She denies anginal symptoms. Will follow.  

## 2015-10-04 NOTE — Assessment & Plan Note (Signed)
Her blood pressure is well controlled. No change in her meds. 

## 2015-10-04 NOTE — Assessment & Plan Note (Signed)
She is maintaining NSR 99% of the time. Will follow.

## 2015-11-07 ENCOUNTER — Other Ambulatory Visit: Payer: Self-pay | Admitting: Cardiology

## 2015-11-07 NOTE — Telephone Encounter (Signed)
°*  STAT* If patient is at the pharmacy, call can be transferred to refill team.   1. Which medications need to be refilled? (please list name of each medication and dose if known) Nitrostat   2. Which pharmacy/location (including street and city if local pharmacy) [facility] [address]  3. Do they need a 30 day or 90 day supply? [place]

## 2015-11-08 MED ORDER — NITROGLYCERIN 0.4 MG SL SUBL: 0.4000 mg | SUBLINGUAL_TABLET | SUBLINGUAL | Status: AC | PRN

## 2015-11-08 NOTE — Telephone Encounter (Signed)
ntg sl was send into pt pharmacy

## 2015-11-10 DIAGNOSIS — L309 Dermatitis, unspecified: Secondary | ICD-10-CM | POA: Diagnosis not present

## 2015-11-10 DIAGNOSIS — L218 Other seborrheic dermatitis: Secondary | ICD-10-CM | POA: Diagnosis not present

## 2015-11-28 ENCOUNTER — Other Ambulatory Visit: Payer: Self-pay | Admitting: Cardiology

## 2015-11-28 MED ORDER — BENAZEPRIL HCL 40 MG PO TABS: 20.0000 mg | ORAL_TABLET | Freq: Every morning | ORAL | Status: DC

## 2015-11-30 DIAGNOSIS — L309 Dermatitis, unspecified: Secondary | ICD-10-CM | POA: Diagnosis not present

## 2015-12-23 DIAGNOSIS — H1132 Conjunctival hemorrhage, left eye: Secondary | ICD-10-CM | POA: Diagnosis not present

## 2015-12-30 DIAGNOSIS — L958 Other vasculitis limited to the skin: Secondary | ICD-10-CM | POA: Diagnosis not present

## 2015-12-30 DIAGNOSIS — L218 Other seborrheic dermatitis: Secondary | ICD-10-CM | POA: Diagnosis not present

## 2015-12-30 DIAGNOSIS — D170 Benign lipomatous neoplasm of skin and subcutaneous tissue of head, face and neck: Secondary | ICD-10-CM | POA: Diagnosis not present

## 2015-12-30 DIAGNOSIS — L308 Other specified dermatitis: Secondary | ICD-10-CM | POA: Diagnosis not present

## 2016-01-03 ENCOUNTER — Encounter: Payer: Medicare Other | Admitting: *Deleted

## 2016-01-03 ENCOUNTER — Telehealth: Payer: Self-pay | Admitting: Cardiology

## 2016-01-03 NOTE — Telephone Encounter (Signed)
LMOVM reminding pt to send remote transmission.   

## 2016-01-04 ENCOUNTER — Encounter: Payer: Self-pay | Admitting: Cardiology

## 2016-01-12 DIAGNOSIS — Z79899 Other long term (current) drug therapy: Secondary | ICD-10-CM | POA: Diagnosis not present

## 2016-01-12 DIAGNOSIS — L298 Other pruritus: Secondary | ICD-10-CM | POA: Diagnosis not present

## 2016-01-12 DIAGNOSIS — M310 Hypersensitivity angiitis: Secondary | ICD-10-CM | POA: Diagnosis not present

## 2016-01-13 ENCOUNTER — Ambulatory Visit (INDEPENDENT_AMBULATORY_CARE_PROVIDER_SITE_OTHER): Payer: Medicare Other | Admitting: *Deleted

## 2016-01-13 DIAGNOSIS — I495 Sick sinus syndrome: Secondary | ICD-10-CM

## 2016-01-13 NOTE — Progress Notes (Signed)
Remote pacemaker transmission.   

## 2016-01-31 ENCOUNTER — Telehealth: Payer: Self-pay

## 2016-01-31 NOTE — Telephone Encounter (Signed)
Pre-Visit Call completed.

## 2016-02-01 ENCOUNTER — Ambulatory Visit (INDEPENDENT_AMBULATORY_CARE_PROVIDER_SITE_OTHER): Payer: Medicare Other | Admitting: Family Medicine

## 2016-02-01 ENCOUNTER — Encounter: Payer: Self-pay | Admitting: Family Medicine

## 2016-02-01 VITALS — BP 110/70 | HR 83 | Ht 66.0 in | Wt 177.6 lb

## 2016-02-01 DIAGNOSIS — Z780 Asymptomatic menopausal state: Secondary | ICD-10-CM | POA: Diagnosis not present

## 2016-02-01 DIAGNOSIS — D6489 Other specified anemias: Secondary | ICD-10-CM

## 2016-02-01 DIAGNOSIS — Z0000 Encounter for general adult medical examination without abnormal findings: Secondary | ICD-10-CM

## 2016-02-01 LAB — CBC WITH DIFFERENTIAL/PLATELET
BASOS PCT: 0.2 % (ref 0.0–3.0)
Basophils Absolute: 0 10*3/uL (ref 0.0–0.1)
EOS ABS: 0.1 10*3/uL (ref 0.0–0.7)
Eosinophils Relative: 0.5 % (ref 0.0–5.0)
HCT: 34.5 % — ABNORMAL LOW (ref 36.0–46.0)
Hemoglobin: 10.6 g/dL — ABNORMAL LOW (ref 12.0–15.0)
LYMPHS ABS: 0.7 10*3/uL (ref 0.7–4.0)
LYMPHS PCT: 7 % — AB (ref 12.0–46.0)
MCHC: 30.8 g/dL (ref 30.0–36.0)
MCV: 78.2 fl (ref 78.0–100.0)
MONO ABS: 0.2 10*3/uL (ref 0.1–1.0)
Monocytes Relative: 2.4 % — ABNORMAL LOW (ref 3.0–12.0)
NEUTROS ABS: 9.3 10*3/uL — AB (ref 1.4–7.7)
PLATELETS: 331 10*3/uL (ref 150.0–400.0)
RBC: 4.41 Mil/uL (ref 3.87–5.11)
RDW: 16.6 % — AB (ref 11.5–15.5)
WBC: 10.3 10*3/uL (ref 4.0–10.5)

## 2016-02-01 NOTE — Progress Notes (Addendum)
[place] [facility] [address], [place], [place] [address] [phone]  Date:  02/01/2016   Name:  Brooke Rios   DOB:  02-03-1925   MRN:  [id number]  PCP:  Unice Cobble, MD    Chief Complaint: Annual Exam   History of Present Illness:  Brooke Rios is a 82 y.o. very pleasant female patient who presents with the following:  History of HTN, sick sinus/ pacer placed 2011, MI, CKD, hyperlipidemia, CAD, gout here today for a CPE Cardiologist is Dr. Lovena Le for her pacer, Dr. Martinique for her general cardiology  She does not see nephrology Labs done in October of 2016 Flu shot is UTD She sees Dr. Derrel Nip for her derm concerns.  She is being treated for itching and was noted to be anemic during her evaluation  She has never been a smoker.   She is a native [place] resident.  She did work for the [employer] for 30 years, she retired about 30 years ago.   She is widowed and has 2 daughters.  She has 4 grands and 2 great-grandsons (15 and 39 yo) She lives on her own, she drives. 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  She is active with friends, church, and her family.  She has some sort of activity really every day of the week.  She tries to get out and walk  Patient Active Problem List   Diagnosis Date Noted  . Elevated TSH 08/19/2015  . Vertigo 06/21/2014  . Vomiting 06/21/2014  . History of anemia 09/08/2013  . History of gout 05/22/2013  . Pacemaker-Medtronic 09/10/2012  . Unspecified adverse effect of unspecified drug, medicinal and biological substance 06/12/2012  . Hyperglycemia 06/12/2012  . Tachy-brady syndrome ([place]) 02/05/2012  . Atrial fibrillation ([place]) 09/11/2011  . Coronary artery disease   . HIP PAIN 10/17/2010  . DIZZINESS 11/22/2009  . RENAL DISEASE, CHRONIC, MILD 06/08/2009  . BENIGN PAROXYSMAL POSITIONAL VERTIGO 12/30/2008  . BRADYCARDIA, CHRONIC 12/30/2008  . DIVERTICULOSIS,  COLON 12/30/2008  . OSTEOARTHRITIS 12/30/2008  . COLONIC POLYPS, HX OF 12/30/2008  . Hyperlipidemia 12/24/2007  . Thrombocytopenia ([place]) 12/24/2007  . Essential hypertension 12/24/2007  . FIBROCYSTIC BREAST DISEASE 12/24/2007    Past Medical History  Diagnosis Date  . Hypertension   . Diverticulosis of colon   . Osteoarthritis   . Colonic polyp   . Sick sinus syndrome (HCC)     with paroxysmal atrial fibrillation  . Hyperlipidemia   . Dyslipidemia   . Coronary artery disease     Moderate three vessel obstructive coronary disease  . Vertigo   . Chronic kidney disease 2011    creat 1.5   . E. coli UTI 02/2013    uniformly sensitive  . Gout     Past Surgical History  Procedure Laterality Date  . Cholecystectomy      age 62  . Abdominal hysterectomy    . Oophorectomy      bilat for fibroids @ age 64, anemia pre op due to dysfunctional menses yo  . Tonsillectomy    . Breast biopsy      x 1  . Appendectomy      done at [facility]  . Breast lumpectomy  2009    Dr Margot Chimes  . Paf induced mi, s/p pacer for brady-tach syndrome  04/2010    Dr.jordan  . Other surgical history  hysterectomy  . Cholecystectomy    .  Breast biopsy    . Pacemaker insertion  2011    Social History  Substance Use Topics  . Smoking status: Never Smoker   . Smokeless tobacco: None  . Alcohol Use: No    Family History  Problem Relation Age of Onset  . Hyperlipidemia Father   . Stroke Father     TIA,CVA  . Heart failure Father   . Cancer Mother     ? renal primary  . Breast cancer Sister     11/08  . Breast cancer Maternal Aunt   . Colon cancer Maternal Aunt   . Breast cancer Daughter     lumpectomy 2007;mastectomy 2013  . Heart disease Brother     pacer  . Diabetes Neg Hx     Allergies  Allergen Reactions  . Cefpodoxime Other (See Comments)    Nocturia & disturbed sleep  . Codeine     nausea    Medication list has been reviewed and updated.  Current Outpatient Prescriptions on  File Prior to Visit  Medication Sig Dispense Refill  . benazepril (LOTENSIN) 40 MG tablet Take 0.5 tablets (20 mg total) by mouth every morning. 90 tablet 1  . dabigatran (PRADAXA) 150 MG CAPS capsule Take 1 capsule (150 mg total) by mouth 2 (two) times daily. 180 capsule 3  . isosorbide mononitrate (IMDUR) 30 MG 24 hr tablet Take 1 tablet (30 mg total) by mouth 2 (two) times daily. Take 1 tab twice daily 90 tablet 3  . metoprolol succinate (TOPROL-XL) 50 MG 24 hr tablet Take 1 tablet (50 mg total) by mouth every morning. Take with or immediately following a meal. 90 tablet 3  . nitroGLYCERIN (NITROSTAT) 0.4 MG SL tablet Place 1 tablet (0.4 mg total) under the tongue every 5 (five) minutes as needed for chest pain (MAX 3 TABLETS). 25 tablet 1  . polyethylene glycol (MIRALAX / GLYCOLAX) packet Take 17 g by mouth daily as needed (constipation).     No current facility-administered medications on file prior to visit.    Review of Systems:  As per HPI- otherwise negative.   Physical Examination: Filed Vitals:   02/01/16 1305  BP: 110/70  Pulse: 83   Filed Vitals:   02/01/16 1305  Height: '5\' 6"'$  (1.676 m)  Weight: 177 lb 9.6 oz (80.559 kg)   Body mass index is 28.68 kg/(m^2). Ideal Body Weight: Weight in (lb) to have BMI = 25: 154.6  GEN: WDWN, NAD, Non-toxic, A & O x 3, well appearing elderly lady HEENT: Atraumatic, Normocephalic. Neck supple. No masses, No LAD.  Bilateral TM wnl, oropharynx normal.  PEERL,EOMI.   Ears and Nose: No external deformity. CV: RRR, No M/G/R. No JVD. No thrill. No extra heart sounds. PULM: CTA B, no wheezes, crackles, rhonchi. No retractions. No resp. distress. No accessory muscle use. ABD: S, NT, ND, +BS. No rebound. No HSM. EXTR: No c/c/e NEURO Normal gait.  PSYCH: Normally interactive. Conversant. Not depressed or anxious appearing.  Calm demeanor.  Breast: normal exam, no masses/ dimpling/ discharge   Assessment and Plan: Physical exam  Anemia  due to other cause - Plan: CBC with Differential/Platelet  Post-menopausal - Plan: DG Bone Density  Will recheck her CBC today to confirm anemia Will have her set up a bone density test when she has her next mammogram  Will plan further follow- up pending labs.   Signed Lamar Blinks, MD  Received her labs and called her- in 10/16 her hg was 12.7.  Will  send her stool tests for blood and will ask the lab to add on a ferritin on Monday.  She has not noted any blood in her stools  Results for orders placed or performed in visit on 02/01/16  CBC with Differential/Platelet  Result Value Ref Range   WBC 10.3 4.0 - 10.5 K/uL   RBC 4.41 3.87 - 5.11 Mil/uL   Hemoglobin 10.6 (L) 12.0 - 15.0 g/dL   HCT 34.5 (L) 36.0 - 46.0 %   MCV 78.2 78.0 - 100.0 fl   MCHC 30.8 30.0 - 36.0 g/dL   RDW 16.6 (H) 11.5 - 15.5 %   Platelets 331.0 150.0 - 400.0 K/uL   Neutrophils Relative % 89.9 Repeated and verified X2. (H) 43.0 - 77.0 %   Lymphocytes Relative 7.0 (L) 12.0 - 46.0 %   Monocytes Relative 2.4 (L) 3.0 - 12.0 %   Eosinophils Relative 0.5 0.0 - 5.0 %   Basophils Relative 0.2 0.0 - 3.0 %   Neutro Abs 9.3 (H) 1.4 - 7.7 K/uL   Lymphs Abs 0.7 0.7 - 4.0 K/uL   Monocytes Absolute 0.2 0.1 - 1.0 K/uL   Eosinophils Absolute 0.1 0.0 - 0.7 K/uL   Basophils Absolute 0.0 0.0 - 0.1 K/uL

## 2016-02-01 NOTE — Patient Instructions (Addendum)
It was great to meet you today! Your most recent hemoglobin from the dermatology office was 10.7.  I am going to recheck this for you today and will be in touch asap If you would, please ask for a record of your immunizations at the hospital volunteer office.   If you have not already had a pneumonia vaccine I will do this for you!    I placed an order for a bone density scan- you can have this done also at [place] when you have your next mammogram!   When you schedule the mammogram schedule a bone density as well

## 2016-02-06 ENCOUNTER — Encounter: Payer: Self-pay | Admitting: Family Medicine

## 2016-02-06 ENCOUNTER — Telehealth: Payer: Self-pay | Admitting: Emergency Medicine

## 2016-02-06 ENCOUNTER — Other Ambulatory Visit: Payer: Self-pay | Admitting: Emergency Medicine

## 2016-02-06 ENCOUNTER — Other Ambulatory Visit (INDEPENDENT_AMBULATORY_CARE_PROVIDER_SITE_OTHER): Payer: Medicare Other

## 2016-02-06 DIAGNOSIS — Z862 Personal history of diseases of the blood and blood-forming organs and certain disorders involving the immune mechanism: Secondary | ICD-10-CM

## 2016-02-06 DIAGNOSIS — D6489 Other specified anemias: Secondary | ICD-10-CM

## 2016-02-06 LAB — FERRITIN: Ferritin: 135.4 ng/mL (ref 10.0–291.0)

## 2016-02-06 NOTE — Telephone Encounter (Signed)
Patient did RTC and understands Labs will be drawn(Ferritin) and to pick up Stool cards, pt is going to [facility] today. Orders are in [place].Marland KitchenMarland KitchenKMP

## 2016-02-06 NOTE — Telephone Encounter (Signed)
LM for patient to RTC. Patient needs to make a lab appt to have Ferritin level drawn and pick up Stool cards per Dr.Copland......KMP

## 2016-02-07 ENCOUNTER — Telehealth: Payer: Self-pay

## 2016-02-07 LAB — CUP PACEART REMOTE DEVICE CHECK
Battery Voltage: 2.79 V
Brady Statistic AP VP Percent: 3 %
Brady Statistic AS VP Percent: 0 %
Date Time Interrogation Session: 20170224141338
Implantable Lead Implant Date: 20110608
Implantable Lead Model: 4470
Implantable Lead Model: 4471
Lead Channel Pacing Threshold Amplitude: 0.5 V
Lead Channel Pacing Threshold Pulse Width: 0.4 ms
Lead Channel Setting Pacing Amplitude: 2.5 V
MDC IDC LEAD IMPLANT DT: 20110608
MDC IDC LEAD LOCATION: 753859
MDC IDC LEAD LOCATION: 753860
MDC IDC LEAD SERIAL: 480470
MDC IDC LEAD SERIAL: 672291
MDC IDC MSMT BATTERY IMPEDANCE: 298 Ohm
MDC IDC MSMT BATTERY REMAINING LONGEVITY: 103 mo
MDC IDC MSMT LEADCHNL RA IMPEDANCE VALUE: 397 Ohm
MDC IDC MSMT LEADCHNL RV IMPEDANCE VALUE: 513 Ohm
MDC IDC MSMT LEADCHNL RV PACING THRESHOLD AMPLITUDE: 0.625 V
MDC IDC MSMT LEADCHNL RV PACING THRESHOLD PULSEWIDTH: 0.4 ms
MDC IDC MSMT LEADCHNL RV SENSING INTR AMPL: 5.6 mV
MDC IDC SET LEADCHNL RA PACING AMPLITUDE: 2 V
MDC IDC SET LEADCHNL RV PACING PULSEWIDTH: 0.4 ms
MDC IDC SET LEADCHNL RV SENSING SENSITIVITY: 4 mV
MDC IDC STAT BRADY AP VS PERCENT: 90 %
MDC IDC STAT BRADY AS VS PERCENT: 7 %

## 2016-02-07 NOTE — Telephone Encounter (Signed)
Prior auth for Pradaxa'150mg'$  [facility].

## 2016-02-08 ENCOUNTER — Telehealth: Payer: Self-pay

## 2016-02-08 ENCOUNTER — Encounter: Payer: Self-pay | Admitting: Cardiology

## 2016-02-08 ENCOUNTER — Telehealth: Payer: Self-pay | Admitting: *Deleted

## 2016-02-08 DIAGNOSIS — I10 Essential (primary) hypertension: Secondary | ICD-10-CM

## 2016-02-08 NOTE — Telephone Encounter (Signed)
[facility] has denied coverage of Pradaxa. Their preferred drugs are Eliquis, Xarelto or Warfarin. I will inform patient of this.

## 2016-02-08 NOTE — Telephone Encounter (Signed)
Pradaxa denied by [facility]. Patient notified. She has taken her last pill this am. I have sent a message to Dr. Martinique and Elly Modena, with alternatives

## 2016-02-08 NOTE — Telephone Encounter (Signed)
[facility] to speak to a [facility] - giving notification that the patient requires a prior authorization for Pradaxa.  Insurance we have on file is up-to-date. She will need this done through her [employer] benefits.

## 2016-02-08 NOTE — Telephone Encounter (Signed)
If insurance will not cover Pradaxa we could switch her to Eliquis. We would need to update BMET- she has CKD and her dose would need to be decided based on lab.  Myya Meenach Martinique MD, [facility]

## 2016-02-09 ENCOUNTER — Telehealth: Payer: Self-pay | Admitting: Cardiology

## 2016-02-09 DIAGNOSIS — I10 Essential (primary) hypertension: Secondary | ICD-10-CM | POA: Diagnosis not present

## 2016-02-09 NOTE — Telephone Encounter (Signed)
See previous 02/09/16 note.

## 2016-02-09 NOTE — Telephone Encounter (Signed)
Returned call to patient.Dr.Jordan advised ok to stop pradaxa.Will need Bmet then he will prescribe eliquis.Stated she wanted Dr.Jordan to know she decreased pradaxa 150 mg to once a day a long time ago more than 6 months ago.Stated she wiped blood from her nose several times and decided to decrease.Stated she is out of pradaxa.Patient will come by the office this afternoon and pick up samples of Praxada 150 mg.Advised she will need to take 150 mg twice a day.She will have bmet this afternoon.Advised I will call back to let her know about starting eliquis.

## 2016-02-09 NOTE — Telephone Encounter (Signed)
Pt is calling back in because she is out of Pradaxa and would like to know if there is going to be another medication she can take. Please f/u with her  Thanks

## 2016-02-09 NOTE — Addendum Note (Signed)
Addended by: Kathyrn Lass on: 02/09/2016 04:09 PM   Modules accepted: Orders

## 2016-02-10 LAB — BASIC METABOLIC PANEL
BUN: 37 mg/dL — AB (ref 7–25)
CALCIUM: 8.8 mg/dL (ref 8.6–10.4)
CO2: 24 mmol/L (ref 20–31)
Chloride: 100 mmol/L (ref 98–110)
Creat: 1.4 mg/dL — ABNORMAL HIGH (ref 0.60–0.88)
GLUCOSE: 138 mg/dL — AB (ref 65–99)
POTASSIUM: 5 mmol/L (ref 3.5–5.3)
Sodium: 138 mmol/L (ref 135–146)

## 2016-02-13 ENCOUNTER — Other Ambulatory Visit: Payer: Medicare Other

## 2016-02-14 ENCOUNTER — Other Ambulatory Visit (INDEPENDENT_AMBULATORY_CARE_PROVIDER_SITE_OTHER): Payer: Medicare Other

## 2016-02-14 DIAGNOSIS — Z780 Asymptomatic menopausal state: Secondary | ICD-10-CM

## 2016-02-14 DIAGNOSIS — Z0000 Encounter for general adult medical examination without abnormal findings: Secondary | ICD-10-CM

## 2016-02-14 DIAGNOSIS — D6489 Other specified anemias: Secondary | ICD-10-CM | POA: Diagnosis not present

## 2016-02-14 NOTE — Addendum Note (Signed)
Addended by: Peggyann Shoals on: 02/14/2016 01:28 PM   Modules accepted: Orders

## 2016-02-15 LAB — FECAL OCCULT BLOOD, IMMUNOCHEMICAL: FECAL OCCULT BLD: POSITIVE — AB

## 2016-02-16 NOTE — Progress Notes (Unsigned)
Noted to have new onset of anemia, so we added on a ferritin and stool occult blood.  Called and tried to [facility] at home but I am not sure if it worked. Will try her again   Lab Results  Component Value Date   WBC 10.3 02/01/2016   HGB 10.6* 02/01/2016   HCT 34.5* 02/01/2016   MCV 78.2 02/01/2016   PLT 331.0 02/01/2016   Lab Results  Component Value Date   OCCULTBLD Positive* 02/14/2016   OCCULTBLD negative 01/18/2009   OCCULTBLD negative 01/18/2009   OCCULTBLD negative 01/18/2009

## 2016-02-17 ENCOUNTER — Telehealth: Payer: Self-pay | Admitting: Cardiology

## 2016-02-17 MED ORDER — APIXABAN 2.5 MG PO TABS: 2.5000 mg | ORAL_TABLET | Freq: Two times a day (BID) | ORAL | Status: DC

## 2016-02-17 NOTE — Telephone Encounter (Signed)
FORWARD TO CHERYL PUGH LPN NEED TO ASK DR Martinique  WHETHER PATIENT NEEDS ELIQUIS 2.5 OR 5 MG

## 2016-02-17 NOTE — Telephone Encounter (Signed)
Returned call to patient.Dr.Jordan advised stop pradaxa.Start eliquis 2.5 mg twice a day.

## 2016-02-17 NOTE — Telephone Encounter (Signed)
Returned call to patient.Dr.Jordan advised stop pradaxa start eliquis 2.5 mg twice a day.  Patient complaining of itching severely from head down to buttocks.Stated she has been itching since 10/2015.Stated she is going to a dermatologist and she has did everything from cream to a having had a biopsy.Stated this week she was told to take benadryl 25 mg every night.Stated she is afraid to take benadryl.Stated she wanted me to ask Dr.Jordan his advice.   Spoke to [place] he advised take claritin 10 mg every day.Advised if not better she needs to call dermatologist.

## 2016-02-17 NOTE — Telephone Encounter (Signed)
New message      Pt is almost out of pradaxa.  She said Dr Martinique was going to switch her to something else because her insurance will no longer pay for pradaxa.  [facility] [address] and call pt back to let her know what he decided to do.

## 2016-02-20 ENCOUNTER — Telehealth: Payer: Self-pay | Admitting: *Deleted

## 2016-02-20 ENCOUNTER — Telehealth: Payer: Self-pay | Admitting: Family Medicine

## 2016-02-20 ENCOUNTER — Encounter: Payer: Self-pay | Admitting: Family Medicine

## 2016-02-20 DIAGNOSIS — R195 Other fecal abnormalities: Secondary | ICD-10-CM

## 2016-02-20 NOTE — Telephone Encounter (Signed)
Calling to verify with patient about taking Eliquis and not Pradaxa [facility]. Ms. Mincer will start taking Eliquis once she has finished her last pills of Pradaxa which was said to be ok by Dr. Martinique last week. Ms. Tatro [facility] and said the confusion was [facility] adn [facility] sent [facility] question a week [facility].  Ms. Kehres also wanted Dr. Martinique and Malachy Mood to know herPCP did a stool test and found a small amount of blood in her stool. And will send patient to GI. Notes are in a previous telephone note from Dr. Lorelei Pont.  Will forward to both Dr. Martinique and Malachy Mood per patient request.

## 2016-02-20 NOTE — Telephone Encounter (Signed)
Agree to stop Pradaxa and start Eliquis.  Peter Martinique MD, [facility]

## 2016-02-20 NOTE — Telephone Encounter (Signed)
Called and LMOM- She has developed anemia over the last 6 months and her stool is positive for blood.  Do not see any recent GI visit.  Although she is older she is very healthy and active so I will refer to GI for possible testing.

## 2016-02-21 ENCOUNTER — Other Ambulatory Visit: Payer: Self-pay | Admitting: *Deleted

## 2016-02-21 NOTE — Telephone Encounter (Signed)
Spoke to patient she stated she is going to finish pradaxa then she will start eliquis 2.5 mg twice a day.

## 2016-02-21 NOTE — Telephone Encounter (Signed)
Spoke to patient she will finish pradaxa then start eliquis 2.5 mg twice a day.

## 2016-03-06 ENCOUNTER — Encounter: Payer: Self-pay | Admitting: Gastroenterology

## 2016-03-08 DIAGNOSIS — L958 Other vasculitis limited to the skin: Secondary | ICD-10-CM | POA: Diagnosis not present

## 2016-03-09 ENCOUNTER — Ambulatory Visit: Payer: Medicare Other | Admitting: Family Medicine

## 2016-03-09 ENCOUNTER — Telehealth: Payer: Self-pay | Admitting: Family Medicine

## 2016-03-09 NOTE — Telephone Encounter (Signed)
No charge. 

## 2016-03-09 NOTE — Telephone Encounter (Signed)
Patient LVM at 8:28am today cancelling her 11:15am appointment, patient states she feels better. Charge or no charge

## 2016-03-13 ENCOUNTER — Other Ambulatory Visit: Payer: Self-pay | Admitting: Family Medicine

## 2016-03-13 ENCOUNTER — Other Ambulatory Visit: Payer: Self-pay

## 2016-03-13 DIAGNOSIS — E2839 Other primary ovarian failure: Secondary | ICD-10-CM

## 2016-03-13 DIAGNOSIS — Z1231 Encounter for screening mammogram for malignant neoplasm of breast: Secondary | ICD-10-CM

## 2016-03-20 ENCOUNTER — Telehealth: Payer: Self-pay | Admitting: Cardiology

## 2016-03-20 NOTE — Telephone Encounter (Signed)
Returned call to patient's daughter [name].She stated patient had a episode while driving home this past Saturday in which she felt very sleepy.Stated she felt faint.She did not black out.Stated she has been feeling very weak,no energy.No sob.No chest pain.Has c/o of a dry cough.While talking to daughter she mentioned her dermatologist started her on Vistaril for a rash, itching she has had on her back.Advised to stop Vistaril.Stated she only took 1 dose.Appointment moved up with Dr.Jordan to 03/26/16 at 2:00 pm.Advised to go to ER if she feels faint again.

## 2016-03-20 NOTE — Telephone Encounter (Signed)
Returned call to patient's daughter [name] no answer.[place].

## 2016-03-20 NOTE — Telephone Encounter (Signed)
Pt's daughter called in wanting to speak with to Malachy Mood about an episode she had last Saturday. She says the pt was driving home and all of a sudden she got really tired and felt like she was going to faint. Nothing similar has happened since but she gets really tired midday and needs to rest. Please f/u with her  Thanks

## 2016-03-26 ENCOUNTER — Ambulatory Visit (INDEPENDENT_AMBULATORY_CARE_PROVIDER_SITE_OTHER): Payer: Medicare Other | Admitting: Cardiology

## 2016-03-26 ENCOUNTER — Encounter: Payer: Self-pay | Admitting: Cardiology

## 2016-03-26 VITALS — BP 116/56 | HR 66 | Ht 66.0 in | Wt 180.1 lb

## 2016-03-26 DIAGNOSIS — I2583 Coronary atherosclerosis due to lipid rich plaque: Principal | ICD-10-CM

## 2016-03-26 DIAGNOSIS — Z950 Presence of cardiac pacemaker: Secondary | ICD-10-CM | POA: Diagnosis not present

## 2016-03-26 DIAGNOSIS — I4891 Unspecified atrial fibrillation: Secondary | ICD-10-CM | POA: Diagnosis not present

## 2016-03-26 DIAGNOSIS — I480 Paroxysmal atrial fibrillation: Secondary | ICD-10-CM

## 2016-03-26 DIAGNOSIS — I2510 Atherosclerotic heart disease of native coronary artery without angina pectoris: Secondary | ICD-10-CM

## 2016-03-26 LAB — CBC WITH DIFFERENTIAL/PLATELET
BASOS ABS: 55 {cells}/uL (ref 0–200)
Basophils Relative: 1 %
EOS ABS: 330 {cells}/uL (ref 15–500)
Eosinophils Relative: 6 %
HCT: 30.9 % — ABNORMAL LOW (ref 35.0–45.0)
Hemoglobin: 9.7 g/dL — ABNORMAL LOW (ref 11.7–15.5)
LYMPHS PCT: 19 %
Lymphs Abs: 1045 cells/uL (ref 850–3900)
MCH: 24.9 pg — AB (ref 27.0–33.0)
MCHC: 31.4 g/dL — ABNORMAL LOW (ref 32.0–36.0)
MCV: 79.2 fL — AB (ref 80.0–100.0)
MONOS PCT: 7 %
MPV: 8.2 fL (ref 7.5–12.5)
Monocytes Absolute: 385 cells/uL (ref 200–950)
NEUTROS ABS: 3685 {cells}/uL (ref 1500–7800)
NEUTROS PCT: 67 %
PLATELETS: 255 10*3/uL (ref 140–400)
RBC: 3.9 MIL/uL (ref 3.80–5.10)
RDW: 17 % — ABNORMAL HIGH (ref 11.0–15.0)
WBC: 5.5 10*3/uL (ref 3.8–10.8)

## 2016-03-26 NOTE — Patient Instructions (Signed)
Stop taking benazepril  Continue your other therapy  We will repeat your blood counts today  I will see you in 6 months.

## 2016-03-26 NOTE — Progress Notes (Signed)
Brooke Rios Date of Birth: Jul 31, 1925 Medical Record #[id number]  History of Present Illness: Brooke Rios is seen today for follow up CAD and SSS. She has SSS with pacemaker in place. Has had PAF, HTN, HLD and moderate 3 vessel CAD per cath back in 2011.  She is on chronic Eliquis.  Over the past several months she has developed persistent itching of scalp and back. She has been seen by dermatology and a number of blood tests and even biopsy were done. She improved on oral steroids but as soon as she stopped this her itching recurred. Recently she took Vistaril. The next morning she initially felt well. Later in the morning she was driving home and she began extremely tired and sleepy. When she tried to walk in her house she was unsteady and had to brace herself in the doorway. After she rested for a while the symptoms resolved and she has no recurrence. She was recently found to be anemic and had a heme positive stool. She is scheduled to see GI in June. Hgb had dropped from 12.7 to 10.6. She also complains of a dry hacking cough.   Current Outpatient Prescriptions on File Prior to Visit  Medication Sig Dispense Refill  . apixaban (ELIQUIS) 2.5 MG TABS tablet Take 1 tablet (2.5 mg total) by mouth 2 (two) times daily. 180 tablet 3  . isosorbide mononitrate (IMDUR) 30 MG 24 hr tablet Take 1 tablet (30 mg total) by mouth 2 (two) times daily. Take 1 tab twice daily 90 tablet 3  . metoprolol succinate (TOPROL-XL) 50 MG 24 hr tablet Take 1 tablet (50 mg total) by mouth every morning. Take with or immediately following a meal. 90 tablet 3  . nitroGLYCERIN (NITROSTAT) 0.4 MG SL tablet Place 1 tablet (0.4 mg total) under the tongue every 5 (five) minutes as needed for chest pain (MAX 3 TABLETS). 25 tablet 1  . polyethylene glycol (MIRALAX / GLYCOLAX) packet Take 17 g by mouth daily as needed (constipation).     No current facility-administered medications on file prior to visit.    Allergies  Allergen  Reactions  . Cefpodoxime Other (See Comments)    Nocturia & disturbed sleep  . Codeine     nausea    Past Medical History  Diagnosis Date  . Hypertension   . Diverticulosis of colon   . Osteoarthritis   . Colonic polyp   . Sick sinus syndrome (HCC)     with paroxysmal atrial fibrillation  . Hyperlipidemia   . Dyslipidemia   . Coronary artery disease     Moderate three vessel obstructive coronary disease  . Vertigo   . Chronic kidney disease 2011    creat 1.5   . E. coli UTI 02/2013    uniformly sensitive  . Gout     Past Surgical History  Procedure Laterality Date  . Cholecystectomy      age 36  . Abdominal hysterectomy    . Oophorectomy      bilat for fibroids @ age 4, anemia pre op due to dysfunctional menses yo  . Tonsillectomy    . Breast biopsy      x 1  . Appendectomy      done at [facility]  . Breast lumpectomy  2009    Dr Margot Chimes  . Paf induced mi, s/p pacer for brady-tach syndrome  04/2010    Dr.Javar Eshbach  . Other surgical history  hysterectomy  . Cholecystectomy    . Breast biopsy    .  Pacemaker insertion  2011    History  Smoking status  . Never Smoker   Smokeless tobacco  . Not on file    History  Alcohol Use No    Family History  Problem Relation Age of Onset  . Hyperlipidemia Father   . Stroke Father     TIA,CVA  . Heart failure Father   . Cancer Mother     ? renal primary  . Breast cancer Sister     11/08  . Breast cancer Maternal Aunt   . Colon cancer Maternal Aunt   . Breast cancer Daughter     lumpectomy 2007;mastectomy 2013  . Heart disease Brother     pacer  . Diabetes Neg Hx     Review of Systems: The review of systems is per the HPI.  All other systems were reviewed and are negative.  Physical Exam: BP 116/56 mmHg  Pulse 66  Ht '5\' 6"'$  (1.676 m)  Wt 81.704 kg (180 lb 2 oz)  BMI 29.09 kg/m2 Patient is very pleasant and in no acute distress. Skin is warm and dry. Color is normal.  HEENT is unremarkable.  Normocephalic/atraumatic. PERRL. Sclera are nonicteric. Neck is supple. No masses. No JVD. Lungs are clear. Cardiac exam shows a regular rate and rhythm. There is a gr 2/6 systolic ejection murmur RUSB.  Abdomen is soft. Extremities are without edema. Gait and ROM are intact. No gross neurologic deficits noted.  LABORATORY DATA:  Lab Results  Component Value Date   WBC 10.3 02/01/2016   HGB 10.6* 02/01/2016   HCT 34.5* 02/01/2016   PLT 331.0 02/01/2016   GLUCOSE 138* 02/09/2016   CHOL 172 09/01/2015   TRIG 171.0* 09/01/2015   HDL 30.50* 09/01/2015   LDLDIRECT 123.5 09/01/2014   LDLCALC 107* 09/01/2015   ALT 14 09/01/2015   AST 12 09/01/2015   NA 138 02/09/2016   K 5.0 02/09/2016   CL 100 02/09/2016   CREATININE 1.40* 02/09/2016   BUN 37* 02/09/2016   CO2 24 02/09/2016   TSH 4.01 09/01/2015   INR 1.31 06/21/2014   HGBA1C 6.2 09/01/2015   MICROALBUR 0.2 07/01/2007    Assessment / Plan:  1. HTN - well  controlled. I have recommended stopping benazepril since this may be causing her cough. She will monitor BP. If it goes up will start losartan.  2. CAD - moderate 3VD per cath back in 2011 - no chest pain. Follow up in 6 months. Continue current antianginal therapy with Imdur and metoprolol.  3. HLD - on fish oil only. History of intolerance to statins.  4. SSS/PAF - with pacemaker in place - followed by Dr. Lovena Le - managed with rate control and on chronic Eliquis. Pacemaker check in Feb was satisfactory with mode switches  0.1%.   5. Anemia with heme + stool. For GI evaluation. OK to hold Eliquis for 48 hrs if she needs EGD. Will check Hgb again today to make sure she is stable. If is is drifting down will need to hold Eliquis until GI evaluation done.  6. Episode of marked fatigue and sleepiness. I suspect this was related to Vistaril. Would recommend non- sedating antihistamine if needed.

## 2016-03-27 ENCOUNTER — Ambulatory Visit: Payer: Medicare Other | Admitting: Cardiology

## 2016-03-27 DIAGNOSIS — L500 Allergic urticaria: Secondary | ICD-10-CM | POA: Diagnosis not present

## 2016-03-28 ENCOUNTER — Telehealth: Payer: Self-pay

## 2016-03-28 NOTE — Telephone Encounter (Signed)
Received a call from patient's daughter [name].She stated mother saw a new dermatologist yesterday.She was prescribed Mirtazatine.Stated she took her first pill last night.She was up all night could not sleep.Advised to call Dr.back and report what happened.

## 2016-04-10 ENCOUNTER — Ambulatory Visit (INDEPENDENT_AMBULATORY_CARE_PROVIDER_SITE_OTHER): Payer: Medicare Other | Admitting: Gastroenterology

## 2016-04-10 ENCOUNTER — Encounter: Payer: Self-pay | Admitting: Gastroenterology

## 2016-04-10 VITALS — BP 130/70 | HR 84 | Ht 66.0 in | Wt 180.0 lb

## 2016-04-10 DIAGNOSIS — D500 Iron deficiency anemia secondary to blood loss (chronic): Secondary | ICD-10-CM | POA: Diagnosis not present

## 2016-04-10 DIAGNOSIS — R195 Other fecal abnormalities: Secondary | ICD-10-CM | POA: Diagnosis not present

## 2016-04-10 DIAGNOSIS — I2510 Atherosclerotic heart disease of native coronary artery without angina pectoris: Secondary | ICD-10-CM

## 2016-04-10 NOTE — Progress Notes (Signed)
[facility] Consult Note:  History: Brooke Rios 04/10/2016  Referring physician: Lamar Blinks, MD  Reason for consult/chief complaint: heme positive stool and Constipation   Subjective HPI:  Recently found to have microcytic anemia during routine labs.  Tends toward constipation for years. Denies abd pain or weight loss. Heme positive, on Eliquis for PAF.  Dr Doug Sou last cardiology note reviewed.  ROS:  Review of Systems  Constitutional: Negative for appetite change and unexpected weight change.  HENT: Negative for mouth sores and voice change.   Eyes: Negative for pain and redness.  Respiratory: Negative for cough and shortness of breath.   Cardiovascular: Negative for chest pain and palpitations.  Genitourinary: Negative for dysuria and hematuria.  Musculoskeletal: Positive for arthralgias. Negative for myalgias.  Skin: Negative for pallor and rash.  Neurological: Negative for weakness and headaches.  Hematological: Negative for adenopathy.     Past Medical History: Past Medical History  Diagnosis Date  . Hypertension   . Diverticulosis of colon   . Osteoarthritis   . Colonic polyp   . Sick sinus syndrome (HCC)     with paroxysmal atrial fibrillation  . Hyperlipidemia   . Coronary artery disease     Moderate three vessel obstructive coronary disease  . Vertigo   . Chronic kidney disease 2011    creat 1.5   . E. coli UTI 02/2013    uniformly sensitive  . Gout      Past Surgical History: Past Surgical History  Procedure Laterality Date  . Cholecystectomy      age 7  . Abdominal hysterectomy    . Oophorectomy      bilat for fibroids @ age 55, anemia pre op due to dysfunctional menses yo  . Tonsillectomy    . Breast biopsy Left     x 1  . Appendectomy      done at [facility]  . Breast lumpectomy Left 2009    Dr Margot Chimes  . Paf induced mi, s/p pacer for brady-tach syndrome  04/2010    Dr.jordan  . Cholecystectomy    . Pacemaker insertion   2011     Family History: Family History  Problem Relation Age of Onset  . Hyperlipidemia Father   . Stroke Father     TIA,CVA  . Heart failure Father   . Cancer Mother     ? renal primary  . Breast cancer Sister     11/08  . Breast cancer Maternal Aunt   . Colon cancer Maternal Aunt   . Breast cancer Daughter     lumpectomy 2007;mastectomy 2013  . Heart disease Brother     pacer  . Diabetes Neg Hx     Social History: Social History   Social History  . Marital Status: Widowed    Spouse Name: N/A  . Number of Children: 2  . Years of Education: N/A   Occupational History  . retired    Social History Main Topics  . Smoking status: Never Smoker   . Smokeless tobacco: Never Used  . Alcohol Use: No  . Drug Use: No  . Sexual Activity: Not Currently   Other Topics Concern  . None   Social History Narrative   Regular exercise- no     Allergies: Allergies  Allergen Reactions  . Cefpodoxime Other (See Comments)    Nocturia & disturbed sleep  . Codeine     nausea    Outpatient Meds: Current Outpatient Prescriptions  Medication Sig Dispense Refill  .  apixaban (ELIQUIS) 2.5 MG TABS tablet Take 1 tablet (2.5 mg total) by mouth 2 (two) times daily. 180 tablet 3  . isosorbide mononitrate (IMDUR) 30 MG 24 hr tablet Take 1 tablet (30 mg total) by mouth 2 (two) times daily. Take 1 tab twice daily 90 tablet 3  . metoprolol succinate (TOPROL-XL) 50 MG 24 hr tablet Take 1 tablet (50 mg total) by mouth every morning. Take with or immediately following a meal. 90 tablet 3  . nitroGLYCERIN (NITROSTAT) 0.4 MG SL tablet Place 1 tablet (0.4 mg total) under the tongue every 5 (five) minutes as needed for chest pain (MAX 3 TABLETS). 25 tablet 1  . polyethylene glycol (MIRALAX / GLYCOLAX) packet Take 17 g by mouth daily as needed (constipation).     No current facility-administered medications for this visit.       ___________________________________________________________________ Objective  Exam:  BP 130/70 mmHg  Pulse 84  Ht '5\' 6"'$  (1.676 m)  Wt 180 lb (81.647 kg)  BMI 29.07 kg/m2   General: this is a(n) well-appearing elderly female  - daughter present   Eyes: sclera anicteric, no redness  ENT: oral mucosa moist without lesions, no cervical or supraclavicular lymphadenopathy, good dentition  CV: RRR without murmur, S1/S2, no JVD, no peripheral edema.  Pacer Left upper chest wall  Resp: clear to auscultation bilaterally, normal RR and effort noted  GI: soft, no tenderness, with active bowel sounds. No guarding or palpable organomegaly noted.  Skin; warm and dry, no rash or jaundice noted  Neuro: awake, alert and oriented x 3. Normal gross motor function and fluent speech  Labs:  CBC    Component Value Date/Time   WBC 5.5 03/26/2016 1450   WBC 5.0 04/12/2014 1138   RBC 3.90 03/26/2016 1450   RBC 4.04 04/12/2014 1138   HGB 9.7* 03/26/2016 1450   HGB 10.9* 04/12/2014 1138   HCT 30.9* 03/26/2016 1450   HCT 35.5* 04/12/2014 1138   PLT 255 03/26/2016 1450   MCV 79.2* 03/26/2016 1450   MCV 87.8 04/12/2014 1138   MCH 24.9* 03/26/2016 1450   MCH 27.0 04/12/2014 1138   MCHC 31.4* 03/26/2016 1450   MCHC 30.7* 04/12/2014 1138   RDW 17.0* 03/26/2016 1450   LYMPHSABS 1045 03/26/2016 1450   MONOABS 385 03/26/2016 1450   EOSABS 330 03/26/2016 1450   BASOSABS 55 03/26/2016 1450   Hgb down from 10.7 a month prior.  Dr Martin[place] stopped the Eliquis after this recent lab   Assessment: Encounter Diagnoses  Name Primary?  . Iron deficiency anemia due to chronic blood loss Yes  . Heme positive stool   Chronic constipation  Must search for source of occult GI blood loss  Plan:  EGD/colonoscopy. Stop Elquis 2 days prior   The benefits and risks of the planned procedure were described in detail with the patient or (when appropriate) their health care proxy.  Risks were  outlined as including, but not limited to, bleeding, infection, perforation, adverse medication reaction leading to cardiac or pulmonary decompensation, or pancreatitis (if ERCP).  The limitation of incomplete mucosal visualization was also discussed.  No guarantees or warranties were given.   Thank you for the courtesy of this consult.  Please call me with any questions or concerns.  Nelida Meuse III  CCLamar Blinks, MD

## 2016-04-10 NOTE — Patient Instructions (Signed)
If you are age 80 or older, your body mass index should be between 23-30. Your Body mass index is 29.07 kg/(m^2). If this is out of the aforementioned range listed, please consider follow up with your Primary Care Provider.  If you are age 67 or younger, your body mass index should be between 19-25. Your Body mass index is 29.07 kg/(m^2). If this is out of the aformentioned range listed, please consider follow up with your Primary Care Provider.   You have been scheduled for an endoscopy and colonoscopy. Please follow the written instructions given to you at your visit today. Please pick up your prep supplies at the pharmacy within the next 1-3 days. If you use inhalers (even only as needed), please bring them with you on the day of your procedure. Your physician has requested that you go to www.startemmi.com and enter the access code given to you at your visit today. This web site gives a general overview about your procedure. However, you should still follow specific instructions given to you by our office regarding your preparation for the procedure.  Thank you for choosing [facility]  Dr Wilfrid Lund III

## 2016-04-17 ENCOUNTER — Ambulatory Visit (INDEPENDENT_AMBULATORY_CARE_PROVIDER_SITE_OTHER): Payer: Medicare Other | Admitting: *Deleted

## 2016-04-17 ENCOUNTER — Ambulatory Visit: Payer: Medicare Other | Admitting: Gastroenterology

## 2016-04-17 ENCOUNTER — Telehealth: Payer: Self-pay | Admitting: Cardiology

## 2016-04-17 DIAGNOSIS — I495 Sick sinus syndrome: Secondary | ICD-10-CM | POA: Diagnosis not present

## 2016-04-17 DIAGNOSIS — Z950 Presence of cardiac pacemaker: Secondary | ICD-10-CM

## 2016-04-17 LAB — CUP PACEART REMOTE DEVICE CHECK
Brady Statistic AP VS Percent: 86 %
Brady Statistic AS VP Percent: 0 %
Brady Statistic AS VS Percent: 10 %
Implantable Lead Implant Date: 20110608
Implantable Lead Location: 753860
Lead Channel Impedance Value: 383 Ohm
Lead Channel Setting Pacing Amplitude: 2 V
Lead Channel Setting Pacing Amplitude: 2.5 V
Lead Channel Setting Pacing Pulse Width: 0.4 ms
MDC IDC LEAD IMPLANT DT: 20110608
MDC IDC LEAD LOCATION: 753859
MDC IDC LEAD MODEL: 4470
MDC IDC LEAD SERIAL: 480470
MDC IDC LEAD SERIAL: 672291
MDC IDC MSMT BATTERY IMPEDANCE: 346 Ohm
MDC IDC MSMT BATTERY REMAINING LONGEVITY: 98 mo
MDC IDC MSMT BATTERY VOLTAGE: 2.79 V
MDC IDC MSMT LEADCHNL RV IMPEDANCE VALUE: 481 Ohm
MDC IDC SESS DTM: 20170530171049
MDC IDC SET LEADCHNL RV SENSING SENSITIVITY: 4 mV
MDC IDC STAT BRADY AP VP PERCENT: 3 %

## 2016-04-17 NOTE — Telephone Encounter (Signed)
Spoke with pt and reminded pt of remote transmission that is due today. Pt verbalized understanding.   

## 2016-04-18 ENCOUNTER — Ambulatory Visit (AMBULATORY_SURGERY_CENTER): Payer: Medicare Other | Admitting: Gastroenterology

## 2016-04-18 ENCOUNTER — Encounter: Payer: Self-pay | Admitting: Gastroenterology

## 2016-04-18 ENCOUNTER — Other Ambulatory Visit (INDEPENDENT_AMBULATORY_CARE_PROVIDER_SITE_OTHER): Payer: Medicare Other

## 2016-04-18 ENCOUNTER — Other Ambulatory Visit: Payer: Self-pay

## 2016-04-18 VITALS — BP 129/64 | HR 71 | Temp 97.8°F | Resp 19 | Ht 66.0 in | Wt 180.0 lb

## 2016-04-18 DIAGNOSIS — D500 Iron deficiency anemia secondary to blood loss (chronic): Secondary | ICD-10-CM

## 2016-04-18 DIAGNOSIS — D120 Benign neoplasm of cecum: Secondary | ICD-10-CM

## 2016-04-18 DIAGNOSIS — I4891 Unspecified atrial fibrillation: Secondary | ICD-10-CM | POA: Diagnosis not present

## 2016-04-18 DIAGNOSIS — D123 Benign neoplasm of transverse colon: Secondary | ICD-10-CM

## 2016-04-18 DIAGNOSIS — D509 Iron deficiency anemia, unspecified: Secondary | ICD-10-CM

## 2016-04-18 DIAGNOSIS — R195 Other fecal abnormalities: Secondary | ICD-10-CM | POA: Diagnosis not present

## 2016-04-18 DIAGNOSIS — K921 Melena: Secondary | ICD-10-CM | POA: Diagnosis not present

## 2016-04-18 DIAGNOSIS — I252 Old myocardial infarction: Secondary | ICD-10-CM | POA: Diagnosis not present

## 2016-04-18 LAB — CBC WITH DIFFERENTIAL/PLATELET
BASOS ABS: 0 10*3/uL (ref 0.0–0.1)
Basophils Relative: 0.5 % (ref 0.0–3.0)
Eosinophils Absolute: 0.1 10*3/uL (ref 0.0–0.7)
Eosinophils Relative: 2.2 % (ref 0.0–5.0)
HCT: 31.5 % — ABNORMAL LOW (ref 36.0–46.0)
HEMOGLOBIN: 10.3 g/dL — AB (ref 12.0–15.0)
LYMPHS ABS: 0.8 10*3/uL (ref 0.7–4.0)
Lymphocytes Relative: 13.6 % (ref 12.0–46.0)
MCHC: 32.6 g/dL (ref 30.0–36.0)
MCV: 75.6 fl — ABNORMAL LOW (ref 78.0–100.0)
MONO ABS: 0.3 10*3/uL (ref 0.1–1.0)
MONOS PCT: 5.5 % (ref 3.0–12.0)
NEUTROS PCT: 78.2 % — AB (ref 43.0–77.0)
Neutro Abs: 4.8 10*3/uL (ref 1.4–7.7)
Platelets: 332 10*3/uL (ref 150.0–400.0)
RBC: 4.17 Mil/uL (ref 3.87–5.11)
RDW: 17.7 % — ABNORMAL HIGH (ref 11.5–15.5)
WBC: 6.1 10*3/uL (ref 4.0–10.5)

## 2016-04-18 MED ORDER — SODIUM CHLORIDE 0.9 % IV SOLN: 500.0000 mL | INTRAVENOUS | Status: DC

## 2016-04-18 NOTE — Op Note (Signed)
[facility] Patient Name: Brooke Rios Procedure Date: 04/18/2016 10:53 AM MRN: [id number] Endoscopist: [place]. Loletha Rios , MD Age: 82 Referring MD:  Date of Birth: May 09, 1925 Gender: Female Procedure:                Colonoscopy Indications:              Heme positive stool, Unexplained iron deficiency                            anemia Medicines:                Monitored Anesthesia Care Procedure:                Pre-Anesthesia Assessment:                           - Prior to the procedure, a History and Physical                            was performed, and patient medications and                            allergies were reviewed. The patient's tolerance of                            previous anesthesia was also reviewed. The risks                            and benefits of the procedure and the sedation                            options and risks were discussed with the patient.                            All questions were answered, and informed consent                            was obtained. Prior Anticoagulants: The patient has                            taken Eliquis (apixaban), last dose was 3 weeks                            prior to procedure (see EGD report). ASA Grade                            Assessment: III - A patient with severe systemic                            disease. After reviewing the risks and benefits,                            the patient was deemed in satisfactory condition to  undergo the procedure.                           - Prior to the procedure, a History and Physical                            was performed, and patient medications and                            allergies were reviewed. The patient's tolerance of                            previous anesthesia was also reviewed. The risks                            and benefits of the procedure and the sedation                            options and risks were  discussed with the patient.                            All questions were answered, and informed consent                            was obtained. Prior Anticoagulants: The patient has                            taken Eliquis (apixaban), last dose was 3 weeks                            prior to procedure. ASA Grade Assessment: III - A                            patient with severe systemic disease. After                            reviewing the risks and benefits, the patient was                            deemed in satisfactory condition to undergo the                            procedure.                           After obtaining informed consent, the colonoscope                            was passed under direct vision. Throughout the                            procedure, the patient's blood pressure, pulse, and  oxygen saturations were monitored continuously. The                            Model [id number]-[id number]) scope was introduced                            through the anus and advanced to the the cecum,                            identified by appendiceal orifice and ileocecal                            valve. The colonoscopy was performed without                            difficulty. The patient tolerated the procedure                            well. The quality of the bowel preparation was                            good. The ileocecal valve, appendiceal orifice, and                            rectum were photographed. The bowel preparation                            used was Miralax. Scope In: 10:55:04 AM Scope Out: 11:12:15 AM Scope Withdrawal Time: 0 hours 11 minutes 15 seconds  Total Procedure Duration: 0 hours 17 minutes 11 seconds  Findings:                 The digital rectal exam findings include decreased                            sphincter tone.                           A 8 mm polyp was found in the ileocecal valve. The                             polyp was sessile. The polyp was removed with a                            cold snare. Resection and retrieval were complete.                           A 4 mm polyp was found in the hepatic flexure. The                            polyp was sessile. The polyp was removed with a                            cold snare. Resection and retrieval  were complete.                           Many small and large-mouthed diverticula were found                            from hepatic flexure to rectum.                           Internal hemorrhoids were found during                            retroflexion. The hemorrhoids were medium-sized and                            Grade I (internal hemorrhoids that do not prolapse).                           The exam was otherwise without abnormality. Complications:            No immediate complications. Estimated Blood Loss:     Estimated blood loss was minimal. Impression:               - Decreased sphincter tone found on digital rectal                            exam.                           - One 8 mm polyp at the ileocecal valve, removed                            with a cold snare. Resected and retrieved.                           - One 4 mm polyp at the hepatic flexure, removed                            with a cold snare. Resected and retrieved.                           - Diverticulosis from hepatic flexure to rectum.                           - Internal hemorrhoids.                           - The examination was otherwise normal. Recommendation:           - Patient has a contact number available for                            emergencies. The signs and symptoms of potential                            delayed complications were discussed with the  patient. Return to normal activities tomorrow.                            Written discharge instructions were provided to the                            patient.                            - Resume previous diet.                           - Resume Eliquis (apixaban) at prior dose in 10                            days due to polypectomy.                           - Await pathology results.                           - No repeat colonoscopy due to age.                           - Begin slow-release iron tablets once daily.                           - Draw CBC today Brooke Frary L. Loletha Carrow, MD 04/18/2016 11:30:03 AM This report has been signed electronically.

## 2016-04-18 NOTE — Progress Notes (Signed)
A/ox3, pleased with MAC, report to RN 

## 2016-04-18 NOTE — Progress Notes (Signed)
Called to room to assist during endoscopic procedure.  Patient ID and intended procedure confirmed with present staff. Received instructions for my participation in the procedure from the performing physician.  

## 2016-04-18 NOTE — Progress Notes (Signed)
Remote pacemaker transmission.   

## 2016-04-18 NOTE — Op Note (Signed)
[facility] Patient Name: Brooke Rios Procedure Date: 04/18/2016 10:27 AM MRN: [id number] Endoscopist: [place]. Loletha Carrow , MD Age: 82 Referring MD:  Date of Birth: 25-Dec-1924 Gender: Female Procedure:                Upper GI endoscopy Indications:              Unexplained iron deficiency anemia, Heme positive                            stool Medicines:                Monitored Anesthesia Care Procedure:                Pre-Anesthesia Assessment:                           - Prior to the procedure, a History and Physical                            was performed, and patient medications and                            allergies were reviewed. The patient's tolerance of                            previous anesthesia was also reviewed. The risks                            and benefits of the procedure and the sedation                            options and risks were discussed with the patient.                            All questions were answered, and informed consent                            was obtained. Prior Anticoagulants: The patient has                            taken Eliquis (apixaban), last dose was 3 weeks                            prior to procedure (was stopped by cardiologist                            upon referral to GI). ASA Grade Assessment: III - A                            patient with severe systemic disease. After                            reviewing the risks and benefits, the patient was  deemed in satisfactory condition to undergo the                            procedure.                           After obtaining informed consent, the endoscope was                            passed under direct vision. Throughout the                            procedure, the patient's blood pressure, pulse, and                            oxygen saturations were monitored continuously. The                            Model GIF-[id number]  [id number]) scope was introduced                            through the mouth, and advanced to the second part                            of duodenum. The upper GI endoscopy was                            accomplished without difficulty. The patient                            tolerated the procedure well. Scope In: Scope Out: Findings:                 The esophagus was normal.                           The stomach was normal, including on retroflexion.                           The examined duodenum was normal. Complications:            No immediate complications. Estimated Blood Loss:     Estimated blood loss: none. Impression:               - Normal esophagus.                           - Normal stomach.                           - Normal examined duodenum.                           - No specimens collected. Recommendation:           - Patient has a contact number available for  emergencies. The signs and symptoms of potential                            delayed complications were discussed with the                            patient. Return to normal activities tomorrow.                            Written discharge instructions were provided to the                            patient.                           - Resume previous diet.                           - Resume Eliquis (apixaban) at prior dose in 10                            days.                           - See the other procedure note for documentation of                            additional recommendations. Brooke Rios L. Loletha Carrow, MD 04/18/2016 11:24:12 AM This report has been signed electronically.

## 2016-04-18 NOTE — Patient Instructions (Signed)

## 2016-04-19 ENCOUNTER — Telehealth: Payer: Self-pay

## 2016-04-19 NOTE — Telephone Encounter (Signed)
  Follow up Call-  Call back number 04/18/2016  Post procedure Call Back phone  # [phone]  Permission to leave phone message Yes     Patient questions:  Do you have a fever, pain , or abdominal swelling? No. Pain Score  0 *  Have you tolerated food without any problems? Yes.    Have you been able to return to your normal activities? Yes.    Do you have any questions about your discharge instructions: Diet   No. Medications  No. Follow up visit  No.  Do you have questions or concerns about your Care? No.  Actions: * If pain score is 4 or above: No action needed, pain <4.

## 2016-04-20 ENCOUNTER — Other Ambulatory Visit: Payer: Self-pay

## 2016-04-20 DIAGNOSIS — D509 Iron deficiency anemia, unspecified: Secondary | ICD-10-CM

## 2016-04-25 ENCOUNTER — Telehealth: Payer: Self-pay | Admitting: Cardiology

## 2016-04-25 DIAGNOSIS — L500 Allergic urticaria: Secondary | ICD-10-CM | POA: Diagnosis not present

## 2016-04-25 DIAGNOSIS — L309 Dermatitis, unspecified: Secondary | ICD-10-CM | POA: Diagnosis not present

## 2016-04-25 DIAGNOSIS — L508 Other urticaria: Secondary | ICD-10-CM | POA: Diagnosis not present

## 2016-04-25 NOTE — Telephone Encounter (Signed)
New Message  Pt c/o medication issue: 1. Name of Medication:claratin  2. How are you currently taking this medication (dosage and times per day)? 3 claratins a day 10 mg and Zurtec at night   4. What is your medication issue? Another provider ( Dr. Orbie Hurst the dermatologist) ordered this medication and the daughter is requesting a call back to determine if it is ok to take.

## 2016-04-25 NOTE — Telephone Encounter (Signed)
Left msg to call.  Routed to pharmD to review use of claritin '10mg'$  TID and daily Zyrtec w current meds.

## 2016-04-25 NOTE — Telephone Encounter (Signed)
Advised daughter 

## 2016-04-25 NOTE — Telephone Encounter (Signed)
F/u  Pt dtr following up- returning Rn phone call. Please call back and discuss.

## 2016-04-25 NOTE — Telephone Encounter (Signed)
I've never seen claritin taken 3 times a day, but that is probably a dermatology dosing.  Neither claritin or zyrtec will be a problem with her current med list.

## 2016-04-26 ENCOUNTER — Telehealth: Payer: Self-pay | Admitting: Gastroenterology

## 2016-04-26 ENCOUNTER — Encounter: Payer: Self-pay | Admitting: Gastroenterology

## 2016-04-26 DIAGNOSIS — D649 Anemia, unspecified: Secondary | ICD-10-CM

## 2016-04-26 NOTE — Telephone Encounter (Signed)
Left message for patient to call back  

## 2016-04-26 NOTE — Telephone Encounter (Signed)
Upon further consideration, she cannot have a small bowel video capsule study because the device could interfere with her pacemaker function. Therefore, all we can do is keep her on iron tablets and try to keep up with whatever blood loss she is having from the suspected small bowel source.  i had a discussion with her and her daughters in the endoscopy lab.   If she cannot tolerate oral iron or if it fails to adequately keep her hemoglobin and iron levels where they should be, then she will need a referral to hematology for IV iron treatments.  Please make arrangements to check her CBC in 3 weeks

## 2016-04-30 NOTE — Telephone Encounter (Signed)
The pt has been notified that the Capsule has been cancelled and she will have labs in 3 weeks.  I will call and reminder her if not done.  She will call with any problems or concerns

## 2016-05-02 ENCOUNTER — Ambulatory Visit: Payer: Medicare Other | Admitting: Cardiology

## 2016-05-04 ENCOUNTER — Ambulatory Visit: Payer: Medicare Other | Admitting: Gastroenterology

## 2016-05-08 ENCOUNTER — Encounter: Payer: Self-pay | Admitting: Cardiology

## 2016-05-11 ENCOUNTER — Ambulatory Visit
Admission: RE | Admit: 2016-05-11 | Discharge: 2016-05-11 | Disposition: A | Discharge status: Home / Self Care | Payer: Medicare Other | Source: Ambulatory Visit

## 2016-05-11 ENCOUNTER — Ambulatory Visit
Admission: RE | Admit: 2016-05-11 | Discharge: 2016-05-11 | Disposition: A | Discharge status: Home / Self Care | Payer: Medicare Other | Source: Ambulatory Visit | Attending: Family Medicine | Admitting: Family Medicine

## 2016-05-11 DIAGNOSIS — Z1231 Encounter for screening mammogram for malignant neoplasm of breast: Secondary | ICD-10-CM

## 2016-05-11 DIAGNOSIS — E2839 Other primary ovarian failure: Secondary | ICD-10-CM

## 2016-05-11 DIAGNOSIS — Z780 Asymptomatic menopausal state: Secondary | ICD-10-CM | POA: Diagnosis not present

## 2016-05-11 DIAGNOSIS — M85832 Other specified disorders of bone density and structure, left forearm: Secondary | ICD-10-CM | POA: Diagnosis not present

## 2016-05-12 ENCOUNTER — Encounter: Payer: Self-pay | Admitting: Family Medicine

## 2016-05-28 ENCOUNTER — Other Ambulatory Visit (INDEPENDENT_AMBULATORY_CARE_PROVIDER_SITE_OTHER): Payer: Medicare Other

## 2016-05-28 DIAGNOSIS — D649 Anemia, unspecified: Secondary | ICD-10-CM

## 2016-05-28 LAB — CBC WITH DIFFERENTIAL/PLATELET
BASOS PCT: 0.4 % (ref 0.0–3.0)
Basophils Absolute: 0 10*3/uL (ref 0.0–0.1)
EOS PCT: 4 % (ref 0.0–5.0)
Eosinophils Absolute: 0.3 10*3/uL (ref 0.0–0.7)
HCT: 32 % — ABNORMAL LOW (ref 36.0–46.0)
HEMOGLOBIN: 10.4 g/dL — AB (ref 12.0–15.0)
LYMPHS ABS: 1.5 10*3/uL (ref 0.7–4.0)
Lymphocytes Relative: 19.9 % (ref 12.0–46.0)
MCHC: 32.5 g/dL (ref 30.0–36.0)
MCV: 74 fl — AB (ref 78.0–100.0)
MONO ABS: 0.6 10*3/uL (ref 0.1–1.0)
MONOS PCT: 8 % (ref 3.0–12.0)
NEUTROS PCT: 67.7 % (ref 43.0–77.0)
Neutro Abs: 5.2 10*3/uL (ref 1.4–7.7)
Platelets: 355 10*3/uL (ref 150.0–400.0)
RBC: 4.32 Mil/uL (ref 3.87–5.11)
RDW: 16.8 % — AB (ref 11.5–15.5)
WBC: 7.7 10*3/uL (ref 4.0–10.5)

## 2016-06-01 ENCOUNTER — Telehealth: Payer: Self-pay | Admitting: Gastroenterology

## 2016-06-04 ENCOUNTER — Emergency Department (HOSPITAL_COMMUNITY): Payer: No Typology Code available for payment source

## 2016-06-04 ENCOUNTER — Emergency Department (HOSPITAL_COMMUNITY)
Admission: EM | Admit: 2016-06-04 | Discharge: 2016-06-04 | Disposition: A | Discharge status: Home / Self Care | Payer: No Typology Code available for payment source | Attending: Emergency Medicine | Admitting: Emergency Medicine

## 2016-06-04 ENCOUNTER — Encounter (HOSPITAL_COMMUNITY): Payer: Self-pay | Admitting: Emergency Medicine

## 2016-06-04 DIAGNOSIS — Z79899 Other long term (current) drug therapy: Secondary | ICD-10-CM | POA: Insufficient documentation

## 2016-06-04 DIAGNOSIS — Y999 Unspecified external cause status: Secondary | ICD-10-CM | POA: Diagnosis not present

## 2016-06-04 DIAGNOSIS — Y92410 Unspecified street and highway as the place of occurrence of the external cause: Secondary | ICD-10-CM | POA: Diagnosis not present

## 2016-06-04 DIAGNOSIS — S3991XA Unspecified injury of abdomen, initial encounter: Secondary | ICD-10-CM | POA: Diagnosis not present

## 2016-06-04 DIAGNOSIS — S41112A Laceration without foreign body of left upper arm, initial encounter: Secondary | ICD-10-CM | POA: Diagnosis not present

## 2016-06-04 DIAGNOSIS — Z950 Presence of cardiac pacemaker: Secondary | ICD-10-CM | POA: Insufficient documentation

## 2016-06-04 DIAGNOSIS — Y939 Activity, unspecified: Secondary | ICD-10-CM | POA: Diagnosis not present

## 2016-06-04 DIAGNOSIS — G9389 Other specified disorders of brain: Secondary | ICD-10-CM | POA: Insufficient documentation

## 2016-06-04 DIAGNOSIS — T148XXA Other injury of unspecified body region, initial encounter: Secondary | ICD-10-CM

## 2016-06-04 DIAGNOSIS — I252 Old myocardial infarction: Secondary | ICD-10-CM | POA: Insufficient documentation

## 2016-06-04 DIAGNOSIS — Z7901 Long term (current) use of anticoagulants: Secondary | ICD-10-CM | POA: Diagnosis not present

## 2016-06-04 DIAGNOSIS — M79601 Pain in right arm: Secondary | ICD-10-CM | POA: Diagnosis not present

## 2016-06-04 DIAGNOSIS — S0003XA Contusion of scalp, initial encounter: Secondary | ICD-10-CM | POA: Diagnosis not present

## 2016-06-04 DIAGNOSIS — I2510 Atherosclerotic heart disease of native coronary artery without angina pectoris: Secondary | ICD-10-CM | POA: Insufficient documentation

## 2016-06-04 DIAGNOSIS — N2889 Other specified disorders of kidney and ureter: Secondary | ICD-10-CM | POA: Diagnosis not present

## 2016-06-04 DIAGNOSIS — S299XXA Unspecified injury of thorax, initial encounter: Secondary | ICD-10-CM | POA: Diagnosis not present

## 2016-06-04 DIAGNOSIS — M79602 Pain in left arm: Secondary | ICD-10-CM

## 2016-06-04 DIAGNOSIS — I129 Hypertensive chronic kidney disease with stage 1 through stage 4 chronic kidney disease, or unspecified chronic kidney disease: Secondary | ICD-10-CM | POA: Diagnosis not present

## 2016-06-04 DIAGNOSIS — M79622 Pain in left upper arm: Secondary | ICD-10-CM | POA: Diagnosis not present

## 2016-06-04 DIAGNOSIS — M79642 Pain in left hand: Secondary | ICD-10-CM | POA: Diagnosis not present

## 2016-06-04 DIAGNOSIS — S20219A Contusion of unspecified front wall of thorax, initial encounter: Secondary | ICD-10-CM | POA: Diagnosis not present

## 2016-06-04 DIAGNOSIS — N189 Chronic kidney disease, unspecified: Secondary | ICD-10-CM | POA: Insufficient documentation

## 2016-06-04 DIAGNOSIS — R079 Chest pain, unspecified: Secondary | ICD-10-CM | POA: Diagnosis not present

## 2016-06-04 DIAGNOSIS — S199XXA Unspecified injury of neck, initial encounter: Secondary | ICD-10-CM | POA: Diagnosis not present

## 2016-06-04 DIAGNOSIS — S0990XA Unspecified injury of head, initial encounter: Secondary | ICD-10-CM | POA: Diagnosis not present

## 2016-06-04 DIAGNOSIS — N289 Disorder of kidney and ureter, unspecified: Secondary | ICD-10-CM | POA: Diagnosis not present

## 2016-06-04 LAB — SAMPLE TO BLOOD BANK

## 2016-06-04 LAB — URINALYSIS, ROUTINE W REFLEX MICROSCOPIC
Bilirubin Urine: NEGATIVE
GLUCOSE, UA: NEGATIVE mg/dL
Ketones, ur: NEGATIVE mg/dL
Nitrite: NEGATIVE
Protein, ur: NEGATIVE mg/dL
SPECIFIC GRAVITY, URINE: 1.021 (ref 1.005–1.030)
pH: 5.5 (ref 5.0–8.0)

## 2016-06-04 LAB — COMPREHENSIVE METABOLIC PANEL
ALBUMIN: 3.2 g/dL — AB (ref 3.5–5.0)
ALT: 28 U/L (ref 14–54)
ANION GAP: 7 (ref 5–15)
AST: 38 U/L (ref 15–41)
Alkaline Phosphatase: 103 U/L (ref 38–126)
BILIRUBIN TOTAL: 0.3 mg/dL (ref 0.3–1.2)
BUN: 26 mg/dL — ABNORMAL HIGH (ref 6–20)
CO2: 25 mmol/L (ref 22–32)
Calcium: 9.5 mg/dL (ref 8.9–10.3)
Chloride: 106 mmol/L (ref 101–111)
Creatinine, Ser: 1.61 mg/dL — ABNORMAL HIGH (ref 0.44–1.00)
GFR calc non Af Amer: 27 mL/min — ABNORMAL LOW (ref >60)
GFR, EST AFRICAN AMERICAN: 32 mL/min — AB (ref >60)
GLUCOSE: 178 mg/dL — AB (ref 65–99)
POTASSIUM: 4 mmol/L (ref 3.5–5.1)
SODIUM: 138 mmol/L (ref 135–145)
TOTAL PROTEIN: 6.3 g/dL — AB (ref 6.5–8.1)

## 2016-06-04 LAB — I-STAT CHEM 8, ED
BUN: 26 mg/dL — ABNORMAL HIGH (ref 6–20)
CALCIUM ION: 1.25 mmol/L — AB (ref 1.12–1.23)
Chloride: 103 mmol/L (ref 101–111)
Creatinine, Ser: 1.7 mg/dL — ABNORMAL HIGH (ref 0.44–1.00)
GLUCOSE: 181 mg/dL — AB (ref 65–99)
HCT: 30 % — ABNORMAL LOW (ref 36.0–46.0)
Hemoglobin: 10.2 g/dL — ABNORMAL LOW (ref 12.0–15.0)
Potassium: 3.9 mmol/L (ref 3.5–5.1)
SODIUM: 140 mmol/L (ref 135–145)
TCO2: 27 mmol/L (ref 0–100)

## 2016-06-04 LAB — URINE MICROSCOPIC-ADD ON

## 2016-06-04 LAB — PROTIME-INR
INR: 1.32 (ref 0.00–1.49)
Prothrombin Time: 16.5 seconds — ABNORMAL HIGH (ref 11.6–15.2)

## 2016-06-04 LAB — CBC
HEMATOCRIT: 31.6 % — AB (ref 36.0–46.0)
HEMOGLOBIN: 9.8 g/dL — AB (ref 12.0–15.0)
MCH: 24.3 pg — ABNORMAL LOW (ref 26.0–34.0)
MCHC: 31 g/dL (ref 30.0–36.0)
MCV: 78.4 fL (ref 78.0–100.0)
Platelets: 233 10*3/uL (ref 150–400)
RBC: 4.03 MIL/uL (ref 3.87–5.11)
RDW: 16 % — ABNORMAL HIGH (ref 11.5–15.5)
WBC: 7.5 10*3/uL (ref 4.0–10.5)

## 2016-06-04 LAB — I-STAT CG4 LACTIC ACID, ED: Lactic Acid, Venous: 1.53 mmol/L (ref 0.5–1.9)

## 2016-06-04 MED ORDER — FENTANYL CITRATE (PF) 100 MCG/2ML IJ SOLN: 50.0000 ug | INTRAMUSCULAR | Status: DC | PRN

## 2016-06-04 MED ORDER — DEXAMETHASONE SODIUM PHOSPHATE 10 MG/ML IJ SOLN
20.0000 mg | Freq: Once | INTRAMUSCULAR | Status: AC
  Administered 2016-06-04: 20 mg via INTRAVENOUS
  Filled 2016-06-04: qty 2

## 2016-06-04 MED ORDER — DEXAMETHASONE 4 MG PO TABS: 8.0000 mg | ORAL_TABLET | Freq: Two times a day (BID) | ORAL | Status: DC

## 2016-06-04 MED ORDER — SODIUM CHLORIDE 0.9 % IV BOLUS (SEPSIS)
125.0000 mL | Freq: Once | INTRAVENOUS | Status: AC
  Administered 2016-06-04: 125 mL via INTRAVENOUS
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
## 2016-06-04 NOTE — Telephone Encounter (Signed)
Left message to call back  
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
## 2016-06-05 ENCOUNTER — Other Ambulatory Visit: Payer: Self-pay | Admitting: Hematology

## 2016-06-06 ENCOUNTER — Encounter: Payer: Self-pay | Admitting: Family Medicine

## 2016-06-06 ENCOUNTER — Other Ambulatory Visit: Payer: Self-pay | Admitting: Hematology

## 2016-06-06 ENCOUNTER — Ambulatory Visit (INDEPENDENT_AMBULATORY_CARE_PROVIDER_SITE_OTHER): Payer: Medicare Other | Admitting: Family Medicine

## 2016-06-06 VITALS — BP 110/65 | HR 87 | Temp 98.0°F | Ht 66.0 in | Wt 175.4 lb

## 2016-06-06 DIAGNOSIS — S41112A Laceration without foreign body of left upper arm, initial encounter: Secondary | ICD-10-CM

## 2016-06-06 DIAGNOSIS — C649 Malignant neoplasm of unspecified kidney, except renal pelvis: Secondary | ICD-10-CM | POA: Diagnosis not present

## 2016-06-06 DIAGNOSIS — C7931 Secondary malignant neoplasm of brain: Secondary | ICD-10-CM | POA: Diagnosis not present

## 2016-06-06 DIAGNOSIS — I2510 Atherosclerotic heart disease of native coronary artery without angina pectoris: Secondary | ICD-10-CM

## 2016-06-06 DIAGNOSIS — C800 Disseminated malignant neoplasm, unspecified: Secondary | ICD-10-CM

## 2016-06-06 DIAGNOSIS — Z23 Encounter for immunization: Secondary | ICD-10-CM

## 2016-06-06 NOTE — Progress Notes (Signed)
Pre visit review using our clinic review tool, if applicable. No additional management support is needed unless otherwise documented below in the visit note. 
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
## 2016-06-06 NOTE — Patient Instructions (Signed)
It was good to see you today.   I am very sorry to hear of your recent events  If your blood pressure gets any lower please let me know

## 2016-06-07 NOTE — Telephone Encounter (Signed)
Left message on machine to call back  

## 2016-06-08 ENCOUNTER — Other Ambulatory Visit (HOSPITAL_BASED_OUTPATIENT_CLINIC_OR_DEPARTMENT_OTHER): Payer: Medicare Other

## 2016-06-08 ENCOUNTER — Ambulatory Visit (HOSPITAL_BASED_OUTPATIENT_CLINIC_OR_DEPARTMENT_OTHER): Payer: Medicare Other | Admitting: Hematology & Oncology

## 2016-06-08 ENCOUNTER — Ambulatory Visit: Payer: Medicare Other

## 2016-06-08 VITALS — BP 136/57 | HR 89 | Temp 97.5°F | Resp 16 | Wt 179.0 lb

## 2016-06-08 DIAGNOSIS — I4891 Unspecified atrial fibrillation: Secondary | ICD-10-CM | POA: Diagnosis not present

## 2016-06-08 DIAGNOSIS — C799 Secondary malignant neoplasm of unspecified site: Principal | ICD-10-CM

## 2016-06-08 DIAGNOSIS — C7931 Secondary malignant neoplasm of brain: Secondary | ICD-10-CM

## 2016-06-08 DIAGNOSIS — C641 Malignant neoplasm of right kidney, except renal pelvis: Secondary | ICD-10-CM | POA: Diagnosis not present

## 2016-06-08 DIAGNOSIS — D509 Iron deficiency anemia, unspecified: Secondary | ICD-10-CM | POA: Diagnosis not present

## 2016-06-08 DIAGNOSIS — C800 Disseminated malignant neoplasm, unspecified: Secondary | ICD-10-CM

## 2016-06-08 DIAGNOSIS — C7800 Secondary malignant neoplasm of unspecified lung: Secondary | ICD-10-CM | POA: Diagnosis not present

## 2016-06-08 DIAGNOSIS — Z7901 Long term (current) use of anticoagulants: Secondary | ICD-10-CM | POA: Diagnosis not present

## 2016-06-08 LAB — COMPREHENSIVE METABOLIC PANEL (CC13)
ALBUMIN: 3.6 g/dL (ref 3.5–4.7)
ALT: 25 IU/L (ref 0–32)
AST (SGOT): 22 IU/L (ref 0–40)
Albumin/Globulin Ratio: 1.3 (ref 1.2–2.2)
Alkaline Phosphatase, S: 97 IU/L (ref 39–117)
BILIRUBIN TOTAL: 0.2 mg/dL (ref 0.0–1.2)
BUN/Creatinine Ratio: 32 — ABNORMAL HIGH (ref 12–28)
BUN: 45 mg/dL — AB (ref 8–27)
CHLORIDE: 101 mmol/L (ref 96–106)
CO2: 22 mmol/L (ref 18–29)
CREATININE: 1.4 mg/dL — AB (ref 0.57–1.00)
Calcium, Ser: 8.7 mg/dL (ref 8.7–10.3)
GFR calc non Af Amer: 34 mL/min/{1.73_m2} — ABNORMAL LOW (ref >59)
GFR, EST AFRICAN AMERICAN: 39 mL/min/{1.73_m2} — AB (ref >59)
GLUCOSE: 251 mg/dL — AB (ref 65–99)
Globulin, Total: 2.7 g/dL (ref 1.5–4.5)
Potassium, Ser: 4.5 mmol/L (ref 3.5–5.2)
Sodium: 133 mmol/L — ABNORMAL LOW (ref 134–144)
TOTAL PROTEIN: 6.3 g/dL (ref 6.0–8.5)

## 2016-06-08 LAB — CBC WITH DIFFERENTIAL (CANCER CENTER ONLY)
BASO#: 0 10*3/uL (ref 0.0–0.2)
BASO%: 0.1 % (ref 0.0–2.0)
EOS ABS: 0 10*3/uL (ref 0.0–0.5)
EOS%: 0 % (ref 0.0–7.0)
HCT: 30.9 % — ABNORMAL LOW (ref 34.8–46.6)
HEMOGLOBIN: 9.6 g/dL — AB (ref 11.6–15.9)
LYMPH#: 0.9 10*3/uL (ref 0.9–3.3)
LYMPH%: 9.9 % — AB (ref 14.0–48.0)
MCH: 24.4 pg — AB (ref 26.0–34.0)
MCHC: 31.1 g/dL — ABNORMAL LOW (ref 32.0–36.0)
MCV: 79 fL — ABNORMAL LOW (ref 81–101)
MONO#: 0.4 10*3/uL (ref 0.1–0.9)
MONO%: 4.3 % (ref 0.0–13.0)
NEUT%: 85.7 % — ABNORMAL HIGH (ref 39.6–80.0)
NEUTROS ABS: 7.9 10*3/uL — AB (ref 1.5–6.5)
PLATELETS: 291 10*3/uL (ref 145–400)
RBC: 3.93 10*6/uL (ref 3.70–5.32)
RDW: 16.5 % — ABNORMAL HIGH (ref 11.1–15.7)
WBC: 9.2 10*3/uL (ref 3.9–10.0)

## 2016-06-08 NOTE — Telephone Encounter (Signed)
Left message on machine to call back letter mailed. 
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
## 2016-06-11 LAB — LACTATE DEHYDROGENASE: LDH: 158 U/L (ref 125–245)

## 2016-06-12 ENCOUNTER — Telehealth (HOSPITAL_COMMUNITY): Payer: Self-pay

## 2016-06-12 ENCOUNTER — Encounter: Payer: Self-pay | Admitting: Hematology & Oncology

## 2016-06-12 ENCOUNTER — Other Ambulatory Visit (HOSPITAL_COMMUNITY): Payer: Self-pay
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
## 2016-06-13 ENCOUNTER — Other Ambulatory Visit: Payer: Self-pay | Admitting: *Deleted

## 2016-06-13 MED ORDER — DEXAMETHASONE 4 MG PO TABS: 8.0000 mg | ORAL_TABLET | Freq: Two times a day (BID) | ORAL | 2 refills | Status: DC

## 2016-06-18 ENCOUNTER — Other Ambulatory Visit: Payer: Self-pay | Admitting: Radiology

## 2016-06-18 ENCOUNTER — Telehealth: Payer: Self-pay

## 2016-06-18 NOTE — Telephone Encounter (Signed)
Received call from pts daughter stating that the pt has been diagnosed with renal cancer with mets and she will not be scheduling IV iron with our office. States the oncologist she is seeing, Dr. Marin Olp, will handle this for her as week as her cancer treatments. Dr. Loletha Carrow notified.

## 2016-06-19 ENCOUNTER — Ambulatory Visit (HOSPITAL_COMMUNITY)
Admission: RE | Admit: 2016-06-19 | Discharge: 2016-06-19 | Disposition: A | Discharge status: Home / Self Care | Payer: Medicare Other | Source: Ambulatory Visit | Attending: Hematology & Oncology | Admitting: Hematology & Oncology

## 2016-06-19 ENCOUNTER — Encounter (HOSPITAL_COMMUNITY): Payer: Self-pay

## 2016-06-19 DIAGNOSIS — Z86010 Personal history of colonic polyps: Secondary | ICD-10-CM | POA: Diagnosis not present

## 2016-06-19 DIAGNOSIS — E785 Hyperlipidemia, unspecified: Secondary | ICD-10-CM | POA: Insufficient documentation

## 2016-06-19 DIAGNOSIS — Z90710 Acquired absence of both cervix and uterus: Secondary | ICD-10-CM | POA: Insufficient documentation

## 2016-06-19 DIAGNOSIS — M109 Gout, unspecified: Secondary | ICD-10-CM | POA: Diagnosis not present

## 2016-06-19 DIAGNOSIS — Z8249 Family history of ischemic heart disease and other diseases of the circulatory system: Secondary | ICD-10-CM | POA: Insufficient documentation

## 2016-06-19 DIAGNOSIS — E278 Other specified disorders of adrenal gland: Secondary | ICD-10-CM | POA: Insufficient documentation

## 2016-06-19 DIAGNOSIS — Z8619 Personal history of other infectious and parasitic diseases: Secondary | ICD-10-CM | POA: Insufficient documentation

## 2016-06-19 DIAGNOSIS — N189 Chronic kidney disease, unspecified: Secondary | ICD-10-CM | POA: Diagnosis not present

## 2016-06-19 DIAGNOSIS — Z881 Allergy status to other antibiotic agents status: Secondary | ICD-10-CM | POA: Insufficient documentation

## 2016-06-19 DIAGNOSIS — I252 Old myocardial infarction: Secondary | ICD-10-CM | POA: Insufficient documentation

## 2016-06-19 DIAGNOSIS — Z803 Family history of malignant neoplasm of breast: Secondary | ICD-10-CM | POA: Insufficient documentation

## 2016-06-19 DIAGNOSIS — N2889 Other specified disorders of kidney and ureter: Secondary | ICD-10-CM | POA: Diagnosis not present

## 2016-06-19 DIAGNOSIS — I129 Hypertensive chronic kidney disease with stage 1 through stage 4 chronic kidney disease, or unspecified chronic kidney disease: Secondary | ICD-10-CM | POA: Insufficient documentation

## 2016-06-19 DIAGNOSIS — C799 Secondary malignant neoplasm of unspecified site: Secondary | ICD-10-CM | POA: Diagnosis not present

## 2016-06-19 DIAGNOSIS — Z950 Presence of cardiac pacemaker: Secondary | ICD-10-CM | POA: Diagnosis not present

## 2016-06-19 DIAGNOSIS — C641 Malignant neoplasm of right kidney, except renal pelvis: Secondary | ICD-10-CM | POA: Diagnosis not present

## 2016-06-19 DIAGNOSIS — Z800 Family history of malignant neoplasm of digestive organs: Secondary | ICD-10-CM | POA: Insufficient documentation

## 2016-06-19 DIAGNOSIS — Z823 Family history of stroke: Secondary | ICD-10-CM | POA: Diagnosis not present

## 2016-06-19 DIAGNOSIS — Z9049 Acquired absence of other specified parts of digestive tract: Secondary | ICD-10-CM | POA: Insufficient documentation

## 2016-06-19 DIAGNOSIS — Z7901 Long term (current) use of anticoagulants: Secondary | ICD-10-CM | POA: Diagnosis not present

## 2016-06-19 DIAGNOSIS — Z8051 Family history of malignant neoplasm of kidney: Secondary | ICD-10-CM | POA: Insufficient documentation

## 2016-06-19 DIAGNOSIS — Z885 Allergy status to narcotic agent status: Secondary | ICD-10-CM | POA: Diagnosis not present

## 2016-06-19 DIAGNOSIS — I480 Paroxysmal atrial fibrillation: Secondary | ICD-10-CM | POA: Diagnosis not present

## 2016-06-19 DIAGNOSIS — I495 Sick sinus syndrome: Secondary | ICD-10-CM | POA: Diagnosis not present

## 2016-06-19 DIAGNOSIS — M1990 Unspecified osteoarthritis, unspecified site: Secondary | ICD-10-CM | POA: Insufficient documentation

## 2016-06-19 DIAGNOSIS — I2510 Atherosclerotic heart disease of native coronary artery without angina pectoris: Secondary | ICD-10-CM | POA: Diagnosis not present

## 2016-06-19 DIAGNOSIS — Z90722 Acquired absence of ovaries, bilateral: Secondary | ICD-10-CM | POA: Insufficient documentation

## 2016-06-19 LAB — CBC
HEMATOCRIT: 33.4 % — AB (ref 36.0–46.0)
HEMOGLOBIN: 10.6 g/dL — AB (ref 12.0–15.0)
MCH: 24.3 pg — AB (ref 26.0–34.0)
MCHC: 31.7 g/dL (ref 30.0–36.0)
MCV: 76.4 fL — ABNORMAL LOW (ref 78.0–100.0)
Platelets: 298 10*3/uL (ref 150–400)
RBC: 4.37 MIL/uL (ref 3.87–5.11)
RDW: 17.2 % — AB (ref 11.5–15.5)
WBC: 12 10*3/uL — ABNORMAL HIGH (ref 4.0–10.5)

## 2016-06-19 LAB — GLUCOSE, CAPILLARY: Glucose-Capillary: 74 mg/dL (ref 65–99)

## 2016-06-19 LAB — PROTIME-INR
INR: 1.08
Prothrombin Time: 14.1 seconds (ref 11.4–15.2)

## 2016-06-19 LAB — APTT: aPTT: 27 seconds (ref 24–36)

## 2016-06-19 MED ORDER — MIDAZOLAM HCL 2 MG/2ML IJ SOLN
INTRAMUSCULAR | Status: AC
  Filled 2016-06-19: qty 6

## 2016-06-19 MED ORDER — SODIUM CHLORIDE 0.9 % IV SOLN
Freq: Once | INTRAVENOUS | Status: AC
  Administered 2016-06-19: 12:00:00 via INTRAVENOUS

## 2016-06-19 MED ORDER — MIDAZOLAM HCL 2 MG/2ML IJ SOLN
INTRAMUSCULAR | Status: AC | PRN
  Administered 2016-06-19: 0.5 mg via INTRAVENOUS
  Administered 2016-06-19 (×2): 1 mg via INTRAVENOUS

## 2016-06-19 MED ORDER — FENTANYL CITRATE (PF) 100 MCG/2ML IJ SOLN
INTRAMUSCULAR | Status: AC
  Filled 2016-06-19: qty 4

## 2016-06-19 MED ORDER — FENTANYL CITRATE (PF) 100 MCG/2ML IJ SOLN
INTRAMUSCULAR | Status: AC | PRN
  Administered 2016-06-19: 50 ug via INTRAVENOUS

## 2016-06-19 NOTE — Procedures (Signed)
US guided core biopsy of right renal mass. 3 cores obtained.  Gelfoam injected through coaxial needle for hemostasis.  Minimal blood loss and no immediate complication.  Plan for 4 hours of bedrest.  See full report in PACS.
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
## 2016-06-19 NOTE — Discharge Instructions (Signed)
Needle Biopsy, Care After °These instructions give you information about caring for yourself after your procedure. Your doctor may also give you more specific instructions. Call your doctor if you have any problems or questions after your procedure. °HOME CARE °· Rest as told by your doctor. °· Take medicines only as told by your doctor. °· There are many different ways to close and cover the biopsy site, including stitches (sutures), skin glue, and adhesive strips. Follow instructions from your doctor about: °¨ How to take care of your biopsy site. °¨ When and how you should change your bandage (dressing). °¨ When you should remove your dressing. °¨ Removing whatever was used to close your biopsy site. °· Check your biopsy site every day for signs of infection. Watch for: °¨ Redness, swelling, or pain. °¨ Fluid, blood, or pus. °GET HELP IF: °· You have a fever. °· You have redness, swelling, or pain at the biopsy site, and it lasts longer than a few days. °· You have fluid, blood, or pus coming from the biopsy site. °· You feel sick to your stomach (nauseous). °· You throw up (vomit). °GET HELP RIGHT AWAY IF: °· You are short of breath. °· You have trouble breathing. °· Your chest hurts. °· You feel dizzy or you pass out (faint). °· You have bleeding that does not stop with pressure or a bandage. °· You cough up blood. °· Your belly (abdomen) hurts. °  °This information is not intended to replace advice given to you by your health care provider. Make sure you discuss any questions you have with your health care provider. °  °Document Released: 10/18/2008 Document Revised: 03/22/2015 Document Reviewed: 11/01/2014 °Elsevier Interactive Patient Education ©2016 [employer] °Moderate Conscious Sedation, Adult, Care After °Refer to this sheet in the next few weeks. These instructions provide you with information on caring for yourself after your procedure. Your health care provider may also give you more specific  instructions. Your treatment has been planned according to current medical practices, but problems sometimes occur. Call your health care provider if you have any problems or questions after your procedure. °WHAT TO EXPECT AFTER THE PROCEDURE  °After your procedure: °· You may feel sleepy, clumsy, and have poor balance for several hours. °· Vomiting may occur if you eat too soon after the procedure. °HOME CARE INSTRUCTIONS °· Do not participate in any activities where you could become injured for at least 24 hours. Do not: °· Drive. °· Swim. °· Ride a bicycle. °· Operate heavy machinery. °· Cook. °· Use power tools. °· Climb ladders. °· Work from a high place. °· Do not make important decisions or sign legal documents until you are improved. °· If you vomit, drink water, juice, or soup when you can drink without vomiting. Make sure you have little or no nausea before eating solid foods. °· Only take over-the-counter or prescription medicines for pain, discomfort, or fever as directed by your health care provider. °· Make sure you and your family fully understand everything about the medicines given to you, including what side effects may occur. °· You should not drink alcohol, take sleeping pills, or take medicines that cause drowsiness for at least 24 hours. °· If you smoke, do not smoke without supervision. °· If you are feeling better, you may resume normal activities 24 hours after you were sedated. °· Keep all appointments with your health care provider. °SEEK MEDICAL CARE IF: °· Your skin is pale or bluish in color. °· You   continue to feel nauseous or vomit. °· Your pain is getting worse and is not helped by medicine. °· You have bleeding or swelling. °· You are still sleepy or feeling clumsy after 24 hours. °SEEK IMMEDIATE MEDICAL CARE IF: °· You develop a rash. °· You have difficulty breathing. °· You develop any type of allergic problem. °· You have a fever. °MAKE SURE YOU: °· Understand these  instructions. °· Will watch your condition. °· Will get help right away if you are not doing well or get worse. °  °This information is not intended to replace advice given to you by your health care provider. Make sure you discuss any questions you have with your health care provider. °  °Document Released: 08/26/2013 Document Revised: 11/26/2014 Document Reviewed: 08/26/2013 °Elsevier Interactive Patient Education ©2016 [employer] ° °
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
## 2016-06-25 ENCOUNTER — Other Ambulatory Visit: Payer: Self-pay | Admitting: Radiation Oncology

## 2016-06-25 DIAGNOSIS — C7931 Secondary malignant neoplasm of brain: Secondary | ICD-10-CM

## 2016-06-26 ENCOUNTER — Ambulatory Visit
Admission: RE | Admit: 2016-06-26 | Discharge: 2016-06-26 | Disposition: A | Discharge status: Home / Self Care | Payer: Medicare Other | Source: Ambulatory Visit | Attending: Radiation Oncology | Admitting: Radiation Oncology

## 2016-06-26 ENCOUNTER — Encounter: Payer: Self-pay | Admitting: Radiation Oncology

## 2016-06-26 ENCOUNTER — Other Ambulatory Visit: Payer: Self-pay | Admitting: Radiation Therapy

## 2016-06-26 VITALS — BP 135/57 | HR 75 | Temp 97.6°F | Resp 20 | Ht 66.0 in | Wt 178.9 lb

## 2016-06-26 DIAGNOSIS — E278 Other specified disorders of adrenal gland: Secondary | ICD-10-CM | POA: Diagnosis not present

## 2016-06-26 DIAGNOSIS — R918 Other nonspecific abnormal finding of lung field: Secondary | ICD-10-CM | POA: Diagnosis not present

## 2016-06-26 DIAGNOSIS — C7949 Secondary malignant neoplasm of other parts of nervous system: Secondary | ICD-10-CM | POA: Diagnosis not present

## 2016-06-26 DIAGNOSIS — Z510 Encounter for antineoplastic radiation therapy: Secondary | ICD-10-CM | POA: Insufficient documentation

## 2016-06-26 DIAGNOSIS — M1990 Unspecified osteoarthritis, unspecified site: Secondary | ICD-10-CM | POA: Insufficient documentation

## 2016-06-26 DIAGNOSIS — E785 Hyperlipidemia, unspecified: Secondary | ICD-10-CM | POA: Insufficient documentation

## 2016-06-26 DIAGNOSIS — I252 Old myocardial infarction: Secondary | ICD-10-CM | POA: Insufficient documentation

## 2016-06-26 DIAGNOSIS — M109 Gout, unspecified: Secondary | ICD-10-CM | POA: Insufficient documentation

## 2016-06-26 DIAGNOSIS — Z885 Allergy status to narcotic agent status: Secondary | ICD-10-CM | POA: Insufficient documentation

## 2016-06-26 DIAGNOSIS — Z7901 Long term (current) use of anticoagulants: Secondary | ICD-10-CM | POA: Diagnosis not present

## 2016-06-26 DIAGNOSIS — C7931 Secondary malignant neoplasm of brain: Secondary | ICD-10-CM | POA: Insufficient documentation

## 2016-06-26 DIAGNOSIS — I495 Sick sinus syndrome: Secondary | ICD-10-CM | POA: Insufficient documentation

## 2016-06-26 DIAGNOSIS — I129 Hypertensive chronic kidney disease with stage 1 through stage 4 chronic kidney disease, or unspecified chronic kidney disease: Secondary | ICD-10-CM | POA: Diagnosis not present

## 2016-06-26 DIAGNOSIS — I2510 Atherosclerotic heart disease of native coronary artery without angina pectoris: Secondary | ICD-10-CM | POA: Insufficient documentation

## 2016-06-26 DIAGNOSIS — N189 Chronic kidney disease, unspecified: Secondary | ICD-10-CM | POA: Diagnosis not present

## 2016-06-26 DIAGNOSIS — C641 Malignant neoplasm of right kidney, except renal pelvis: Secondary | ICD-10-CM | POA: Diagnosis not present

## 2016-06-26 LAB — BUN AND CREATININE (CC13)
BUN: 48.3 mg/dL — AB (ref 7.0–26.0)
CREATININE: 1.2 mg/dL — AB (ref 0.6–1.1)
EGFR: 39 mL/min/{1.73_m2} — ABNORMAL LOW (ref >90)

## 2016-06-26 NOTE — Progress Notes (Signed)
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 I left a message on daughter Judy's voicemail offering a genetic counseling referral.  I asked her to call back if patient/family were interested in meeting our counselor.  __________________________________________   Eppie Gibson, MD

## 2016-06-26 NOTE — Progress Notes (Signed)
Please see the Nurse Progress Note in the MD Initial Consult Encounter for this patient. 

## 2016-06-29 ENCOUNTER — Encounter (HOSPITAL_COMMUNITY): Payer: Self-pay

## 2016-06-29 ENCOUNTER — Ambulatory Visit (HOSPITAL_COMMUNITY)
Admission: RE | Admit: 2016-06-29 | Discharge: 2016-06-29 | Disposition: A | Discharge status: Home / Self Care | Payer: Medicare Other | Source: Ambulatory Visit | Attending: Radiation Oncology | Admitting: Radiation Oncology

## 2016-06-29 ENCOUNTER — Telehealth: Payer: Self-pay | Admitting: Genetic Counselor

## 2016-06-29 DIAGNOSIS — C7949 Secondary malignant neoplasm of other parts of nervous system: Secondary | ICD-10-CM | POA: Diagnosis not present

## 2016-06-29 DIAGNOSIS — G939 Disorder of brain, unspecified: Secondary | ICD-10-CM | POA: Diagnosis not present

## 2016-06-29 DIAGNOSIS — C7931 Secondary malignant neoplasm of brain: Secondary | ICD-10-CM | POA: Insufficient documentation

## 2016-06-29 DIAGNOSIS — C719 Malignant neoplasm of brain, unspecified: Secondary | ICD-10-CM | POA: Diagnosis not present

## 2016-06-29 MED ORDER — IOPAMIDOL (ISOVUE-300) INJECTION 61%
INTRAVENOUS | Status: AC
  Administered 2016-06-29: 50 mL
  Filled 2016-06-29: qty 50

## 2016-06-29 NOTE — Telephone Encounter (Signed)
Lt mess for genetic testing appt.

## 2016-07-02 ENCOUNTER — Ambulatory Visit
Admission: RE | Admit: 2016-07-02 | Discharge: 2016-07-02 | Disposition: A | Discharge status: Home / Self Care | Payer: Medicare Other | Source: Ambulatory Visit | Attending: Radiation Oncology | Admitting: Radiation Oncology

## 2016-07-02 DIAGNOSIS — R918 Other nonspecific abnormal finding of lung field: Secondary | ICD-10-CM | POA: Diagnosis not present

## 2016-07-02 DIAGNOSIS — C7931 Secondary malignant neoplasm of brain: Secondary | ICD-10-CM | POA: Diagnosis not present

## 2016-07-02 DIAGNOSIS — M1990 Unspecified osteoarthritis, unspecified site: Secondary | ICD-10-CM | POA: Diagnosis not present

## 2016-07-02 DIAGNOSIS — E278 Other specified disorders of adrenal gland: Secondary | ICD-10-CM | POA: Diagnosis not present

## 2016-07-02 DIAGNOSIS — Z510 Encounter for antineoplastic radiation therapy: Secondary | ICD-10-CM | POA: Diagnosis not present

## 2016-07-02 DIAGNOSIS — C641 Malignant neoplasm of right kidney, except renal pelvis: Secondary | ICD-10-CM | POA: Diagnosis not present

## 2016-07-02 NOTE — Progress Notes (Signed)
   Radiation Oncology         [phone] ________________________________  Name: Brooke Rios MRN: [id number]  Date: 07/02/2016  DOB: [birth year]  SIMULATION AND TREATMENT PLANNING NOTE  Diagnosis:   ICD-9-CM ICD-10-CM   1. Metastatic cancer to brain ([place]) 198.3 C79.31      NARRATIVE:  The patient was brought to the [facility].  Identity was confirmed.  All relevant records and images related to the planned course of therapy were reviewed.  The patient freely provided informed written consent to proceed with treatment after reviewing the details related to the planned course of therapy. The consent form was witnessed and verified by the simulation staff. Intravenous access was established for contrast administration. Then, the patient was set-up in a stable reproducible supine position for radiation therapy.  A relocatable thermoplastic stereotactic head frame was fabricated for precise immobilization.  CT images were obtained.  Surface markings were placed.  The CT images were loaded into the planning software and fused with the patient's targeting CT scan.  Then the target and avoidance structures were contoured.  Treatment planning then occurred.  The radiation prescription was entered and confirmed.  I have requested 3D planning  I have requested a DVH of the following structures: Brain stem, brain, left eye, right eye, lenses, optic chiasm, target volumes, uninvolved brain, and normal tissue.    SPECIAL TREATMENT PROCEDURE:  The planned course of therapy using radiation constitutes a special treatment procedure. Special care is required in the management of this patient for the following reasons:  High dose per fraction requiring special monitoring for increased toxicities of treatment including daily imaging.  The special nature of the planned course of radiotherapy will require increased physician supervision and oversight to ensure patient's safety with optimal  treatment outcomes.  PLAN:  The patient will receive 20 Gy in 1 fraction to the Right frontal 79m lesion. I will use one fraction given that the CT with contrast is providing satisfactory tumor delineation.   ________________________________    SEppie Gibson MD

## 2016-07-03 DIAGNOSIS — E278 Other specified disorders of adrenal gland: Secondary | ICD-10-CM | POA: Diagnosis not present

## 2016-07-03 DIAGNOSIS — C641 Malignant neoplasm of right kidney, except renal pelvis: Secondary | ICD-10-CM | POA: Diagnosis not present

## 2016-07-03 DIAGNOSIS — M1990 Unspecified osteoarthritis, unspecified site: Secondary | ICD-10-CM | POA: Diagnosis not present

## 2016-07-03 DIAGNOSIS — Z510 Encounter for antineoplastic radiation therapy: Secondary | ICD-10-CM | POA: Diagnosis not present

## 2016-07-03 DIAGNOSIS — C7931 Secondary malignant neoplasm of brain: Secondary | ICD-10-CM | POA: Diagnosis not present

## 2016-07-03 DIAGNOSIS — R918 Other nonspecific abnormal finding of lung field: Secondary | ICD-10-CM | POA: Diagnosis not present

## 2016-07-04 ENCOUNTER — Telehealth: Payer: Self-pay | Admitting: Genetic Counselor

## 2016-07-04 NOTE — Telephone Encounter (Signed)
Pt decline genetic testing at this time.

## 2016-07-05 ENCOUNTER — Telehealth: Payer: Self-pay | Admitting: *Deleted

## 2016-07-05 DIAGNOSIS — C7931 Secondary malignant neoplasm of brain: Secondary | ICD-10-CM | POA: Diagnosis not present

## 2016-07-05 DIAGNOSIS — Z6828 Body mass index (BMI) 28.0-28.9, adult: Secondary | ICD-10-CM | POA: Diagnosis not present

## 2016-07-05 NOTE — Telephone Encounter (Signed)
Received call from Kizzie Bane, patients daughter.  States that patient has felt more jittery since she was on the Decadron 8 mg bid.  Dr. Marin Olp decreased the dose to 4 mg bid.

## 2016-07-06 DIAGNOSIS — C641 Malignant neoplasm of right kidney, except renal pelvis: Secondary | ICD-10-CM | POA: Diagnosis not present

## 2016-07-06 DIAGNOSIS — M1990 Unspecified osteoarthritis, unspecified site: Secondary | ICD-10-CM | POA: Diagnosis not present

## 2016-07-06 DIAGNOSIS — Z510 Encounter for antineoplastic radiation therapy: Secondary | ICD-10-CM | POA: Diagnosis not present

## 2016-07-06 DIAGNOSIS — C7931 Secondary malignant neoplasm of brain: Secondary | ICD-10-CM | POA: Diagnosis not present

## 2016-07-06 DIAGNOSIS — R918 Other nonspecific abnormal finding of lung field: Secondary | ICD-10-CM | POA: Diagnosis not present

## 2016-07-06 DIAGNOSIS — E278 Other specified disorders of adrenal gland: Secondary | ICD-10-CM | POA: Diagnosis not present

## 2016-07-09 ENCOUNTER — Encounter: Payer: Self-pay | Admitting: Hematology & Oncology

## 2016-07-09 ENCOUNTER — Other Ambulatory Visit: Payer: Self-pay | Admitting: Radiation Oncology

## 2016-07-09 ENCOUNTER — Encounter: Payer: Self-pay | Admitting: Radiation Therapy

## 2016-07-09 ENCOUNTER — Encounter: Payer: Self-pay | Admitting: Radiation Oncology

## 2016-07-09 ENCOUNTER — Other Ambulatory Visit: Payer: Self-pay

## 2016-07-09 ENCOUNTER — Other Ambulatory Visit: Payer: Self-pay | Admitting: Hematology & Oncology

## 2016-07-09 ENCOUNTER — Ambulatory Visit
Admission: RE | Admit: 2016-07-09 | Discharge: 2016-07-09 | Disposition: A | Discharge status: Home / Self Care | Payer: Medicare Other | Source: Ambulatory Visit | Attending: Radiation Oncology | Admitting: Radiation Oncology

## 2016-07-09 VITALS — BP 122/54 | HR 60 | Temp 97.8°F | Resp 16

## 2016-07-09 DIAGNOSIS — M1990 Unspecified osteoarthritis, unspecified site: Secondary | ICD-10-CM | POA: Diagnosis not present

## 2016-07-09 DIAGNOSIS — C649 Malignant neoplasm of unspecified kidney, except renal pelvis: Secondary | ICD-10-CM

## 2016-07-09 DIAGNOSIS — C7800 Secondary malignant neoplasm of unspecified lung: Secondary | ICD-10-CM

## 2016-07-09 DIAGNOSIS — E278 Other specified disorders of adrenal gland: Secondary | ICD-10-CM | POA: Diagnosis not present

## 2016-07-09 DIAGNOSIS — C641 Malignant neoplasm of right kidney, except renal pelvis: Secondary | ICD-10-CM | POA: Diagnosis not present

## 2016-07-09 DIAGNOSIS — R918 Other nonspecific abnormal finding of lung field: Secondary | ICD-10-CM | POA: Diagnosis not present

## 2016-07-09 DIAGNOSIS — Z923 Personal history of irradiation: Secondary | ICD-10-CM

## 2016-07-09 DIAGNOSIS — Z510 Encounter for antineoplastic radiation therapy: Secondary | ICD-10-CM | POA: Diagnosis not present

## 2016-07-09 DIAGNOSIS — C7931 Secondary malignant neoplasm of brain: Secondary | ICD-10-CM

## 2016-07-09 DIAGNOSIS — C7801 Secondary malignant neoplasm of right lung: Secondary | ICD-10-CM

## 2016-07-09 HISTORY — DX: Malignant neoplasm of unspecified kidney, except renal pelvis: C64.9

## 2016-07-09 HISTORY — DX: Personal history of irradiation: Z92.3

## 2016-07-09 HISTORY — DX: Secondary malignant neoplasm of unspecified lung: C78.00

## 2016-07-09 MED ORDER — LORAZEPAM 0.5 MG PO TABS
0.5000 mg | ORAL_TABLET | Freq: Once | ORAL | Status: DC
  Filled 2016-07-09: qty 1

## 2016-07-09 MED ORDER — LORAZEPAM 0.5 MG PO TABS
0.5000 mg | ORAL_TABLET | Freq: Once | ORAL | Status: AC
  Administered 2016-07-09: 0.5 mg via ORAL
  Filled 2016-07-09: qty 1

## 2016-07-09 NOTE — Progress Notes (Signed)
Brooke Rios tolerated the [facility] treatment very well without complaints. Before leaving she was given a 1 mo follow-up visit to come back and see Dr. Isidore Moos and taper instructions for her steroids.   Taper instructions:   Continue taking 4 mg twice a day until September 8. Then begin 4 mg once per day (in the morning) until September 22nd. Then start taking half a tablet, 2 mg, once per day. When you see Dr. Isidore Moos on 9/29, you will be given further instructions.   Mont Dutton R.T.(R)(T) Special Procedures Navigator

## 2016-07-09 NOTE — Progress Notes (Addendum)
  Radiation Oncology         [phone] ________________________________  Stereotactic Treatment Procedure Note  Name: KHRISTINE VERNO MRN: [id number]  Date: 07/09/2016  DOB: 1925-06-26  SPECIAL TREATMENT PROCEDURE  3D TREATMENT PLANNING AND DOSIMETRY:  The patient's radiation plan was reviewed and approved by neurosurgery and radiation oncology prior to treatment.  It showed 3-dimensional radiation distributions overlaid onto the planning CT/MRI image set.  The [facility] for the target structures as well as the organs at risk were reviewed. The documentation of the 3D plan and dosimetry are filed in the radiation oncology EMR.  NARRATIVE:  LORAE ROIG was brought to the TrueBeam stereotactic radiation treatment machine and placed supine on the CT couch. The head frame was applied, and the patient was set up for stereotactic radiosurgery.  Neurosurgery was present for the set-up and delivery  SIMULATION VERIFICATION:  In the couch zero-angle position, the patient underwent Exactrac imaging using the Brainlab system with orthogonal KV images.  These were carefully aligned and repeated to confirm treatment position for each of the isocenters.  The Exactrac snap film verification was repeated at each couch angle.  SPECIAL TREATMENT PROCEDURE: [name] received stereotactic radiosurgery to the following targets: Right frontal 55m target was treated using 3 Dynamic Conformal Arcs to a prescription dose of 20 Gy prescribed to the 80% isodose line.  ExacTrac Snap verification was performed for each couch angle.   This constitutes a special treatment procedure due to the ablative dose delivered and the technical nature of treatment.  This highly technical modality of treatment ensures that the ablative dose is centered on the patient's tumor while sparing normal tissues from excessive dose and risk of detrimental effects.  STEREOTACTIC TREATMENT MANAGEMENT:  Following delivery, the patient was  transported to nursing in stable condition and monitored for possible acute effects.  Vital signs were recorded BP (!) 122/54 (BP Location: Left Arm, Patient Position: Sitting, Cuff Size: Normal)   Pulse 60   Temp 97.8 F (36.6 C) (Oral)   Resp 16   SpO2 97% . The patient tolerated treatment without significant acute effects, and was discharged to home in stable condition.    PLAN: Follow-up in one month.  Steroid taper given. Refer to genetics per wishes of patient/ family. ________________________________   SEppie Gibson MD

## 2016-07-09 NOTE — Progress Notes (Signed)
[facility] Radiation Oncology End of Treatment Note  Name:Brooke Rios  Date: 07/09/2016 LPN:[id number] DOB:04/03/1925   DIAGNOSIS: brain metastasis, right frontal lobe    INDICATION FOR TREATMENT: Palliative   TREATMENT DATES: 07-09-16                          SITE/DOSE/BEAMS/ENERGY:  Right frontal 59m target was treated using 3 Dynamic Conformal Arcs to a prescription dose of 20 Gy prescribed to the 80% isodose line.  6FFF photons were used.                                 NARRATIVE:   She tolerated treatment well.                     PLAN: Routine followup in one month. Family/patient instructed to call if questions or worsening complaints in interim.  Currently taking '4mg'$  BID of decadron. Taper to '4mg'$  daily on 9-8. On 9-22, taper to '2mg'$  (1/2 tab) daily.    -----------------------------------  SEppie Gibson MD                                        Right frontal target was treated using 3 Dynamic Conformal Arcs to a prescription dose of 20 Gy prescribed to the 80% isodose line.  ExacTrac Snap verification was performed for each couch angle.

## 2016-07-09 NOTE — Progress Notes (Signed)
4:25 p.m. Arrived back from [facility] of brain.  Denied headache or any visual problems.  Refused any thing to drink.  A friend has fixed her dinner and she is looking forward to eating with her friend.   BP (!) 144/61 (BP Location: Left Arm, Patient Position: Sitting, Cuff Size: Normal)   Pulse 77   Temp 97.9 F (36.6 C) (Oral)   Resp 16   SpO2 97%   Manuela Schwartz in to review the steroid instructions handout given to patient and family member. 4:55 p.m.Denied headache or any visual problems. Dr. Isidore Moos in to see prior to discharge home with daughter [name] ambulate to the car with the nurse Rayvion Stumph. Blood pressure (!) 122/54, pulse 60, temperature 97.8 F (36.6 C), temperature source Oral, resp. rate 16, SpO2 97 %.  BP (!) 122/54 (BP Location: Left Arm, Patient Position: Sitting, Cuff Size: Normal)   Pulse 60   Temp 97.8 F (36.6 C) (Oral)   Resp 16   SpO2 97%

## 2016-07-11 NOTE — Addendum Note (Signed)
Encounter addended by: Consuella Lose, MD on: 07/11/2016  8:57 AM<BR>    Actions taken: Sign clinical note

## 2016-07-11 NOTE — Op Note (Signed)
Name: Brooke Rios    MRN: [id number]   Date: 07/09/2016    DOB: [birth year]   STEREOTACTIC RADIOSURGERY OPERATIVE NOTE  PRE-OPERATIVE DIAGNOSIS:  Metastatic clear cell renal carcinoma  POST-OPERATIVE DIAGNOSIS:  Same  PROCEDURE:  Stereotactic Radiosurgery  SURGEON:  Consuella Lose, MD  RADIATION ONCOLOGIST: Dr. Eppie Gibson, MD  TECHNIQUE:  The patient underwent a radiation treatment planning session in the radiation oncology simulation suite under the care of the radiation oncology physician and physicist.  I participated closely in the radiation treatment planning afterwards. The patient underwent planning CT (unable to have MRI due to pacemaker).  We contoured the gross target volumes and subsequently expanded this to yield the Planning Target Volume. I actively participated in the planning process.  I helped to define and review the target contours and also the contours of the optic pathway, eyes, brainstem and selected nearby organs at risk.  All the dose constraints for critical structures were reviewed and compared to AAPM Task Group 101.  The prescription dose conformity was reviewed.  I approved the plan electronically.    Accordingly, Brooke Rios  was brought to the TrueBeam stereotactic radiation treatment linac and placed in the custom immobilization mask.  The patient was aligned according to the IR fiducial markers with BrainLab Exactrac, then orthogonal x-rays were used in ExacTrac with the 6DOF robotic table and the shifts were made to align the patient  Brooke Rios received stereotactic radiosurgery to a prescription dose of 20Gy uneventfully to the right frontal lesion.  The detailed description of the procedure is recorded in the radiation oncology procedure note.  I was present for the duration of the procedure.  DISPOSITION:   Following delivery, the patient was transported to nursing in stable condition and monitored for possible acute effects to be discharged  to home in stable condition with follow-up in one month.  Consuella Lose, MD [facility]

## 2016-07-16 ENCOUNTER — Encounter: Payer: Self-pay | Admitting: Nurse Practitioner

## 2016-07-16 ENCOUNTER — Other Ambulatory Visit (HOSPITAL_BASED_OUTPATIENT_CLINIC_OR_DEPARTMENT_OTHER): Payer: Medicare Other

## 2016-07-16 ENCOUNTER — Encounter: Payer: Self-pay | Admitting: Hematology & Oncology

## 2016-07-16 ENCOUNTER — Ambulatory Visit (HOSPITAL_BASED_OUTPATIENT_CLINIC_OR_DEPARTMENT_OTHER): Payer: Medicare Other | Admitting: Hematology & Oncology

## 2016-07-16 VITALS — BP 116/50 | HR 83 | Temp 97.4°F | Resp 16 | Ht 66.0 in | Wt 179.0 lb

## 2016-07-16 DIAGNOSIS — C7800 Secondary malignant neoplasm of unspecified lung: Secondary | ICD-10-CM | POA: Diagnosis not present

## 2016-07-16 DIAGNOSIS — C641 Malignant neoplasm of right kidney, except renal pelvis: Secondary | ICD-10-CM

## 2016-07-16 DIAGNOSIS — C649 Malignant neoplasm of unspecified kidney, except renal pelvis: Secondary | ICD-10-CM

## 2016-07-16 DIAGNOSIS — C7931 Secondary malignant neoplasm of brain: Secondary | ICD-10-CM | POA: Diagnosis not present

## 2016-07-16 DIAGNOSIS — C7801 Secondary malignant neoplasm of right lung: Principal | ICD-10-CM

## 2016-07-16 LAB — CBC WITH DIFFERENTIAL (CANCER CENTER ONLY)
BASO#: 0 10*3/uL (ref 0.0–0.2)
BASO%: 0.1 % (ref 0.0–2.0)
EOS ABS: 0 10*3/uL (ref 0.0–0.5)
EOS%: 0.3 % (ref 0.0–7.0)
HEMATOCRIT: 32.3 % — AB (ref 34.8–46.6)
HEMOGLOBIN: 10.3 g/dL — AB (ref 11.6–15.9)
LYMPH#: 1.2 10*3/uL (ref 0.9–3.3)
LYMPH%: 13.6 % — ABNORMAL LOW (ref 14.0–48.0)
MCH: 24.7 pg — AB (ref 26.0–34.0)
MCHC: 31.9 g/dL — ABNORMAL LOW (ref 32.0–36.0)
MCV: 78 fL — AB (ref 81–101)
MONO#: 0.6 10*3/uL (ref 0.1–0.9)
MONO%: 6.8 % (ref 0.0–13.0)
NEUT#: 7 10*3/uL — ABNORMAL HIGH (ref 1.5–6.5)
NEUT%: 79.2 % (ref 39.6–80.0)
Platelets: 172 10*3/uL (ref 145–400)
RBC: 4.17 10*6/uL (ref 3.70–5.32)
RDW: 18.3 % — ABNORMAL HIGH (ref 11.1–15.7)
WBC: 8.8 10*3/uL (ref 3.9–10.0)

## 2016-07-16 LAB — CMP (CANCER CENTER ONLY)
ALBUMIN: 2.6 g/dL — AB (ref 3.3–5.5)
ALK PHOS: 76 U/L (ref 26–84)
ALT: 28 U/L (ref 10–47)
AST: 15 U/L (ref 11–38)
BUN: 44 mg/dL — AB (ref 7–22)
CALCIUM: 9.1 mg/dL (ref 8.0–10.3)
CHLORIDE: 101 meq/L (ref 98–108)
CO2: 30 mEq/L (ref 18–33)
Creat: 1.7 mg/dl — ABNORMAL HIGH (ref 0.6–1.2)
Glucose, Bld: 82 mg/dL (ref 73–118)
POTASSIUM: 5.1 meq/L — AB (ref 3.3–4.7)
Sodium: 135 mEq/L (ref 128–145)
TOTAL PROTEIN: 6 g/dL — AB (ref 6.4–8.1)
Total Bilirubin: 0.7 mg/dl (ref 0.20–1.60)

## 2016-07-16 LAB — LACTATE DEHYDROGENASE: LDH: 249 U/L — AB (ref 125–245)

## 2016-07-16 MED ORDER — TRAMADOL HCL 50 MG PO TABS: 50.0000 mg | ORAL_TABLET | Freq: Four times a day (QID) | ORAL | 1 refills | Status: DC | PRN

## 2016-07-16 MED ORDER — PAZOPANIB HCL 200 MG PO TABS: ORAL_TABLET | ORAL | 3 refills | Status: DC
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
## 2016-07-17 ENCOUNTER — Telehealth: Payer: Self-pay | Admitting: Cardiology

## 2016-07-17 ENCOUNTER — Encounter: Payer: Self-pay | Admitting: Hematology & Oncology

## 2016-07-17 ENCOUNTER — Ambulatory Visit (INDEPENDENT_AMBULATORY_CARE_PROVIDER_SITE_OTHER): Payer: Medicare Other | Admitting: *Deleted

## 2016-07-17 DIAGNOSIS — I495 Sick sinus syndrome: Secondary | ICD-10-CM

## 2016-07-17 DIAGNOSIS — Z950 Presence of cardiac pacemaker: Secondary | ICD-10-CM

## 2016-07-17 NOTE — Telephone Encounter (Signed)
LMOVM reminding pt to send remote transmission.   

## 2016-07-18 NOTE — Progress Notes (Signed)
Remote pacemaker transmission.   

## 2016-07-19 ENCOUNTER — Encounter: Payer: Self-pay | Admitting: Cardiology

## 2016-07-24 ENCOUNTER — Telehealth: Payer: Self-pay | Admitting: *Deleted

## 2016-07-24 ENCOUNTER — Other Ambulatory Visit: Payer: Self-pay | Admitting: *Deleted

## 2016-07-24 DIAGNOSIS — C7801 Secondary malignant neoplasm of right lung: Principal | ICD-10-CM

## 2016-07-24 DIAGNOSIS — C649 Malignant neoplasm of unspecified kidney, except renal pelvis: Secondary | ICD-10-CM

## 2016-07-24 MED ORDER — PAZOPANIB HCL 200 MG PO TABS: ORAL_TABLET | ORAL | 3 refills | Status: DC

## 2016-07-24 NOTE — Telephone Encounter (Signed)
Received call from Brooke Rios patients daughter asking about the status of the Votrient.  Patient was approved for medication but medication still downstairs in the pharmacy, needs to be sent to a specialty pharmacy.  Rx sent to [facility] at [phone].  Daughter notified of change in pharmacy.  [facility] will call her today or tomorrow.

## 2016-07-27 LAB — CUP PACEART REMOTE DEVICE CHECK
Battery Impedance: 346 Ohm
Battery Voltage: 2.79 V
Brady Statistic AP VS Percent: 85 %
Brady Statistic AS VP Percent: 0 %
Implantable Lead Implant Date: 20110608
Implantable Lead Model: 4470
Implantable Lead Model: 4471
Implantable Lead Serial Number: 480470
Implantable Lead Serial Number: 672291
Lead Channel Impedance Value: 392 Ohm
Lead Channel Impedance Value: 436 Ohm
Lead Channel Pacing Threshold Pulse Width: 0.4 ms
Lead Channel Pacing Threshold Pulse Width: 0.4 ms
Lead Channel Setting Pacing Amplitude: 2.5 V
Lead Channel Setting Sensing Sensitivity: 2.8 mV
MDC IDC LEAD IMPLANT DT: 20110608
MDC IDC LEAD LOCATION: 753859
MDC IDC LEAD LOCATION: 753860
MDC IDC MSMT BATTERY REMAINING LONGEVITY: 99 mo
MDC IDC MSMT LEADCHNL RA PACING THRESHOLD AMPLITUDE: 0.5 V
MDC IDC MSMT LEADCHNL RV PACING THRESHOLD AMPLITUDE: 0.5 V
MDC IDC MSMT LEADCHNL RV SENSING INTR AMPL: 5.6 mV
MDC IDC SESS DTM: 20170830121050
MDC IDC SET LEADCHNL RA PACING AMPLITUDE: 2 V
MDC IDC SET LEADCHNL RV PACING PULSEWIDTH: 0.4 ms
MDC IDC STAT BRADY AP VP PERCENT: 4 %
MDC IDC STAT BRADY AS VS PERCENT: 10 %

## 2016-07-31 ENCOUNTER — Telehealth: Payer: Self-pay | Admitting: Cardiology

## 2016-07-31 NOTE — Telephone Encounter (Signed)
Pt c/o Shortness Of Breath: STAT if SOB developed within the last 24 hours or pt is noticeably SOB on the phone  1. Are you currently SOB (can you hear that pt is SOB on the phone)? No, pt daughter  2. How long have you been experiencing SOB? 3 weeks ago  3. Are you SOB when sitting or when up moving around? both  4. Are you currently experiencing any other symptoms? Lack of energy
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
## 2016-08-08 ENCOUNTER — Ambulatory Visit (HOSPITAL_BASED_OUTPATIENT_CLINIC_OR_DEPARTMENT_OTHER): Payer: Medicare Other

## 2016-08-08 ENCOUNTER — Encounter: Payer: Self-pay | Admitting: Hematology & Oncology

## 2016-08-08 ENCOUNTER — Other Ambulatory Visit (HOSPITAL_BASED_OUTPATIENT_CLINIC_OR_DEPARTMENT_OTHER): Payer: Medicare Other

## 2016-08-08 ENCOUNTER — Ambulatory Visit (HOSPITAL_BASED_OUTPATIENT_CLINIC_OR_DEPARTMENT_OTHER): Payer: Medicare Other | Admitting: Hematology & Oncology

## 2016-08-08 VITALS — BP 122/61 | HR 84 | Temp 97.3°F | Resp 18 | Ht 66.0 in | Wt 177.1 lb

## 2016-08-08 DIAGNOSIS — Z23 Encounter for immunization: Secondary | ICD-10-CM | POA: Diagnosis not present

## 2016-08-08 DIAGNOSIS — C7931 Secondary malignant neoplasm of brain: Secondary | ICD-10-CM

## 2016-08-08 DIAGNOSIS — C7801 Secondary malignant neoplasm of right lung: Principal | ICD-10-CM

## 2016-08-08 DIAGNOSIS — D649 Anemia, unspecified: Secondary | ICD-10-CM

## 2016-08-08 DIAGNOSIS — C641 Malignant neoplasm of right kidney, except renal pelvis: Secondary | ICD-10-CM

## 2016-08-08 DIAGNOSIS — D509 Iron deficiency anemia, unspecified: Secondary | ICD-10-CM

## 2016-08-08 DIAGNOSIS — C649 Malignant neoplasm of unspecified kidney, except renal pelvis: Secondary | ICD-10-CM

## 2016-08-08 DIAGNOSIS — C7800 Secondary malignant neoplasm of unspecified lung: Secondary | ICD-10-CM | POA: Diagnosis not present

## 2016-08-08 LAB — COMPREHENSIVE METABOLIC PANEL
ALT: 26 U/L (ref 0–55)
ANION GAP: 12 meq/L — AB (ref 3–11)
AST: 11 U/L (ref 5–34)
Albumin: 2.5 g/dL — ABNORMAL LOW (ref 3.5–5.0)
Alkaline Phosphatase: 99 U/L (ref 40–150)
BILIRUBIN TOTAL: 0.52 mg/dL (ref 0.20–1.20)
BUN: 40.4 mg/dL — ABNORMAL HIGH (ref 7.0–26.0)
CALCIUM: 9.6 mg/dL (ref 8.4–10.4)
CHLORIDE: 102 meq/L (ref 98–109)
CO2: 23 meq/L (ref 22–29)
CREATININE: 1.4 mg/dL — AB (ref 0.6–1.1)
EGFR: 34 mL/min/{1.73_m2} — AB (ref >90)
Glucose: 223 mg/dl — ABNORMAL HIGH (ref 70–140)
Potassium: 4.2 mEq/L (ref 3.5–5.1)
Sodium: 137 mEq/L (ref 136–145)
TOTAL PROTEIN: 6.4 g/dL (ref 6.4–8.3)

## 2016-08-08 LAB — CBC WITH DIFFERENTIAL (CANCER CENTER ONLY)
BASO#: 0 10*3/uL (ref 0.0–0.2)
BASO%: 0.3 % (ref 0.0–2.0)
EOS%: 1.5 % (ref 0.0–7.0)
Eosinophils Absolute: 0.1 10*3/uL (ref 0.0–0.5)
HCT: 30.8 % — ABNORMAL LOW (ref 34.8–46.6)
HGB: 9.8 g/dL — ABNORMAL LOW (ref 11.6–15.9)
LYMPH#: 1.5 10*3/uL (ref 0.9–3.3)
LYMPH%: 20.1 % (ref 14.0–48.0)
MCH: 24.4 pg — ABNORMAL LOW (ref 26.0–34.0)
MCHC: 31.8 g/dL — AB (ref 32.0–36.0)
MCV: 77 fL — AB (ref 81–101)
MONO#: 0.4 10*3/uL (ref 0.1–0.9)
MONO%: 5.1 % (ref 0.0–13.0)
NEUT#: 5.4 10*3/uL (ref 1.5–6.5)
NEUT%: 73 % (ref 39.6–80.0)
PLATELETS: 226 10*3/uL (ref 145–400)
RBC: 4.02 10*6/uL (ref 3.70–5.32)
RDW: 18 % — AB (ref 11.1–15.7)
WBC: 7.4 10*3/uL (ref 3.9–10.0)

## 2016-08-08 LAB — IRON AND TIBC
%SAT: 22 % (ref 21–57)
IRON: 62 ug/dL (ref 41–142)
TIBC: 278 ug/dL (ref 236–444)
UIBC: 216 ug/dL (ref 120–384)

## 2016-08-08 LAB — FERRITIN: FERRITIN: 530 ng/mL — AB (ref 9–269)

## 2016-08-08 LAB — LACTATE DEHYDROGENASE: LDH: 291 U/L — ABNORMAL HIGH (ref 125–245)

## 2016-08-08 MED ORDER — INFLUENZA VAC SPLIT QUAD 0.5 ML IM SUSY
0.5000 mL | PREFILLED_SYRINGE | Freq: Once | INTRAMUSCULAR | Status: AC
  Administered 2016-08-08: 0.5 mL via INTRAMUSCULAR
  Filled 2016-08-08: qty 0.5

## 2016-08-08 NOTE — Patient Instructions (Signed)
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
## 2016-08-09 ENCOUNTER — Other Ambulatory Visit: Payer: Self-pay | Admitting: *Deleted

## 2016-08-09 DIAGNOSIS — C7931 Secondary malignant neoplasm of brain: Principal | ICD-10-CM

## 2016-08-09 DIAGNOSIS — C649 Malignant neoplasm of unspecified kidney, except renal pelvis: Secondary | ICD-10-CM

## 2016-08-15 ENCOUNTER — Encounter: Payer: Self-pay | Admitting: Radiation Oncology

## 2016-08-15 ENCOUNTER — Ambulatory Visit (HOSPITAL_BASED_OUTPATIENT_CLINIC_OR_DEPARTMENT_OTHER): Payer: Medicare Other

## 2016-08-15 DIAGNOSIS — C7931 Secondary malignant neoplasm of brain: Principal | ICD-10-CM

## 2016-08-15 DIAGNOSIS — C649 Malignant neoplasm of unspecified kidney, except renal pelvis: Secondary | ICD-10-CM

## 2016-08-15 DIAGNOSIS — D509 Iron deficiency anemia, unspecified: Secondary | ICD-10-CM | POA: Diagnosis not present

## 2016-08-15 MED ORDER — SODIUM CHLORIDE 0.9 % IV SOLN
510.0000 mg | Freq: Once | INTRAVENOUS | Status: AC
  Administered 2016-08-15: 510 mg via INTRAVENOUS
  Filled 2016-08-15: qty 17

## 2016-08-15 MED ORDER — SODIUM CHLORIDE 0.9 % IV SOLN
Freq: Once | INTRAVENOUS | Status: AC
  Administered 2016-08-15: 14:00:00 via INTRAVENOUS

## 2016-08-15 NOTE — Patient Instructions (Signed)

## 2016-08-17 ENCOUNTER — Encounter: Payer: Self-pay | Admitting: Radiation Oncology

## 2016-08-17 ENCOUNTER — Encounter: Payer: Self-pay | Admitting: Cardiology

## 2016-08-17 ENCOUNTER — Ambulatory Visit (INDEPENDENT_AMBULATORY_CARE_PROVIDER_SITE_OTHER): Payer: Medicare Other | Admitting: Cardiology

## 2016-08-17 ENCOUNTER — Ambulatory Visit
Admission: RE | Admit: 2016-08-17 | Discharge: 2016-08-17 | Disposition: A | Discharge status: Home / Self Care | Payer: Medicare Other | Source: Ambulatory Visit | Attending: Radiation Oncology | Admitting: Radiation Oncology

## 2016-08-17 VITALS — BP 134/70 | HR 79 | Ht 66.0 in | Wt 178.0 lb

## 2016-08-17 DIAGNOSIS — C649 Malignant neoplasm of unspecified kidney, except renal pelvis: Secondary | ICD-10-CM | POA: Insufficient documentation

## 2016-08-17 DIAGNOSIS — I480 Paroxysmal atrial fibrillation: Secondary | ICD-10-CM | POA: Diagnosis not present

## 2016-08-17 DIAGNOSIS — I10 Essential (primary) hypertension: Secondary | ICD-10-CM | POA: Diagnosis not present

## 2016-08-17 DIAGNOSIS — I2510 Atherosclerotic heart disease of native coronary artery without angina pectoris: Secondary | ICD-10-CM | POA: Diagnosis not present

## 2016-08-17 DIAGNOSIS — C7931 Secondary malignant neoplasm of brain: Secondary | ICD-10-CM | POA: Insufficient documentation

## 2016-08-17 DIAGNOSIS — C7801 Secondary malignant neoplasm of right lung: Secondary | ICD-10-CM

## 2016-08-17 HISTORY — DX: Personal history of irradiation: Z92.3
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
## 2016-08-17 NOTE — Progress Notes (Signed)
Radiation Oncology         [phone] ________________________________  Name: Brooke Rios MRN: [id number]  Date: 08/17/2016  DOB: May 30, 1925  Follow-Up Visit Note  Outpatient  CC: Brooke Blinks, MD  Rios, Brooke Filler, MD  Diagnosis and Prior Radiotherapy:    ICD-9-CM ICD-10-CM   1. Metastatic renal cell carcinoma to brain ([facility]CC) 198.3 C79.31    189.0 C64.9     Metastatic cancer to the brain, renal cell primary   TREATMENT DATES: 07-09-16                          SITE/DOSE/BEAMS/ENERGY:  Right frontal 57m target was treated using 3 Dynamic Conformal Arcs to a prescription dose of 20 Gy prescribed to the 80% isodose line.  6FFF photons were used.                                 Narrative:  The patient returns today for routine follow-up. [name] pain. She does have some fatigue and "wears out easily". She had an infusion of iron per her daughter at Dr. EAntonieta Pertoffice this past Wednesday 9/27. She is eating well. She denies headaches or nausea. She does have daily episodes of drooling from the left side of her mouth. She is taking Votrient 400 mg by mouth daily prescribed by Dr. EMarin Rios       He stopped her Decadron taper and she is off it now                       ALLERGIES:  is allergic to cefpodoxime and codeine.  Meds: Current Outpatient Prescriptions  Medication Sig Dispense Refill  . acetaminophen (TYLENOL) 500 MG tablet Take 500 mg by mouth every 6 (six) hours as needed.    .Marland Kitchenapixaban (ELIQUIS) 2.5 MG TABS tablet Take 1 tablet (2.5 mg total) by mouth 2 (two) times daily. 180 tablet 3  . docusate sodium (COLACE) 100 MG capsule Take 100 mg by mouth 2 (two) times daily.    . isosorbide mononitrate (IMDUR) 30 MG 24 hr tablet Take 1 tablet (30 mg total) by mouth 2 (two) times daily. Take 1 tab twice daily 90 tablet 3  . metoprolol succinate (TOPROL-XL) 50 MG 24 hr tablet Take 1 tablet (50 mg total) by mouth every morning. Take with or immediately following a  meal. 90 tablet 3  . nitroGLYCERIN (NITROSTAT) 0.4 MG SL tablet Place 1 tablet (0.4 mg total) under the tongue every 5 (five) minutes as needed for chest pain (MAX 3 TABLETS). 25 tablet 1  . pazopanib (VOTRIENT) 200 MG tablet Take 2 pills a day on an empty stomach 60 tablet 3  . Polyethylene Glycol 3350 (MIRALAX PO) Take by mouth. Takes every other day    . traMADol (ULTRAM) 50 MG tablet Take 1 tablet (50 mg total) by mouth every 6 (six) hours as needed. (Patient not taking: Reported on 08/17/2016) 90 tablet 1   No current facility-administered medications for this encounter.     Physical Findings: The patient is in no acute distress. Patient is alert and oriented.  height is '5\' 6"'$  (1.676 m) and weight is 181 lb 11.2 oz (82.4 kg). Her temperature is 97.9 F (36.6 C). Her blood pressure is 142/54 (abnormal) and her pulse is 70. Her oxygen saturation is 94%. .  General: Alert and oriented, in no acute  distress HEENT: Head is normocephalic. Extraocular movements are intact. Oropharynx is clear. No oral thrush. No numbness or weakness in her face. Neck: Neck is supple, no palpable cervical or supraclavicular lymphadenopathy. Extremities: No cyanosis or edema. Lymphatics: see Neck Exam Skin: No concerning lesions. Dryness over skin of legs and feet. Musculoskeletal: symmetric strength and muscle tone throughout.  Neurologic: Cranial nerves II through XII are grossly intact. No obvious focalities. Speech is fluent. Rapidly alternating movements intact. Coordination is intact. Psychiatric: Judgment and insight are intact. Affect is appropriate.      Lab Findings: Lab Results  Component Value Date   WBC 7.4 08/08/2016   HGB 9.8 (L) 08/08/2016   HCT 30.8 (L) 08/08/2016   MCV 77 (L) 08/08/2016   PLT 226 08/08/2016    Radiographic Findings: No results found.  Impression/Plan:  Healing well from radiosurgery. CT scan of head in 2 months and she will follow up with neurosurgery at that time.    _____________________________________   Brooke Gibson, MD This document serves as a record of services personally performed by Brooke Gibson, MD. It was created on her behalf by Brooke Rios, a trained medical scribe. The creation of this record is based on the scribe's personal observations and the provider's statements to them. This document has been checked and approved by the attending provider.

## 2016-08-17 NOTE — Progress Notes (Signed)
Brooke Rios is here for follow up of radiation completed 07/09/16 to her Right Frontal Brain. She denies pain. She does have some fatigue and "wears out easily". She had an infusion of iron per her daughter at Dr. Antonieta Pert office this past Wednesday 9/27. She is eating well. She denies headaches or nausea. She does have daily episodes of drooling from the left side of her mouth. She is taking Votrient 400 mg by mouth daily prescribed by Dr. Marin Olp.   BP (!) 142/54   Pulse 70   Temp 97.9 F (36.6 C)   Ht '5\' 6"'$  (1.676 m)   Wt 181 lb 11.2 oz (82.4 kg)   SpO2 94% Comment: room air  BMI 29.33 kg/m    Wt Readings from Last 3 Encounters:  08/17/16 181 lb 11.2 oz (82.4 kg)  08/17/16 178 lb (80.7 kg)  08/08/16 177 lb 1.9 oz (80.3 kg)

## 2016-08-17 NOTE — Patient Instructions (Signed)
Stop taking benazepril  Continue your other therapy  We will monitor your blood pressure closely  We will see you in December

## 2016-08-20 ENCOUNTER — Other Ambulatory Visit: Payer: Self-pay | Admitting: Cardiology

## 2016-08-20 NOTE — Telephone Encounter (Signed)
REFILL 

## 2016-08-21 ENCOUNTER — Telehealth: Payer: Self-pay | Admitting: Hematology & Oncology

## 2016-08-21 DIAGNOSIS — H25013 Cortical age-related cataract, bilateral: Secondary | ICD-10-CM | POA: Diagnosis not present

## 2016-08-21 NOTE — Telephone Encounter (Signed)
Patient's daughter called to cx apt.  I called daughter back and cx 09/05/16 and resch for 09/24/16

## 2016-08-29 ENCOUNTER — Encounter: Payer: Self-pay | Admitting: Podiatry

## 2016-08-29 ENCOUNTER — Ambulatory Visit (INDEPENDENT_AMBULATORY_CARE_PROVIDER_SITE_OTHER): Payer: Medicare Other | Admitting: Podiatry

## 2016-08-29 VITALS — BP 153/75 | HR 66 | Resp 14

## 2016-08-29 DIAGNOSIS — Q828 Other specified congenital malformations of skin: Secondary | ICD-10-CM | POA: Diagnosis not present

## 2016-08-29 DIAGNOSIS — I2583 Coronary atherosclerosis due to lipid rich plaque: Secondary | ICD-10-CM

## 2016-08-29 DIAGNOSIS — I2510 Atherosclerotic heart disease of native coronary artery without angina pectoris: Secondary | ICD-10-CM

## 2016-08-29 NOTE — Progress Notes (Signed)
   Subjective:    Patient ID: Brooke Rios, female    DOB: 1925/08/16, 82 y.o.   MRN: [id number]  HPI this patient presents to the office with chief complaint of painful calluses on the bottom of both feet. She says she has pain when she walks and wears her shoes. She says she was having her nails and callus done at a salon, but she feels they got too close and caused more pain. Therefore, she comes the office today for an evaluation and treatment of her painful callus. Patient is also a cancer patient    Review of Systems  All other systems reviewed and are negative.      Objective:   Physical Exam GENERAL APPEARANCE: Alert, conversant. Appropriately groomed. No acute distress.  VASCULAR: Pedal pulses are  palpable at  [facility] and PT bilateral.  Capillary refill time is immediate to all digits,  Normal temperature gradient.   NEUROLOGIC: sensation is normal to 5.07 monofilament at 5/5 sites bilateral.  Light touch is intact bilateral, Muscle strength normal.  MUSCULOSKELETAL: acceptable muscle strength, tone and stability bilateral.  Intrinsic muscluature intact bilateral.  Rectus appearance of foot and digits noted bilateral.   DERMATOLOGIC: skin color, texture, and turgor are within normal limits.  No preulcerative lesions or ulcers  are seen, no interdigital maceration noted.  No open lesions present.  Pincer toenail right foot.. No drainage noted.Porokeratosis noted sub1,5 left foot and sub 5 right foot.         Assessment & Plan:  Porokeratosis  B/L  IE  Debridement of porokeratosis.  RTC 10 weeks prn.   Gardiner Barefoot DPM

## 2016-09-05 ENCOUNTER — Other Ambulatory Visit: Payer: Medicare Other

## 2016-09-05 ENCOUNTER — Ambulatory Visit: Payer: Medicare Other | Admitting: Hematology & Oncology

## 2016-09-11 ENCOUNTER — Other Ambulatory Visit: Payer: Self-pay | Admitting: Radiation Therapy

## 2016-09-11 DIAGNOSIS — C7949 Secondary malignant neoplasm of other parts of nervous system: Principal | ICD-10-CM

## 2016-09-11 DIAGNOSIS — C7931 Secondary malignant neoplasm of brain: Secondary | ICD-10-CM

## 2016-09-24 ENCOUNTER — Other Ambulatory Visit (HOSPITAL_BASED_OUTPATIENT_CLINIC_OR_DEPARTMENT_OTHER): Payer: Medicare Other

## 2016-09-24 ENCOUNTER — Ambulatory Visit (HOSPITAL_BASED_OUTPATIENT_CLINIC_OR_DEPARTMENT_OTHER): Payer: Medicare Other | Admitting: Hematology & Oncology

## 2016-09-24 VITALS — BP 140/76 | HR 91 | Temp 97.4°F | Resp 16 | Ht 66.0 in | Wt 180.0 lb

## 2016-09-24 DIAGNOSIS — D509 Iron deficiency anemia, unspecified: Secondary | ICD-10-CM | POA: Diagnosis not present

## 2016-09-24 DIAGNOSIS — L989 Disorder of the skin and subcutaneous tissue, unspecified: Secondary | ICD-10-CM

## 2016-09-24 DIAGNOSIS — C649 Malignant neoplasm of unspecified kidney, except renal pelvis: Secondary | ICD-10-CM

## 2016-09-24 DIAGNOSIS — C7801 Secondary malignant neoplasm of right lung: Secondary | ICD-10-CM | POA: Diagnosis not present

## 2016-09-24 DIAGNOSIS — C7931 Secondary malignant neoplasm of brain: Secondary | ICD-10-CM | POA: Diagnosis not present

## 2016-09-24 LAB — COMPREHENSIVE METABOLIC PANEL (CC13)
A/G RATIO: 1.4 (ref 1.2–2.2)
ALK PHOS: 106 IU/L (ref 39–117)
ALT: 57 IU/L — AB (ref 0–32)
AST: 38 IU/L (ref 0–40)
Albumin, Serum: 3.7 g/dL (ref 3.5–4.7)
BILIRUBIN TOTAL: 0.5 mg/dL (ref 0.0–1.2)
BUN/Creatinine Ratio: 16 (ref 12–28)
BUN: 23 mg/dL (ref 8–27)
CHLORIDE: 95 mmol/L — AB (ref 96–106)
Calcium, Ser: 9.6 mg/dL (ref 8.7–10.3)
Carbon Dioxide, Total: 27 mmol/L (ref 18–29)
Creatinine, Ser: 1.41 mg/dL — ABNORMAL HIGH (ref 0.57–1.00)
GFR calc non Af Amer: 33 mL/min/{1.73_m2} — ABNORMAL LOW (ref >59)
GFR, EST AFRICAN AMERICAN: 38 mL/min/{1.73_m2} — AB (ref >59)
GLUCOSE: 146 mg/dL — AB (ref 65–99)
Globulin, Total: 2.7 g/dL (ref 1.5–4.5)
POTASSIUM: 4.3 mmol/L (ref 3.5–5.2)
Sodium: 127 mmol/L — ABNORMAL LOW (ref 134–144)
TOTAL PROTEIN: 6.4 g/dL (ref 6.0–8.5)

## 2016-09-24 LAB — CBC WITH DIFFERENTIAL (CANCER CENTER ONLY)
BASO#: 0 10*3/uL (ref 0.0–0.2)
BASO%: 0.2 % (ref 0.0–2.0)
EOS ABS: 0.1 10*3/uL (ref 0.0–0.5)
EOS%: 1.1 % (ref 0.0–7.0)
HCT: 36.5 % (ref 34.8–46.6)
HGB: 11.9 g/dL (ref 11.6–15.9)
LYMPH#: 1.6 10*3/uL (ref 0.9–3.3)
LYMPH%: 29.7 % (ref 14.0–48.0)
MCH: 28.8 pg (ref 26.0–34.0)
MCHC: 32.6 g/dL (ref 32.0–36.0)
MCV: 88 fL (ref 81–101)
MONO#: 0.4 10*3/uL (ref 0.1–0.9)
MONO%: 6.5 % (ref 0.0–13.0)
NEUT#: 3.4 10*3/uL (ref 1.5–6.5)
NEUT%: 62.5 % (ref 39.6–80.0)
PLATELETS: 154 10*3/uL (ref 145–400)
RBC: 4.13 10*6/uL (ref 3.70–5.32)
RDW: 22.9 % — AB (ref 11.1–15.7)
WBC: 5.4 10*3/uL (ref 3.9–10.0)
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
## 2016-09-26 DIAGNOSIS — M1712 Unilateral primary osteoarthritis, left knee: Secondary | ICD-10-CM | POA: Diagnosis not present

## 2016-09-28 DIAGNOSIS — D485 Neoplasm of uncertain behavior of skin: Secondary | ICD-10-CM | POA: Diagnosis not present

## 2016-09-28 DIAGNOSIS — C44722 Squamous cell carcinoma of skin of right lower limb, including hip: Secondary | ICD-10-CM | POA: Diagnosis not present

## 2016-09-28 DIAGNOSIS — L821 Other seborrheic keratosis: Secondary | ICD-10-CM | POA: Diagnosis not present

## 2016-10-04 ENCOUNTER — Ambulatory Visit (HOSPITAL_COMMUNITY)
Admission: RE | Admit: 2016-10-04 | Discharge: 2016-10-04 | Disposition: A | Discharge status: Home / Self Care | Payer: Medicare Other | Source: Ambulatory Visit | Attending: Radiation Oncology | Admitting: Radiation Oncology

## 2016-10-04 ENCOUNTER — Encounter (HOSPITAL_COMMUNITY): Payer: Self-pay

## 2016-10-04 DIAGNOSIS — C7949 Secondary malignant neoplasm of other parts of nervous system: Principal | ICD-10-CM

## 2016-10-04 DIAGNOSIS — I480 Paroxysmal atrial fibrillation: Secondary | ICD-10-CM | POA: Diagnosis not present

## 2016-10-04 DIAGNOSIS — S0993XA Unspecified injury of face, initial encounter: Secondary | ICD-10-CM | POA: Diagnosis not present

## 2016-10-04 DIAGNOSIS — S0181XA Laceration without foreign body of other part of head, initial encounter: Secondary | ICD-10-CM | POA: Diagnosis not present

## 2016-10-04 DIAGNOSIS — S0990XA Unspecified injury of head, initial encounter: Secondary | ICD-10-CM | POA: Diagnosis not present

## 2016-10-04 DIAGNOSIS — C719 Malignant neoplasm of brain, unspecified: Secondary | ICD-10-CM | POA: Diagnosis not present

## 2016-10-04 DIAGNOSIS — S299XXA Unspecified injury of thorax, initial encounter: Secondary | ICD-10-CM | POA: Diagnosis not present

## 2016-10-04 DIAGNOSIS — C7800 Secondary malignant neoplasm of unspecified lung: Secondary | ICD-10-CM | POA: Diagnosis not present

## 2016-10-04 DIAGNOSIS — C7931 Secondary malignant neoplasm of brain: Secondary | ICD-10-CM | POA: Diagnosis not present

## 2016-10-04 DIAGNOSIS — S0101XA Laceration without foreign body of scalp, initial encounter: Secondary | ICD-10-CM | POA: Diagnosis not present

## 2016-10-04 DIAGNOSIS — C641 Malignant neoplasm of right kidney, except renal pelvis: Secondary | ICD-10-CM | POA: Diagnosis not present

## 2016-10-04 DIAGNOSIS — N183 Chronic kidney disease, stage 3 (moderate): Secondary | ICD-10-CM | POA: Diagnosis not present

## 2016-10-04 DIAGNOSIS — D500 Iron deficiency anemia secondary to blood loss (chronic): Secondary | ICD-10-CM | POA: Diagnosis not present

## 2016-10-04 MED ORDER — IOPAMIDOL (ISOVUE-300) INJECTION 61%
75.0000 mL | Freq: Once | INTRAVENOUS | Status: AC | PRN
  Administered 2016-10-04: 50 mL via INTRAVENOUS

## 2016-10-06 ENCOUNTER — Encounter (HOSPITAL_COMMUNITY): Payer: Self-pay

## 2016-10-06 ENCOUNTER — Emergency Department (HOSPITAL_COMMUNITY): Payer: Medicare Other

## 2016-10-06 ENCOUNTER — Inpatient Hospital Stay (HOSPITAL_COMMUNITY)
Admission: EM | Admit: 2016-10-06 | Discharge: 2016-10-09 | DRG: 605 | Disposition: A | Discharge status: Home Health | Payer: Medicare Other | Attending: General Surgery | Admitting: General Surgery

## 2016-10-06 DIAGNOSIS — S098XXA Other specified injuries of head, initial encounter: Secondary | ICD-10-CM | POA: Diagnosis not present

## 2016-10-06 DIAGNOSIS — Z8249 Family history of ischemic heart disease and other diseases of the circulatory system: Secondary | ICD-10-CM | POA: Diagnosis not present

## 2016-10-06 DIAGNOSIS — Z66 Do not resuscitate: Secondary | ICD-10-CM | POA: Diagnosis present

## 2016-10-06 DIAGNOSIS — I129 Hypertensive chronic kidney disease with stage 1 through stage 4 chronic kidney disease, or unspecified chronic kidney disease: Secondary | ICD-10-CM | POA: Diagnosis present

## 2016-10-06 DIAGNOSIS — Z823 Family history of stroke: Secondary | ICD-10-CM

## 2016-10-06 DIAGNOSIS — D62 Acute posthemorrhagic anemia: Secondary | ICD-10-CM | POA: Diagnosis present

## 2016-10-06 DIAGNOSIS — W19XXXA Unspecified fall, initial encounter: Secondary | ICD-10-CM | POA: Diagnosis present

## 2016-10-06 DIAGNOSIS — C649 Malignant neoplasm of unspecified kidney, except renal pelvis: Secondary | ICD-10-CM | POA: Diagnosis present

## 2016-10-06 DIAGNOSIS — Z923 Personal history of irradiation: Secondary | ICD-10-CM

## 2016-10-06 DIAGNOSIS — C641 Malignant neoplasm of right kidney, except renal pelvis: Secondary | ICD-10-CM | POA: Diagnosis present

## 2016-10-06 DIAGNOSIS — W010XXA Fall on same level from slipping, tripping and stumbling without subsequent striking against object, initial encounter: Secondary | ICD-10-CM | POA: Diagnosis not present

## 2016-10-06 DIAGNOSIS — S0101XA Laceration without foreign body of scalp, initial encounter: Secondary | ICD-10-CM | POA: Diagnosis present

## 2016-10-06 DIAGNOSIS — I480 Paroxysmal atrial fibrillation: Secondary | ICD-10-CM | POA: Diagnosis present

## 2016-10-06 DIAGNOSIS — Z86010 Personal history of colonic polyps: Secondary | ICD-10-CM

## 2016-10-06 DIAGNOSIS — S0091XA Abrasion of unspecified part of head, initial encounter: Secondary | ICD-10-CM | POA: Diagnosis not present

## 2016-10-06 DIAGNOSIS — S0990XA Unspecified injury of head, initial encounter: Secondary | ICD-10-CM | POA: Diagnosis not present

## 2016-10-06 DIAGNOSIS — Z803 Family history of malignant neoplasm of breast: Secondary | ICD-10-CM | POA: Diagnosis not present

## 2016-10-06 DIAGNOSIS — Z90710 Acquired absence of both cervix and uterus: Secondary | ICD-10-CM

## 2016-10-06 DIAGNOSIS — I2510 Atherosclerotic heart disease of native coronary artery without angina pectoris: Secondary | ICD-10-CM | POA: Diagnosis not present

## 2016-10-06 DIAGNOSIS — C7931 Secondary malignant neoplasm of brain: Secondary | ICD-10-CM | POA: Diagnosis present

## 2016-10-06 DIAGNOSIS — K5730 Diverticulosis of large intestine without perforation or abscess without bleeding: Secondary | ICD-10-CM | POA: Diagnosis present

## 2016-10-06 DIAGNOSIS — M1990 Unspecified osteoarthritis, unspecified site: Secondary | ICD-10-CM | POA: Diagnosis present

## 2016-10-06 DIAGNOSIS — S0993XA Unspecified injury of face, initial encounter: Secondary | ICD-10-CM | POA: Diagnosis not present

## 2016-10-06 DIAGNOSIS — I252 Old myocardial infarction: Secondary | ICD-10-CM

## 2016-10-06 DIAGNOSIS — Z7901 Long term (current) use of anticoagulants: Secondary | ICD-10-CM

## 2016-10-06 DIAGNOSIS — Z23 Encounter for immunization: Secondary | ICD-10-CM | POA: Diagnosis not present

## 2016-10-06 DIAGNOSIS — N183 Chronic kidney disease, stage 3 unspecified: Secondary | ICD-10-CM | POA: Diagnosis present

## 2016-10-06 DIAGNOSIS — C7800 Secondary malignant neoplasm of unspecified lung: Secondary | ICD-10-CM | POA: Diagnosis not present

## 2016-10-06 DIAGNOSIS — S299XXA Unspecified injury of thorax, initial encounter: Secondary | ICD-10-CM | POA: Diagnosis not present

## 2016-10-06 DIAGNOSIS — M109 Gout, unspecified: Secondary | ICD-10-CM | POA: Diagnosis present

## 2016-10-06 DIAGNOSIS — T1490XA Injury, unspecified, initial encounter: Secondary | ICD-10-CM

## 2016-10-06 DIAGNOSIS — Z950 Presence of cardiac pacemaker: Secondary | ICD-10-CM

## 2016-10-06 DIAGNOSIS — Z800 Family history of malignant neoplasm of digestive organs: Secondary | ICD-10-CM

## 2016-10-06 DIAGNOSIS — Z885 Allergy status to narcotic agent status: Secondary | ICD-10-CM

## 2016-10-06 DIAGNOSIS — C801 Malignant (primary) neoplasm, unspecified: Secondary | ICD-10-CM

## 2016-10-06 DIAGNOSIS — I10 Essential (primary) hypertension: Secondary | ICD-10-CM | POA: Diagnosis present

## 2016-10-06 DIAGNOSIS — E785 Hyperlipidemia, unspecified: Secondary | ICD-10-CM | POA: Diagnosis present

## 2016-10-06 DIAGNOSIS — D500 Iron deficiency anemia secondary to blood loss (chronic): Secondary | ICD-10-CM | POA: Diagnosis not present

## 2016-10-06 DIAGNOSIS — S0181XA Laceration without foreign body of other part of head, initial encounter: Secondary | ICD-10-CM | POA: Diagnosis not present

## 2016-10-06 DIAGNOSIS — Z888 Allergy status to other drugs, medicaments and biological substances status: Secondary | ICD-10-CM

## 2016-10-06 DIAGNOSIS — I4891 Unspecified atrial fibrillation: Secondary | ICD-10-CM | POA: Diagnosis present

## 2016-10-06 LAB — BASIC METABOLIC PANEL
Anion gap: 9 (ref 5–15)
BUN: 23 mg/dL — ABNORMAL HIGH (ref 6–20)
CO2: 22 mmol/L (ref 22–32)
Calcium: 8.7 mg/dL — ABNORMAL LOW (ref 8.9–10.3)
Chloride: 104 mmol/L (ref 101–111)
Creatinine, Ser: 1.35 mg/dL — ABNORMAL HIGH (ref 0.44–1.00)
GFR calc Af Amer: 39 mL/min — ABNORMAL LOW (ref >60)
GFR calc non Af Amer: 34 mL/min — ABNORMAL LOW (ref >60)
Glucose, Bld: 252 mg/dL — ABNORMAL HIGH (ref 65–99)
Potassium: 5.4 mmol/L — ABNORMAL HIGH (ref 3.5–5.1)
Sodium: 135 mmol/L (ref 135–145)

## 2016-10-06 LAB — CBC WITH DIFFERENTIAL/PLATELET
Basophils Absolute: 0 10*3/uL (ref 0.0–0.1)
Basophils Relative: 0 %
Eosinophils Absolute: 0.1 10*3/uL (ref 0.0–0.7)
Eosinophils Relative: 1 %
HCT: 34.1 % — ABNORMAL LOW (ref 36.0–46.0)
Hemoglobin: 10.8 g/dL — ABNORMAL LOW (ref 12.0–15.0)
Lymphocytes Relative: 34 %
Lymphs Abs: 2.6 10*3/uL (ref 0.7–4.0)
MCH: 28.6 pg (ref 26.0–34.0)
MCHC: 31.7 g/dL (ref 30.0–36.0)
MCV: 90.2 fL (ref 78.0–100.0)
Monocytes Absolute: 0.4 10*3/uL (ref 0.1–1.0)
Monocytes Relative: 5 %
Neutro Abs: 4.7 10*3/uL (ref 1.7–7.7)
Neutrophils Relative %: 60 %
Platelets: 169 10*3/uL (ref 150–400)
RBC: 3.78 MIL/uL — ABNORMAL LOW (ref 3.87–5.11)
RDW: 20.7 % — ABNORMAL HIGH (ref 11.5–15.5)
WBC: 7.8 10*3/uL (ref 4.0–10.5)

## 2016-10-06 LAB — CBC
HCT: 30 % — ABNORMAL LOW (ref 36.0–46.0)
Hemoglobin: 9.2 g/dL — ABNORMAL LOW (ref 12.0–15.0)
MCH: 27.8 pg (ref 26.0–34.0)
MCHC: 30.7 g/dL (ref 30.0–36.0)
MCV: 90.6 fL (ref 78.0–100.0)
PLATELETS: 155 10*3/uL (ref 150–400)
RBC: 3.31 MIL/uL — ABNORMAL LOW (ref 3.87–5.11)
RDW: 20.9 % — AB (ref 11.5–15.5)
WBC: 6.8 10*3/uL (ref 4.0–10.5)

## 2016-10-06 LAB — GLUCOSE, CAPILLARY: GLUCOSE-CAPILLARY: 129 mg/dL — AB (ref 65–99)

## 2016-10-06 LAB — ABO/RH: ABO/RH(D): A POS

## 2016-10-06 MED ORDER — ONDANSETRON HCL 4 MG/2ML IJ SOLN
INTRAMUSCULAR | Status: AC
  Filled 2016-10-06: qty 2

## 2016-10-06 MED ORDER — ONDANSETRON HCL 4 MG/2ML IJ SOLN
4.0000 mg | Freq: Once | INTRAMUSCULAR | Status: AC
  Administered 2016-10-06: 4 mg via INTRAVENOUS

## 2016-10-06 MED ORDER — HYDROCODONE-ACETAMINOPHEN 5-325 MG PO TABS: 1.0000 | ORAL_TABLET | ORAL | Status: DC | PRN

## 2016-10-06 MED ORDER — DOCUSATE SODIUM 100 MG PO CAPS
100.0000 mg | ORAL_CAPSULE | Freq: Two times a day (BID) | ORAL | Status: DC
  Administered 2016-10-06 – 2016-10-09 (×6): 100 mg via ORAL
  Filled 2016-10-06 (×6): qty 1

## 2016-10-06 MED ORDER — SODIUM CHLORIDE 0.9 % IV BOLUS (SEPSIS)
1000.0000 mL | Freq: Once | INTRAVENOUS | Status: AC
  Administered 2016-10-06: 1000 mL via INTRAVENOUS

## 2016-10-06 MED ORDER — ISOSORBIDE MONONITRATE ER 30 MG PO TB24: 30.0000 mg | ORAL_TABLET | Freq: Two times a day (BID) | ORAL | Status: DC

## 2016-10-06 MED ORDER — ACETAMINOPHEN 325 MG PO TABS
650.0000 mg | ORAL_TABLET | ORAL | Status: DC | PRN
  Administered 2016-10-06 – 2016-10-08 (×3): 650 mg via ORAL
  Filled 2016-10-06 (×3): qty 2

## 2016-10-06 MED ORDER — TETANUS-DIPHTH-ACELL PERTUSSIS 5-2.5-18.5 LF-MCG/0.5 IM SUSP
INTRAMUSCULAR | Status: AC
  Filled 2016-10-06: qty 0.5

## 2016-10-06 MED ORDER — TETANUS-DIPHTH-ACELL PERTUSSIS 5-2.5-18.5 LF-MCG/0.5 IM SUSP
0.5000 mL | Freq: Once | INTRAMUSCULAR | Status: AC
  Administered 2016-10-06: 0.5 mL via INTRAMUSCULAR

## 2016-10-06 MED ORDER — INSULIN ASPART 100 UNIT/ML ~~LOC~~ SOLN
0.0000 [IU] | Freq: Three times a day (TID) | SUBCUTANEOUS | Status: DC
  Administered 2016-10-07: 3 [IU] via SUBCUTANEOUS
  Administered 2016-10-07: 2 [IU] via SUBCUTANEOUS

## 2016-10-06 MED ORDER — METOPROLOL SUCCINATE ER 25 MG PO TB24: 50.0000 mg | ORAL_TABLET | Freq: Every morning | ORAL | Status: DC

## 2016-10-06 MED ORDER — ONDANSETRON HCL 4 MG/2ML IJ SOLN: 4.0000 mg | Freq: Four times a day (QID) | INTRAMUSCULAR | Status: DC | PRN

## 2016-10-06 MED ORDER — SODIUM CHLORIDE 0.9 % IV SOLN
INTRAVENOUS | Status: DC
  Administered 2016-10-06 – 2016-10-07 (×2): via INTRAVENOUS

## 2016-10-06 MED ORDER — CEFAZOLIN SODIUM-DEXTROSE 2-4 GM/100ML-% IV SOLN
2.0000 g | Freq: Two times a day (BID) | INTRAVENOUS | Status: DC
  Administered 2016-10-06 – 2016-10-07 (×3): 2 g via INTRAVENOUS
  Filled 2016-10-06 (×4): qty 100

## 2016-10-06 MED ORDER — ISOSORBIDE MONONITRATE ER 30 MG PO TB24
30.0000 mg | ORAL_TABLET | Freq: Every day | ORAL | Status: DC
  Administered 2016-10-07 – 2016-10-09 (×3): 30 mg via ORAL
  Filled 2016-10-06 (×3): qty 1

## 2016-10-06 MED ORDER — LIDOCAINE-EPINEPHRINE 1 %-1:100000 IJ SOLN
10.0000 mL | Freq: Once | INTRAMUSCULAR | Status: AC
  Administered 2016-10-06: 10 mL

## 2016-10-06 MED ORDER — NITROGLYCERIN 0.4 MG SL SUBL: 0.4000 mg | SUBLINGUAL_TABLET | SUBLINGUAL | Status: DC | PRN

## 2016-10-06 MED ORDER — ONDANSETRON HCL 4 MG PO TABS: 4.0000 mg | ORAL_TABLET | Freq: Four times a day (QID) | ORAL | Status: DC | PRN

## 2016-10-06 NOTE — Procedures (Signed)
Preop diagnosis: forehead laceration Postoperative diagnosis saa Procedure: closure of 14 cm stellate forehead laceration Surgeon: Dr Serita Grammes Anes: local Comps none dispo to stepdown for monitoring  Indications: This is 28 yof who has forehead laceration that is actively bleeding significantly on eliquis who er asked me to consult for hemorrhage control.    Procedure: I cleansed the area with betadine and anesthetized with 1% lidocaine with epinephrine.  I then closed the entire scalp with running 3-0 and later 2-0 when they could get me that suture locking stitch.  This appeared to stop the hemorrhage. I then cleaned the area and applied surgicel over the top.  she will now undergo ct head.

## 2016-10-06 NOTE — ED Notes (Signed)
Dr. Donne Hazel at bedside suturing pt for head

## 2016-10-06 NOTE — ED Triage Notes (Signed)
To room via EMS.  Pt was at home bending over to put tennis shoes on, fell forward hitting head on shelf of bookcase.  Laceration, avulsed, 4-5" around top of head in hairline.  Bleeding not controlled.  Dr. Wilson Singer at bedside to suture.

## 2016-10-06 NOTE — H&P (Addendum)
Brooke Rios is an 82 y.o. female.   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 She has undergone radiosurgery to the brain.  She did well with this. She has been doing ok until today when she was bent over and tying her shoe. She fell and hit her head on a bookcase and was noted to have significant blood loss. She remembers whole event.  No loc.  She is on eliquis.   Past Medical History:  Diagnosis Date  . Chronic kidney disease 2011   creat 1.5   . Colonic polyp   . Coronary artery disease    Moderate three vessel obstructive coronary disease  . Diverticulosis of colon   . E. coli UTI 02/2013   uniformly sensitive  . Gout   . History of radiation therapy 07/09/2016   SRS treatment to Right frontal lobe brain metastatis  . Hyperlipidemia   . Hypertension   . Metastatic renal cell carcinoma to brain ([place]) 07/09/2016  . Metastatic renal cell carcinoma to lung ([place]) 07/09/2016  . Myocardial infarction 2012  . Osteoarthritis   . Sick sinus syndrome ([facility])    with paroxysmal atrial fibrillation  . Vertigo     Past Surgical History:  Procedure Laterality Date  . ABDOMINAL HYSTERECTOMY    . APPENDECTOMY     done at [facility]  . BREAST BIOPSY Left    x 1  . BREAST LUMPECTOMY Left 2009   Dr Margot Chimes  . CHOLECYSTECTOMY     age 32  . CHOLECYSTECTOMY    . COLONOSCOPY    . OOPHORECTOMY     bilat for fibroids @ age 65, anemia pre op due to dysfunctional menses yo  . PACEMAKER INSERTION  2011  . PAF induced MI, s/p pacer for Brady-tach syndrome  04/2010   Dr.jordan  . TONSILLECTOMY      Family History  Problem Relation Age of Onset  . Hyperlipidemia Father   . Stroke Father     TIA,CVA  . Heart failure Father   . Cancer Mother     ? renal primary  . Breast cancer Sister     11/08  . Breast cancer Maternal Aunt   . Colon cancer Maternal Aunt   . Breast cancer Daughter     lumpectomy 2007;mastectomy 2013  . Heart disease Brother    pacer  . Diabetes Neg Hx    Social History:  reports that she has never smoked. She has never used smokeless tobacco. She reports that she does not drink alcohol or use drugs.  Allergies:  Allergies  Allergen Reactions  . Cefpodoxime Other (See Comments)    Nocturia & disturbed sleep  . Codeine     nausea   Meds reviewed, eliquis  Results for orders placed or performed during the hospital encounter of 10/06/16 (from the past 48 hour(s))  CBC with Differential     Status: Abnormal   Collection Time: 10/06/16  2:05 PM  Result Value Ref Range   WBC 7.8 4.0 - 10.5 K/uL   RBC 3.78 (L) 3.87 - 5.11 MIL/uL   Hemoglobin 10.8 (L) 12.0 - 15.0 g/dL   HCT 34.1 (L) 36.0 - 46.0 %   MCV 90.2 78.0 - 100.0 fL   MCH 28.6 26.0 - 34.0 pg   MCHC 31.7 30.0 - 36.0 g/dL   RDW 20.7 (H) 11.5 - 15.5 %   Platelets 169 150 - 400 K/uL   Neutrophils Relative %  60 %   Neutro Abs 4.7 1.7 - 7.7 K/uL   Lymphocytes Relative 34 %   Lymphs Abs 2.6 0.7 - 4.0 K/uL   Monocytes Relative 5 %   Monocytes Absolute 0.4 0.1 - 1.0 K/uL   Eosinophils Relative 1 %   Eosinophils Absolute 0.1 0.0 - 0.7 K/uL   Basophils Relative 0 %   Basophils Absolute 0.0 0.0 - 0.1 K/uL  Basic metabolic panel     Status: Abnormal   Collection Time: 10/06/16  2:05 PM  Result Value Ref Range   Sodium 135 135 - 145 mmol/L   Potassium 5.4 (H) 3.5 - 5.1 mmol/L    Comment: SPECIMEN HEMOLYZED. HEMOLYSIS MAY AFFECT INTEGRITY OF RESULTS.   Chloride 104 101 - 111 mmol/L   CO2 22 22 - 32 mmol/L   Glucose, Bld 252 (H) 65 - 99 mg/dL   BUN 23 (H) 6 - 20 mg/dL   Creatinine, Ser 1.35 (H) 0.44 - 1.00 mg/dL   Calcium 8.7 (L) 8.9 - 10.3 mg/dL   GFR calc non Af Amer 34 (L) >60 mL/min   GFR calc Af Amer 39 (L) >60 mL/min    Comment: (NOTE) The eGFR has been calculated using the CKD EPI equation. This calculation has not been validated in all clinical situations. eGFR's persistently <60 mL/min signify possible Chronic Kidney Disease.    Anion gap 9  5 - 15   Ct Head Wo Contrast  Result Date: 10/06/2016 CLINICAL DATA:  Head injury. Fall from seated position with scalp laceration. Initial encounter. EXAM: CT HEAD WITHOUT CONTRAST CT MAXILLOFACIAL WITHOUT CONTRAST TECHNIQUE: Multidetector CT imaging of the head and maxillofacial structures were performed using the standard protocol without intravenous contrast. Multiplanar CT image reconstructions of the maxillofacial structures were also generated. COMPARISON:  Two days ago FINDINGS: CT HEAD FINDINGS Brain: No evidence of acute hemorrhage. No posttraumatic brain swelling. There is vasogenic edema in the right frontal lobe surrounding partially calcified metastasis with stable appearance from yesterday. Generalized cerebral volume loss. Vascular: Atherosclerotic calcification.  No hyperdense vessel. Skull: Large right frontal scalp laceration and contusion. No underlying calvarial fracture. Superficial ovoid 4 mm density along the right forehead. Other: Intracranial findings findings discussed in person 10/06/2016 at 3:30 pm to Dr. Rolm Bookbinder. CT MAXILLOFACIAL FINDINGS Osseous: Negative for fracture or mandibular dislocation. TMJ osteoarthritis. Cervical facet arthropathy. Orbits: No evidence of injury. Sinuses: No hemo sinus. Soft tissues: Soft tissue swelling tracks from the right forehead into the right face. IMPRESSION: 1. No evidence of intracranial injury. 2. Stable appearance of right frontal white matter metastasis and vasogenic edema. 3. Right forehead/scalp laceration and hematoma without calvarial fracture. 4 mm foreign body along the superficial right forehead. 4. Negative for facial fracture. Electronically Signed   By: Monte Fantasia M.D.   On: 10/06/2016 15:35   Ct Maxillofacial Wo Contrast  Result Date: 10/06/2016 CLINICAL DATA:  Head injury. Fall from seated position with scalp laceration. Initial encounter. EXAM: CT HEAD WITHOUT CONTRAST CT MAXILLOFACIAL WITHOUT CONTRAST  TECHNIQUE: Multidetector CT imaging of the head and maxillofacial structures were performed using the standard protocol without intravenous contrast. Multiplanar CT image reconstructions of the maxillofacial structures were also generated. COMPARISON:  Two days ago FINDINGS: CT HEAD FINDINGS Brain: No evidence of acute hemorrhage. No posttraumatic brain swelling. There is vasogenic edema in the right frontal lobe surrounding partially calcified metastasis with stable appearance from yesterday. Generalized cerebral volume loss. Vascular: Atherosclerotic calcification.  No hyperdense vessel. Skull: Large right frontal scalp  laceration and contusion. No underlying calvarial fracture. Superficial ovoid 4 mm density along the right forehead. Other: Intracranial findings findings discussed in person 10/06/2016 at 3:30 pm to Dr. Rolm Bookbinder. CT MAXILLOFACIAL FINDINGS Osseous: Negative for fracture or mandibular dislocation. TMJ osteoarthritis. Cervical facet arthropathy. Orbits: No evidence of injury. Sinuses: No hemo sinus. Soft tissues: Soft tissue swelling tracks from the right forehead into the right face. IMPRESSION: 1. No evidence of intracranial injury. 2. Stable appearance of right frontal white matter metastasis and vasogenic edema. 3. Right forehead/scalp laceration and hematoma without calvarial fracture. 4 mm foreign body along the superficial right forehead. 4. Negative for facial fracture. Electronically Signed   By: Monte Fantasia M.D.   On: 10/06/2016 15:35    Review of Systems  Neurological: Positive for headaches.  All other systems reviewed and are negative.  Blood pressure (!) 165/106, pulse 78, temperature 98.1 F (36.7 C), temperature source Oral, resp. rate 16, SpO2 97 %. Physical Exam  Vitals reviewed. Constitutional: She is oriented to person, place, and time. She has a sickly appearance.  HENT:  Head: Head is with laceration.    Large laceration to forehead with multiple  sites of active bleeding, ecchymosis over right forehead and right upper lid  Eyes: EOM are normal. Pupils are equal, round, and reactive to light.  Neck: Neck supple.  Cardiovascular: Normal rate, regular rhythm, normal heart sounds and intact distal pulses.   Respiratory: Effort normal and breath sounds normal.  GI: Soft. There is no tenderness.  Musculoskeletal: She exhibits no tenderness.  Neurological: She is alert and oriented to person, place, and time.  Skin: Skin is warm and dry.     Assessment/Plan Fall on eliquis Stage IV RCC  I sutured laceration in er and appears to have stopped.   Head ct and face negative. Just appears to be laceration.  She is certainly at risk for more blood loss given the eliquis.  Will admit and monitor.  Discussed with patient and her family. [facility] for medical issues I also discussed with patient that if her heart stops she could like to be dnr.  Rolm Bookbinder, MD 10/06/2016, 4:04 PM

## 2016-10-06 NOTE — ED Provider Notes (Signed)
[facility] DEPT Provider Note   CSN: [id number] Arrival date & time: 10/06/16  1338  By signing my name below, I, Reola Mosher, attest that this documentation has been prepared under the direction and in the presence of Virgel Manifold, MD. Electronically Signed: Reola Mosher, ED Scribe. 10/06/16. 2:13 PM.  History   Chief Complaint Chief Complaint  Patient presents with  . Fall  . Head Laceration   The history is provided by the patient and the EMS personnel. No language interpreter was used.    HPI Comments: Brooke Rios is a 82 y.o. female with a PMHx of metastatic clear cell carcinoma of the right kidney and lung with CNS metastasis, who presents to the Emergency Department complaining of several wounds sustained across the frontal superior forehead s/p mechanical fall which occurred just prior to arrival. Bleeding is controlled with constant pressure. Pt reports that she was bending over to tie her shoe when she lost her balance and fell forwards into a cornered, sharp edge of a wooden bookcase that was in-front of her, sustaining her wounds. She denies any LOC with this event. Pt is currently on Eliquis therapy daily. EMS applied an 18g to the right forearm, however, no interventions were given through this access. EMS further reports that her vital signs have remained stable while en route. Per prior chart review, she is currently undergoing PO '400mg'$  Votrient chemotherapy daily for her cancer regimen. She is s/p stereotactic radiosurgery for a solitary brain metastases performed on 09/24/16 (approximately 12 days ago). There are no other symptoms or complaints at this time.    Oncologist: Burney Gauze, MD PCP: Lamar Blinks, MD  Past Medical History:  Diagnosis Date  . Chronic kidney disease 2011   creat 1.5   . Colonic polyp   . Coronary artery disease    Moderate three vessel obstructive coronary disease  . Diverticulosis of colon   . E. coli UTI 02/2013    uniformly sensitive  . Gout   . History of radiation therapy 07/09/2016   SRS treatment to Right frontal lobe brain metastatis  . Hyperlipidemia   . Hypertension   . Metastatic renal cell carcinoma to brain ([place]) 07/09/2016  . Metastatic renal cell carcinoma to lung ([place]) 07/09/2016  . Myocardial infarction 2012  . Osteoarthritis   . Sick sinus syndrome (HCC)    with paroxysmal atrial fibrillation  . Vertigo    Patient Active Problem List   Diagnosis Date Noted  . Metastatic renal cell carcinoma to lung ([place]) 07/09/2016  . Metastatic renal cell carcinoma to brain ([place]) 07/09/2016  . Widespread metastatic malignant neoplastic disease ([place]) 06/06/2016  . Elevated TSH 08/19/2015  . Vertigo 06/21/2014  . Vomiting 06/21/2014  . History of anemia 09/08/2013  . History of gout 05/22/2013  . Pacemaker-Medtronic 09/10/2012  . Unspecified adverse effect of unspecified drug, medicinal and biological substance 06/12/2012  . Hyperglycemia 06/12/2012  . Tachy-brady syndrome ([place]) 02/05/2012  . Atrial fibrillation ([place]) 09/11/2011  . Coronary artery disease   . HIP PAIN 10/17/2010  . DIZZINESS 11/22/2009  . RENAL DISEASE, CHRONIC, MILD 06/08/2009  . BENIGN PAROXYSMAL POSITIONAL VERTIGO 12/30/2008  . BRADYCARDIA, CHRONIC 12/30/2008  . DIVERTICULOSIS, COLON 12/30/2008  . OSTEOARTHRITIS 12/30/2008  . COLONIC POLYPS, HX OF 12/30/2008  . Hyperlipidemia 12/24/2007  . Thrombocytopenia ([place]) 12/24/2007  . Essential hypertension 12/24/2007  . FIBROCYSTIC BREAST DISEASE 12/24/2007   Past Surgical History:  Procedure Laterality Date  . ABDOMINAL HYSTERECTOMY    . APPENDECTOMY  done at [facility]  . BREAST BIOPSY Left    x 1  . BREAST LUMPECTOMY Left 2009   Dr Margot Chimes  . CHOLECYSTECTOMY     age 59  . CHOLECYSTECTOMY    . COLONOSCOPY    . OOPHORECTOMY     bilat for fibroids @ age 24, anemia pre op due to dysfunctional menses yo  . PACEMAKER INSERTION  2011  . PAF induced MI, s/p pacer for  Brady-tach syndrome  04/2010   Dr.jordan  . TONSILLECTOMY     OB History    No data available     Home Medications    Prior to Admission medications   Medication Sig Start Date End Date Taking? Authorizing Provider  acetaminophen (TYLENOL) 500 MG tablet Take 500 mg by mouth every 6 (six) hours as needed.    Historical Provider, MD  apixaban (ELIQUIS) 2.5 MG TABS tablet Take 1 tablet (2.5 mg total) by mouth 2 (two) times daily. 02/17/16   Peter M Martinique, MD  docusate sodium (COLACE) 100 MG capsule Take 100 mg by mouth 2 (two) times daily.    Historical Provider, MD  isosorbide mononitrate (IMDUR) 30 MG 24 hr tablet TAKE 1 TABLET BY MOUTH TWICE A DAY 08/20/16   Peter M Martinique, MD  metoprolol succinate (TOPROL-XL) 50 MG 24 hr tablet Take 1 tablet (50 mg total) by mouth every morning. Take with or immediately following a meal. 07/12/15   Peter M Martinique, MD  nitroGLYCERIN (NITROSTAT) 0.4 MG SL tablet Place 1 tablet (0.4 mg total) under the tongue every 5 (five) minutes as needed for chest pain (MAX 3 TABLETS). 11/08/15   Peter M Martinique, MD  pazopanib (VOTRIENT) 200 MG tablet Take 2 pills a day on an empty stomach 07/24/16   Volanda Napoleon, MD  Polyethylene Glycol 3350 (MIRALAX PO) Take by mouth. Takes every other day    Historical Provider, MD  traMADol (ULTRAM) 50 MG tablet Take 1 tablet (50 mg total) by mouth every 6 (six) hours as needed. 07/16/16   Volanda Napoleon, MD   Family History Family History  Problem Relation Age of Onset  . Hyperlipidemia Father   . Stroke Father     TIA,CVA  . Heart failure Father   . Cancer Mother     ? renal primary  . Breast cancer Sister     11/08  . Breast cancer Maternal Aunt   . Colon cancer Maternal Aunt   . Breast cancer Daughter     lumpectomy 2007;mastectomy 2013  . Heart disease Brother     pacer  . Diabetes Neg Hx    Social History Social History  Substance Use Topics  . Smoking status: Never Smoker  . Smokeless tobacco: Never Used  .  Alcohol use No   Allergies   Cefpodoxime and Codeine  Review of Systems Review of Systems  Skin: Positive for wound.  Allergic/Immunologic: Positive for immunocompromised state.  Neurological: Negative for syncope.  All other systems reviewed and are negative.  Physical Exam Updated Vital Signs BP (!) 165/106   Pulse 78   Temp 98.1 F (36.7 C) (Oral)   Resp 16   SpO2 97%   Physical Exam  Constitutional: She is oriented to person, place, and time. She appears well-developed and well-nourished.  HENT:  Head: Normocephalic.    Right Ear: External ear normal.  Left Ear: External ear normal.  Nose: Nose normal.  Mouth/Throat: Oropharynx is clear and moist.  Large frontal scalp laceration/avulsion  approximately 10cm in total length. Pulsatile bleeding from multiple locations.   Eyes: Conjunctivae are normal. Right eye exhibits no discharge. Left eye exhibits no discharge.  Neck: Normal range of motion.  Cardiovascular: Normal rate, regular rhythm and normal heart sounds.   No murmur heard. Pulmonary/Chest: Effort normal and breath sounds normal. No respiratory distress. She has no wheezes. She has no rales.  Abdominal: Soft. She exhibits no distension. There is no tenderness. There is no rebound and no guarding.  Musculoskeletal: Normal range of motion. She exhibits no edema or tenderness.  Neurological: She is alert and oriented to person, place, and time. She displays normal reflexes. No cranial nerve deficit or sensory deficit. She exhibits normal muscle tone. Coordination normal.  Skin: Skin is warm and dry. No rash noted. No erythema.  Psychiatric: She has a normal mood and affect. Her behavior is normal.  Nursing note and vitals reviewed.  ED Treatments / Results  DIAGNOSTIC STUDIES: Oxygen Saturation is 97% on RA, normal by my interpretation.  COORDINATION OF CARE: 1:51 PM-Discussed next steps with pt. Pt verbalized understanding and is agreeable with the plan.    Labs (all labs ordered are listed, but only abnormal results are displayed) Labs Reviewed  CBC WITH DIFFERENTIAL/PLATELET - Abnormal; Notable for the following:       Result Value   RBC 3.78 (*)    Hemoglobin 10.8 (*)    HCT 34.1 (*)    RDW 20.7 (*)    All other components within normal limits  BASIC METABOLIC PANEL - Abnormal; Notable for the following:    Potassium 5.4 (*)    Glucose, Bld 252 (*)    BUN 23 (*)    Creatinine, Ser 1.35 (*)    Calcium 8.7 (*)    GFR calc non Af Amer 34 (*)    GFR calc Af Amer 39 (*)    All other components within normal limits  CBC - Abnormal; Notable for the following:    RBC 3.31 (*)    Hemoglobin 9.2 (*)    HCT 30.0 (*)    RDW 20.9 (*)    All other components within normal limits  CBC - Abnormal; Notable for the following:    RBC 3.00 (*)    Hemoglobin 8.7 (*)    HCT 27.1 (*)    RDW 20.8 (*)    Platelets 137 (*)    All other components within normal limits  BASIC METABOLIC PANEL - Abnormal; Notable for the following:    CO2 21 (*)    Glucose, Bld 119 (*)    BUN 23 (*)    Creatinine, Ser 1.36 (*)    Calcium 8.3 (*)    GFR calc non Af Amer 34 (*)    GFR calc Af Amer 39 (*)    All other components within normal limits  GLUCOSE, CAPILLARY - Abnormal; Notable for the following:    Glucose-Capillary 129 (*)    All other components within normal limits  GLUCOSE, CAPILLARY - Abnormal; Notable for the following:    Glucose-Capillary 107 (*)    All other components within normal limits  GLUCOSE, CAPILLARY - Abnormal; Notable for the following:    Glucose-Capillary 142 (*)    All other components within normal limits  GLUCOSE, CAPILLARY - Abnormal; Notable for the following:    Glucose-Capillary 156 (*)    All other components within normal limits  CBC - Abnormal; Notable for the following:    RBC 2.54 (*)  Hemoglobin 7.3 (*)    HCT 23.0 (*)    RDW 21.0 (*)    Platelets 124 (*)    All other components within normal limits   BASIC METABOLIC PANEL - Abnormal; Notable for the following:    Chloride 112 (*)    Glucose, Bld 112 (*)    Creatinine, Ser 1.25 (*)    Calcium 8.6 (*)    GFR calc non Af Amer 37 (*)    GFR calc Af Amer 43 (*)    All other components within normal limits  GLUCOSE, CAPILLARY - Abnormal; Notable for the following:    Glucose-Capillary 145 (*)    All other components within normal limits  GLUCOSE, CAPILLARY - Abnormal; Notable for the following:    Glucose-Capillary 105 (*)    All other components within normal limits  FERRITIN - Abnormal; Notable for the following:    Ferritin 370 (*)    All other components within normal limits  GLUCOSE, CAPILLARY - Abnormal; Notable for the following:    Glucose-Capillary 120 (*)    All other components within normal limits  GLUCOSE, CAPILLARY - Abnormal; Notable for the following:    Glucose-Capillary 115 (*)    All other components within normal limits  CBC - Abnormal; Notable for the following:    RBC 3.42 (*)    Hemoglobin 10.1 (*)    HCT 30.9 (*)    RDW 19.6 (*)    Platelets 123 (*)    All other components within normal limits  GLUCOSE, CAPILLARY - Abnormal; Notable for the following:    Glucose-Capillary 112 (*)    All other components within normal limits  GLUCOSE, CAPILLARY - Abnormal; Notable for the following:    Glucose-Capillary 106 (*)    All other components within normal limits  MRSA PCR SCREENING  IRON AND TIBC  TYPE AND SCREEN  ABO/RH  PREPARE RBC (CROSSMATCH)    EKG  EKG Interpretation None      Radiology No results found.  Procedures Procedures   CRITICAL CARE Performed by: Virgel Manifold Total critical care time: 35 minutes Critical care time was exclusive of separately billable procedures and treating other patients.  Critical care was necessary to treat or prevent imminent or life-threatening deterioration.  Arterial  hemorrhage from  large scalp laceration in anticoagulated patient. This  necessitated emergent surgical consultation in the emergency room and admission.  Critical care was time spent personally by me on the following activities: development of treatment plan with patient and/or surrogate as well as nursing, discussions with consultants, evaluation of patient's response to treatment, examination of patient, obtaining history from patient or surrogate, ordering and performing treatments and interventions, ordering and review of laboratory studies, ordering and review of radiographic studies, pulse oximetry and re-evaluation of patient's condition.   Medications Ordered in ED Medications - No data to display  Initial Impression / Assessment and Plan / ED Course  I have reviewed the triage vital signs and the nursing notes.  Pertinent labs & imaging results that were available during my care of the patient were reviewed by me and considered in my medical decision making (see chart for details).  Clinical Course    89yF s/p fall with large scalp laceration/avulsion. Arterial bleeding which I cannot adequately control. Still bleeding through pressure dressing and requiring additional manual pressure. Will consult trauma surgery. Will need scanned but needs adequate hemostasis prior to imaging.   2:16 PM Discussed with DR Donne Hazel. Will move to Trauma C  per request to be assessed there.   Final Clinical Impressions(s) / ED Diagnoses   Final diagnoses:  Trauma  Fall  Clear cell carcinoma (HCC)  Scalp laceration, initial encounter  Acute blood loss anemia   New Prescriptions New Prescriptions   No medications on file   I personally preformed the services scribed in my presence. The recorded information has been reviewed is accurate. Virgel Manifold, MD.      Virgel Manifold, MD 10/15/16 1140

## 2016-10-06 NOTE — ED Notes (Signed)
Pt placed on 4L for sats at 88%. Dr. Donne Hazel paged. Pt denies any pain in chest or difficulty breathing. Does not wear home O2

## 2016-10-06 NOTE — Consult Note (Signed)
[facility] Initial Consultation Note   Patient Name: Brooke Rios    WUJ:[id number] PCP: Lamar Blinks, MD     DOB: 07-06-1925  DOA: 10/06/2016 DOS: the patient was seen and examined on 10/06/2016   Referring physician: Dr Donne Hazel Reason for consult: medical management   HPI: Brooke Rios is a 82 y.o. female with Past medical history of A. fib on Apixaban, CKD, HTN, CAD, RCC with metastasis to brain and lung. Patient is coming from home. The patient was at her baseline during the day. Later in the afternoon she was attempting to wear her shoes and lost her balance and fell on the ground face forward. She denies having any dizziness or lightheadedness prior to the fall. She denies having any fever chills nausea vomiting or diarrhea prior to the fall. No recent change in medications. She denies any recurrent and frequent falls. 3 months ago she has been switched from Pradaxa to Apixaban, her last dose was in the morning of 10/06/2016. She denies any prior bleeding episode. At the time of my evaluation the patient denies having any complaints of dizziness or lightheadedness, no chest pain no shortness of breath no abdominal pain no focal deficit. She has nausea which is getting better with medication.  Review of Systems: as mentioned in the history of present illness.  All other systems reviewed and are negative.  Past Medical History:  Diagnosis Date  . Chronic kidney disease 2011   creat 1.5   . Colonic polyp   . Coronary artery disease    Moderate three vessel obstructive coronary disease  . Diverticulosis of colon   . E. coli UTI 02/2013   uniformly sensitive  . Gout   . History of radiation therapy 07/09/2016   SRS treatment to Right frontal lobe brain metastatis  . Hyperlipidemia   . Hypertension   . Metastatic renal cell carcinoma to brain ([place]) 07/09/2016  . Metastatic renal cell carcinoma to lung ([place]) 07/09/2016  . Myocardial infarction 2012  .  Osteoarthritis   . Sick sinus syndrome (HCC)    with paroxysmal atrial fibrillation  . Vertigo    Past Surgical History:  Procedure Laterality Date  . ABDOMINAL HYSTERECTOMY    . APPENDECTOMY     done at [facility]  . BREAST BIOPSY Left    x 1  . BREAST LUMPECTOMY Left 2009   Dr Margot Chimes  . CHOLECYSTECTOMY     age 48  . CHOLECYSTECTOMY    . COLONOSCOPY    . OOPHORECTOMY     bilat for fibroids @ age 54, anemia pre op due to dysfunctional menses yo  . PACEMAKER INSERTION  2011  . PAF induced MI, s/p pacer for Brady-tach syndrome  04/2010   Dr.jordan  . TONSILLECTOMY     Social History:  reports that she has never smoked. She has never used smokeless tobacco. She reports that she does not drink alcohol or use drugs.  Allergies  Allergen Reactions  . Cefpodoxime Other (See Comments)    Nocturia & disturbed sleep  . Codeine     nausea    Family History  Problem Relation Age of Onset  . Hyperlipidemia Father   . Stroke Father     TIA,CVA  . Heart failure Father   . Cancer Mother     ? renal primary  . Breast cancer Sister     11/08  . Breast cancer Maternal Aunt   . Colon cancer Maternal Aunt   . Breast cancer Daughter  lumpectomy 2007;mastectomy 2013  . Heart disease Brother     pacer  . Diabetes Neg Hx     Prior to Admission medications   Medication Sig Start Date End Date Taking? Authorizing Provider  acetaminophen (TYLENOL) 500 MG tablet Take 500 mg by mouth every 6 (six) hours as needed.   Yes Historical Provider, MD  apixaban (ELIQUIS) 2.5 MG TABS tablet Take 1 tablet (2.5 mg total) by mouth 2 (two) times daily. 02/17/16  Yes Peter M Martinique, MD  Cyanocobalamin (VITAMIN B-12 SL) Place 1 tablet under the tongue daily.   Yes Historical Provider, MD  docusate sodium (COLACE) 100 MG capsule Take 100 mg by mouth 2 (two) times daily.   Yes Historical Provider, MD  isosorbide mononitrate (IMDUR) 30 MG 24 hr tablet TAKE 1 TABLET BY MOUTH TWICE A DAY 08/20/16  Yes Peter M  Martinique, MD  metoprolol succinate (TOPROL-XL) 50 MG 24 hr tablet Take 1 tablet (50 mg total) by mouth every morning. Take with or immediately following a meal. Patient taking differently: Take 50 mg by mouth daily. Take with or immediately following a meal. 07/12/15  Yes Peter M Martinique, MD  nitroGLYCERIN (NITROSTAT) 0.4 MG SL tablet Place 1 tablet (0.4 mg total) under the tongue every 5 (five) minutes as needed for chest pain (MAX 3 TABLETS). 11/08/15  Yes Peter M Martinique, MD  pazopanib (VOTRIENT) 200 MG tablet Take 2 pills a day on an empty stomach Patient taking differently: Take 400 mg by mouth daily before breakfast. Take on an empty stomach - one hour before eating 07/24/16  Yes Volanda Napoleon, MD  polyethylene glycol (MIRALAX / GLYCOLAX) packet Take 17 g by mouth daily as needed (constipation). Mix in 8 oz liquid and drink    Yes Historical Provider, MD  traMADol (ULTRAM) 50 MG tablet Take 1 tablet (50 mg total) by mouth every 6 (six) hours as needed. Patient not taking: Reported on 10/06/2016 07/16/16   Volanda Napoleon, MD    Physical Exam: Vitals:   10/06/16 1700 10/06/16 1715 10/06/16 1721 10/06/16 1730  BP: 105/90 120/75  130/78  Pulse: 69 67  67  Resp: '24 23  20  '$ Temp:   98.5 F (36.9 C)   TempSrc:   Oral   SpO2: 95% 96%  95%    General: Alert, Awake and Oriented to Time, Place and Person. Appear in moderate distress, affect appropriate Eyes: Significant swelling and ecchymosis of the right eyelid, unable to open. PRRL on the left, Conjunctiva normal on the left.  Patient denies any pain on extraocular movement bilaterally. ENT: Oral Mucosa clear moist. Neck: difficult to assess JVD, no Abnormal Mass Or lumps Cardiovascular: S1 and S2 Present, no Murmur, Peripheral Pulses Present Respiratory: Bilateral Air entry equal and Decreased, no use of accessory muscle, Clear to Auscultation, no Crackles, no wheezes Abdomen: Bowel Sound present, Soft and no tenderness Skin: Ecchymosis of  the right side of the face and scalp, no Rash, no induration Extremities: no Pedal edema, no calf tenderness Neurologic: Grossly no focal neuro deficit. Bilaterally Equal motor strength  Labs:  CBC:  Recent Labs Lab 10/06/16 1405  WBC 7.8  NEUTROABS 4.7  HGB 10.8*  HCT 34.1*  MCV 90.2  PLT 458   Basic Metabolic Panel:  Recent Labs Lab 10/06/16 1405  NA 135  K 5.4*  CL 104  CO2 22  GLUCOSE 252*  BUN 23*  CREATININE 1.35*  CALCIUM 8.7*   Liver Function Tests: No results  for input(s): AST, ALT, ALKPHOS, BILITOT, PROT, ALBUMIN in the last 168 hours. No results for input(s): LIPASE, AMYLASE in the last 168 hours. No results for input(s): AMMONIA in the last 168 hours.  Cardiac Enzymes: No results for input(s): CKTOTAL, CKMB, CKMBINDEX, TROPONINI in the last 168 hours. No results for input(s): PROBNP in the last 8760 hours.  CBG: No results for input(s): GLUCAP in the last 168 hours.  Radiological Exams: Dg Chest 2 View  Result Date: 10/06/2016 CLINICAL DATA:  Decreased pulse ox. Lost balance, forward fall, hit head. EXAM: CHEST  2 VIEW COMPARISON:  CT chest and chest radiograph 06/04/2016. FINDINGS: Trachea is midline. Heart size stable. Pacemaker lead tips are in the right atrium and right ventricle. Lungs are clear. No pleural fluid. No pneumothorax. Osseous structures are grossly intact. IMPRESSION: No acute findings.  Of and along the Electronically Signed   By: Lorin Picket M.D.   On: 10/06/2016 17:07   Ct Head Wo Contrast  Result Date: 10/06/2016 CLINICAL DATA:  Head injury. Fall from seated position with scalp laceration. Initial encounter. EXAM: CT HEAD WITHOUT CONTRAST CT MAXILLOFACIAL WITHOUT CONTRAST TECHNIQUE: Multidetector CT imaging of the head and maxillofacial structures were performed using the standard protocol without intravenous contrast. Multiplanar CT image reconstructions of the maxillofacial structures were also generated. COMPARISON:  Two  days ago FINDINGS: CT HEAD FINDINGS Brain: No evidence of acute hemorrhage. No posttraumatic brain swelling. There is vasogenic edema in the right frontal lobe surrounding partially calcified metastasis with stable appearance from yesterday. Generalized cerebral volume loss. Vascular: Atherosclerotic calcification.  No hyperdense vessel. Skull: Large right frontal scalp laceration and contusion. No underlying calvarial fracture. Superficial ovoid 4 mm density along the right forehead. Other: Intracranial findings findings discussed in person 10/06/2016 at 3:30 pm to Dr. Rolm Bookbinder. CT MAXILLOFACIAL FINDINGS Osseous: Negative for fracture or mandibular dislocation. TMJ osteoarthritis. Cervical facet arthropathy. Orbits: No evidence of injury. Sinuses: No hemo sinus. Soft tissues: Soft tissue swelling tracks from the right forehead into the right face. IMPRESSION: 1. No evidence of intracranial injury. 2. Stable appearance of right frontal white matter metastasis and vasogenic edema. 3. Right forehead/scalp laceration and hematoma without calvarial fracture. 4 mm foreign body along the superficial right forehead. 4. Negative for facial fracture. Electronically Signed   By: Monte Fantasia M.D.   On: 10/06/2016 15:35   Ct Maxillofacial Wo Contrast  Result Date: 10/06/2016 CLINICAL DATA:  Head injury. Fall from seated position with scalp laceration. Initial encounter. EXAM: CT HEAD WITHOUT CONTRAST CT MAXILLOFACIAL WITHOUT CONTRAST TECHNIQUE: Multidetector CT imaging of the head and maxillofacial structures were performed using the standard protocol without intravenous contrast. Multiplanar CT image reconstructions of the maxillofacial structures were also generated. COMPARISON:  Two days ago FINDINGS: CT HEAD FINDINGS Brain: No evidence of acute hemorrhage. No posttraumatic brain swelling. There is vasogenic edema in the right frontal lobe surrounding partially calcified metastasis with stable appearance  from yesterday. Generalized cerebral volume loss. Vascular: Atherosclerotic calcification.  No hyperdense vessel. Skull: Large right frontal scalp laceration and contusion. No underlying calvarial fracture. Superficial ovoid 4 mm density along the right forehead. Other: Intracranial findings findings discussed in person 10/06/2016 at 3:30 pm to Dr. Rolm Bookbinder. CT MAXILLOFACIAL FINDINGS Osseous: Negative for fracture or mandibular dislocation. TMJ osteoarthritis. Cervical facet arthropathy. Orbits: No evidence of injury. Sinuses: No hemo sinus. Soft tissues: Soft tissue swelling tracks from the right forehead into the right face. IMPRESSION: 1. No evidence of intracranial injury. 2. Stable appearance  of right frontal white matter metastasis and vasogenic edema. 3. Right forehead/scalp laceration and hematoma without calvarial fracture. 4 mm foreign body along the superficial right forehead. 4. Negative for facial fracture. Electronically Signed   By: Monte Fantasia M.D.   On: 10/06/2016 15:35    EKG: Pending  Assessment/Plan 1. Principal Problem:   Scalp laceration, initial encounter S/P laceration repair by trauma surgery. At present the bleeding appears to be controlled. Patient will be admitted in the step down unit. Recommend to continue to monitor H&H every 6 hours. Transfuse for hemoglobin less than 7. Currently holding off on reversing Apixaban.  Active Problems: 2. Essential hypertension Blood pressure currently well controlled. I would hold off on all her antihypertensive medication overnight. Resume tomorrow based on the control.  3.  Atrial fibrillation (HCC) Chronic anti-coagulation with Apixaban. Currently rate controlled. Would hold off Apixaban at present. Monitor H&H and telemetry  4.Metastatic renal cell carcinoma to lung [facility])   Metastatic renal cell carcinoma to brain [facility])  Currently holding patient's VOTRIENT, as it is associated with hemorrhage in 5% patient  taking it. I have taken liberty to add Dr. Marin Olp to the patient's care team.  5.  Mechanical fall. Physical therapy consult.  6. CKD (chronic kidney disease) stage 3, GFR 30-59 ml/min Renal function stable. Avoid hypotension. Avoid nephrotoxic medication. Agree with IV hydration.  Family Communication: family was present at bedside, at the time of interview. The pt provided permission to discuss medical plan with the family. Opportunity was given to ask question and all questions were answered satisfactorily.   Primary team communication: Discussed with Dr. Donne Hazel.  Thank you very much for involving [place] in care of your patient.  We will continue to follow the patient.   Author: Berle Mull, MD [facility] Pager: [phone] 10/06/2016 5:44 PM    If 7PM-7AM, please contact night-coverage [email or web address] Password [email or web address]

## 2016-10-06 NOTE — ED Notes (Signed)
Head wrapped in [place], Estill [place] EMT holding pressure to head awaiting Trauma.

## 2016-10-07 DIAGNOSIS — I480 Paroxysmal atrial fibrillation: Secondary | ICD-10-CM

## 2016-10-07 DIAGNOSIS — I10 Essential (primary) hypertension: Secondary | ICD-10-CM

## 2016-10-07 DIAGNOSIS — N183 Chronic kidney disease, stage 3 (moderate): Secondary | ICD-10-CM

## 2016-10-07 DIAGNOSIS — I2510 Atherosclerotic heart disease of native coronary artery without angina pectoris: Secondary | ICD-10-CM

## 2016-10-07 DIAGNOSIS — W19XXXA Unspecified fall, initial encounter: Secondary | ICD-10-CM

## 2016-10-07 DIAGNOSIS — C7801 Secondary malignant neoplasm of right lung: Secondary | ICD-10-CM

## 2016-10-07 DIAGNOSIS — C649 Malignant neoplasm of unspecified kidney, except renal pelvis: Secondary | ICD-10-CM

## 2016-10-07 LAB — GLUCOSE, CAPILLARY
GLUCOSE-CAPILLARY: 145 mg/dL — AB (ref 65–99)
Glucose-Capillary: 107 mg/dL — ABNORMAL HIGH (ref 65–99)
Glucose-Capillary: 142 mg/dL — ABNORMAL HIGH (ref 65–99)
Glucose-Capillary: 156 mg/dL — ABNORMAL HIGH (ref 65–99)

## 2016-10-07 LAB — BASIC METABOLIC PANEL
ANION GAP: 9 (ref 5–15)
BUN: 23 mg/dL — ABNORMAL HIGH (ref 6–20)
CALCIUM: 8.3 mg/dL — AB (ref 8.9–10.3)
CHLORIDE: 106 mmol/L (ref 101–111)
CO2: 21 mmol/L — ABNORMAL LOW (ref 22–32)
CREATININE: 1.36 mg/dL — AB (ref 0.44–1.00)
GFR calc non Af Amer: 34 mL/min — ABNORMAL LOW (ref >60)
GFR, EST AFRICAN AMERICAN: 39 mL/min — AB (ref >60)
Glucose, Bld: 119 mg/dL — ABNORMAL HIGH (ref 65–99)
Potassium: 4.7 mmol/L (ref 3.5–5.1)
SODIUM: 136 mmol/L (ref 135–145)

## 2016-10-07 LAB — CBC
HEMATOCRIT: 27.1 % — AB (ref 36.0–46.0)
HEMOGLOBIN: 8.7 g/dL — AB (ref 12.0–15.0)
MCH: 29 pg (ref 26.0–34.0)
MCHC: 32.1 g/dL (ref 30.0–36.0)
MCV: 90.3 fL (ref 78.0–100.0)
Platelets: 137 10*3/uL — ABNORMAL LOW (ref 150–400)
RBC: 3 MIL/uL — ABNORMAL LOW (ref 3.87–5.11)
RDW: 20.8 % — AB (ref 11.5–15.5)
WBC: 6.6 10*3/uL (ref 4.0–10.5)

## 2016-10-07 LAB — MRSA PCR SCREENING: MRSA BY PCR: NEGATIVE

## 2016-10-07 MED ORDER — SENNA 8.6 MG PO TABS
1.0000 | ORAL_TABLET | Freq: Two times a day (BID) | ORAL | Status: DC
  Administered 2016-10-07 – 2016-10-08 (×3): 8.6 mg via ORAL
  Filled 2016-10-07 (×5): qty 1

## 2016-10-07 NOTE — Progress Notes (Addendum)
Subjective: Stable.  Alert.  Oriented.  Mental status normal. No active bleeding Has ambulated BP 147/67.  Heart rate 61, intermittently paced.  Afebrile.  SPO2 96% Hemoglobin 10.8 at 2 PM yesterday.  8.7 now.  Creatinine 1.36, stable  Has pacemaker. Holding eliquis for now Objective: Vital signs in last 24 hours: Temp:  [97.2 F (36.2 C)-98.5 F (36.9 C)] 97.2 F (36.2 C) (11/19 0800) Pulse Rate:  [61-78] 61 (11/19 0800) Resp:  [13-27] 17 (11/19 0800) BP: (85-165)/(46-106) 147/67 (11/19 0800) SpO2:  [90 %-100 %] 96 % (11/19 0800) Last BM Date: 10/06/16  Intake/Output from previous day: 11/18 0701 - 11/19 0700 In: 633.3 [I.V.:633.3] Out: 200 [Urine:200] Intake/Output this shift: No intake/output data recorded.  General appearance: Very alert.  Very oriented.  Minimal distress.  Sitting in chair Head:Transverse laceration frontal scalp below hairline intact.  No significant hematoma but lots of ecchymoses extending down to and around the right periorbital area. Eyes, Significant right periorbital edema and ecchymoses.  Globe is visualized with lots of conjunctival hemorrhage and edema but iris appears intact.  Good vision counts fingers. Neck: no adenopathy, no carotid bruit, no JVD, supple, symmetrical, trachea midline, thyroid not enlarged, symmetric, no tenderness/mass/nodules and 1.  Nontender to palpate. no pain with range of motion.   Resp: clear to auscultation bilaterally GI: soft, non-tender; bowel sounds normal; no masses,  no organomegaly  Lab Results:   Recent Labs  10/06/16 1932 10/07/16 0208  WBC 6.8 6.6  HGB 9.2* 8.7*  HCT 30.0* 27.1*  PLT 155 137*   BMET  Recent Labs  10/06/16 1405 10/07/16 0208  NA 135 136  K 5.4* 4.7  CL 104 106  CO2 22 21*  GLUCOSE 252* 119*  BUN 23* 23*  CREATININE 1.35* 1.36*  CALCIUM 8.7* 8.3*   PT/INR No results for input(s): LABPROT, INR in the last 72 hours. ABG No results for input(s): PHART, HCO3 in the  last 72 hours.  Invalid input(s): PCO2, PO2  Studies/Results: Dg Chest 2 View  Result Date: 10/06/2016 CLINICAL DATA:  Decreased pulse ox. Lost balance, forward fall, hit head. EXAM: CHEST  2 VIEW COMPARISON:  CT chest and chest radiograph 06/04/2016. FINDINGS: Trachea is midline. Heart size stable. Pacemaker lead tips are in the right atrium and right ventricle. Lungs are clear. No pleural fluid. No pneumothorax. Osseous structures are grossly intact. IMPRESSION: No acute findings.  Of and along the Electronically Signed   By: Lorin Picket M.D.   On: 10/06/2016 17:07   Ct Head Wo Contrast  Result Date: 10/06/2016 CLINICAL DATA:  Head injury. Fall from seated position with scalp laceration. Initial encounter. EXAM: CT HEAD WITHOUT CONTRAST CT MAXILLOFACIAL WITHOUT CONTRAST TECHNIQUE: Multidetector CT imaging of the head and maxillofacial structures were performed using the standard protocol without intravenous contrast. Multiplanar CT image reconstructions of the maxillofacial structures were also generated. COMPARISON:  Two days ago FINDINGS: CT HEAD FINDINGS Brain: No evidence of acute hemorrhage. No posttraumatic brain swelling. There is vasogenic edema in the right frontal lobe surrounding partially calcified metastasis with stable appearance from yesterday. Generalized cerebral volume loss. Vascular: Atherosclerotic calcification.  No hyperdense vessel. Skull: Large right frontal scalp laceration and contusion. No underlying calvarial fracture. Superficial ovoid 4 mm density along the right forehead. Other: Intracranial findings findings discussed in person 10/06/2016 at 3:30 pm to Dr. Rolm Bookbinder. CT MAXILLOFACIAL FINDINGS Osseous: Negative for fracture or mandibular dislocation. TMJ osteoarthritis. Cervical facet arthropathy. Orbits: No evidence of injury. Sinuses: No hemo  sinus. Soft tissues: Soft tissue swelling tracks from the right forehead into the right face. IMPRESSION: 1. No  evidence of intracranial injury. 2. Stable appearance of right frontal white matter metastasis and vasogenic edema. 3. Right forehead/scalp laceration and hematoma without calvarial fracture. 4 mm foreign body along the superficial right forehead. 4. Negative for facial fracture. Electronically Signed   By: Monte Fantasia M.D.   On: 10/06/2016 15:35   Ct Maxillofacial Wo Contrast  Result Date: 10/06/2016 CLINICAL DATA:  Head injury. Fall from seated position with scalp laceration. Initial encounter. EXAM: CT HEAD WITHOUT CONTRAST CT MAXILLOFACIAL WITHOUT CONTRAST TECHNIQUE: Multidetector CT imaging of the head and maxillofacial structures were performed using the standard protocol without intravenous contrast. Multiplanar CT image reconstructions of the maxillofacial structures were also generated. COMPARISON:  Two days ago FINDINGS: CT HEAD FINDINGS Brain: No evidence of acute hemorrhage. No posttraumatic brain swelling. There is vasogenic edema in the right frontal lobe surrounding partially calcified metastasis with stable appearance from yesterday. Generalized cerebral volume loss. Vascular: Atherosclerotic calcification.  No hyperdense vessel. Skull: Large right frontal scalp laceration and contusion. No underlying calvarial fracture. Superficial ovoid 4 mm density along the right forehead. Other: Intracranial findings findings discussed in person 10/06/2016 at 3:30 pm to Dr. Rolm Bookbinder. CT MAXILLOFACIAL FINDINGS Osseous: Negative for fracture or mandibular dislocation. TMJ osteoarthritis. Cervical facet arthropathy. Orbits: No evidence of injury. Sinuses: No hemo sinus. Soft tissues: Soft tissue swelling tracks from the right forehead into the right face. IMPRESSION: 1. No evidence of intracranial injury. 2. Stable appearance of right frontal white matter metastasis and vasogenic edema. 3. Right forehead/scalp laceration and hematoma without calvarial fracture. 4 mm foreign body along the  superficial right forehead. 4. Negative for facial fracture. Electronically Signed   By: Monte Fantasia M.D.   On: 10/06/2016 15:35    Anti-infectives: Anti-infectives    Start     Dose/Rate Route Frequency Ordered Stop   10/06/16 2200  ceFAZolin (ANCEF) IVPB 2g/100 mL premix     2 g 200 mL/hr over 30 Minutes Intravenous Every 12 hours 10/06/16 2050        Assessment/Plan:  Fall Frontal scalp laceration with significant blood loss, no further hemorrhage Pacemaker on eliquis - holding eliquis Stage IV renal cell carcinoma followed by Dr. Marin Olp Transfer telemetry PT consult Labs in am   LOS: 1 day    Raisa Ditto M 10/07/2016
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
## 2016-10-07 NOTE — Progress Notes (Signed)
PT Cancellation Note  Patient Details Name: Brooke Rios MRN: [id number] DOB: 07-18-1925   Cancelled Treatment:    Reason Eval/Treat Not Completed: Patient not medically ready Pt on bedrest. Will follow up when activity orders are increased.    Marguarite Arbour A Isha Seefeld 10/07/2016, 7:07 AM  Wray Kearns, PT, DPT [phone]

## 2016-10-07 NOTE — Progress Notes (Signed)
PROGRESS NOTE    Brooke Rios  MHD:[id number] DOB: 1925-07-09 DOA: 10/06/2016 PCP: Lamar Blinks, MD   Brief Narrative:  Brooke Rios is a 82 y.o. female with Past medical history of A. fib on Apixaban, CKD, HTN, CAD, RCC with metastasis to brain and lung. Patient is coming from home. The patient was at her baseline during the day yesterday. Later in the afternoon she was attempting to wear her shoes and lost her balance and fell on the ground face forward. She denies having any dizziness or lightheadedness prior to the fall. She denies having any fever chills nausea vomiting or diarrhea prior to the fall. No recent change in medications. She denies any recurrent and frequent falls. 3 months ago she has been switched from Pradaxa to Apixaban, her last dose was in the morning of 10/06/2016.She denies any prior bleeding episode. She was worked up and admitted for a 14 cm stellate forehead laceration that was sutured by Dr. Donne Hazel for hemorrhage control. [facility] has been consulted for medical management of her other comorobidities.   Assessment & Plan:   Principal Problem:   Scalp laceration, initial encounter Active Problems:   Essential hypertension   Coronary artery disease   Atrial fibrillation (HCC)   Pacemaker-[employer]   Metastatic renal cell carcinoma to lung (HCC)   Metastatic renal cell carcinoma to brain (HCC)   Fall   CKD (chronic kidney disease) stage 3, GFR 30-59 ml/min  1. Scalp Laceration S/P laceration repair by trauma surgery. -At present the bleeding appears to be controlled. -Patient to be transferred from SDU to Telemetry -Recommend to continue to monitor H&H every 6 hours. -Transfuse for hemoglobin less than 7. -Currently holding off on reversing Apixaban. -IV Cefazolin 2 grams q12 hours per Trauma Surgery  2. Essential hypertension -Blood pressure currently running between 138/70 - 149/59 -Resume Metoprolol 50 mg tomorrow based on the control  tomorrow  3.  Atrial fibrillation (HCC) Chronic anti-coagulation with Apixaban. -Currently rate controlled. -Continue to Hold off Apixaban at present. -Monitor H&H and telemetry  4.Metastatic renal cell carcinoma to lung [facility]   Metastatic renal cell carcinoma to brain [facility]) -Currently holding patient's VOTRIENT, as it is associated with hemorrhage in 5% patient taking it. -Recent Stereotatic Radiosurgery -Follow up with Dr. Marin Olp as an out patient.  5.  Mechanical fall. Physical Therapy Recommends Home Health PT  6. CKD (chronic kidney disease) stage 3, GFR 30-59 ml/min -Renal function stable and BUN/Cr went from 23/1.35 -> 23/1.36 -Avoid hypotension. -Avoid nephrotoxic medications. -Agree with IV hydration.  DVT prophylaxis: SCDs Code Status: DNR Family Communication:  Discussed with family at bedsdie. Disposition Plan: Per Primary Team  Procedures: S/p Closure of 14 cm stellate forehead laceration  Antimicrobials: Cefazolin per primary  Subjective: Seen and examined at bedside and doing better. Had significant facial bruising. States she fell into her TV Stand. No N/V. No Headaches or Lightheadedness or dizziness. No other concerns or complaints.   Objective: Vitals:   10/06/16 1921 10/06/16 2356 10/07/16 0354 10/07/16 0800  BP: 135/67 127/66 124/60 (!) 147/67  Pulse: 68 61 61 61  Resp: 14 (!) 26 (!) 24 17  Temp: 98.3 F (36.8 C) 98.1 F (36.7 C) 98.1 F (36.7 C) 97.2 F (36.2 C)  TempSrc: Oral Oral Oral Oral  SpO2: 100% 98% 100% 96%    Intake/Output Summary (Last 24 hours) at 10/07/16 1137 Last data filed at 10/07/16 0700  Gross per 24 hour  Intake  633.33 ml  Output              200 ml  Net           433.33 ml   There were no vitals filed for this visit.  Examination: Physical Exam:  Constitutional: WN/WD, NAD. Large Frontal scalp laceration with dried blood.  Eyes: Significant ecchymoses and brusing around Right Eye and Face. Some  bruising on left under eye.  ENMT: External Ears, Nose appear normal. Grossly normal hearing.  Neck: Appears normal, supple, no cervical masses, normal ROM, no appreciable thyromegaly Respiratory: Clear to auscultation bilaterally, no wheezing, rales, rhonchi or crackles. Normal respiratory effort and patient is not tachypenic. No accessory muscle use.  Cardiovascular: Irregularly Irregular, no murmurs / rubs / gallops. S1 and S2 auscultated. Abdomen: Soft, non-tender, non-distended. No masses palpated. No appreciable hepatosplenomegaly. Bowel sounds positive.  GU: Deferred. Musculoskeletal: No clubbing / cyanosis of digits/nails. No joint deformity upper and lower extremities. Skin: Multiple bruising sites noted on face and arms. Neurologic: CN 2-12 grossly intact with no focal deficits. Sensation intact in all 4 Extremities. Romberg sign cerebellar reflexes not assessed.  Psychiatric: Normal judgment and insight. Alert and oriented x 3. Normal mood and appropriate affect.   Data Reviewed: I have personally reviewed following labs and imaging studies  CBC:  Recent Labs Lab 10/06/16 1405 10/06/16 1932 10/07/16 0208  WBC 7.8 6.8 6.6  NEUTROABS 4.7  --   --   HGB 10.8* 9.2* 8.7*  HCT 34.1* 30.0* 27.1*  MCV 90.2 90.6 90.3  PLT 169 155 956*   Basic Metabolic Panel:  Recent Labs Lab 10/06/16 1405 10/07/16 0208  NA 135 136  K 5.4* 4.7  CL 104 106  CO2 22 21*  GLUCOSE 252* 119*  BUN 23* 23*  CREATININE 1.35* 1.36*  CALCIUM 8.7* 8.3*   GFR: Estimated Creatinine Clearance: 30.8 mL/min (by C-G formula based on SCr of 1.36 mg/dL (H)). Liver Function Tests: No results for input(s): AST, ALT, ALKPHOS, BILITOT, PROT, ALBUMIN in the last 168 hours. No results for input(s): LIPASE, AMYLASE in the last 168 hours. No results for input(s): AMMONIA in the last 168 hours. Coagulation Profile: No results for input(s): INR, PROTIME in the last 168 hours. Cardiac Enzymes: No results for  input(s): CKTOTAL, CKMB, CKMBINDEX, TROPONINI in the last 168 hours. BNP (last 3 results) No results for input(s): PROBNP in the last 8760 hours. HbA1C: No results for input(s): HGBA1C in the last 72 hours. CBG:  Recent Labs Lab 10/06/16 2138 10/07/16 0755  GLUCAP 129* 107*   Lipid Profile: No results for input(s): CHOL, HDL, LDLCALC, TRIG, CHOLHDL, LDLDIRECT in the last 72 hours. Thyroid Function Tests: No results for input(s): TSH, T4TOTAL, FREET4, T3FREE, THYROIDAB in the last 72 hours. Anemia Panel: No results for input(s): VITAMINB12, FOLATE, FERRITIN, TIBC, IRON, RETICCTPCT in the last 72 hours. Sepsis Labs: No results for input(s): PROCALCITON, LATICACIDVEN in the last 168 hours.  Recent Results (from the past 240 hour(s))  MRSA PCR Screening     Status: None   Collection Time: 10/06/16  6:44 PM  Result Value Ref Range Status   MRSA by PCR NEGATIVE NEGATIVE Final    Comment:        The GeneXpert MRSA Assay (FDA approved for NASAL specimens only), is one component of a comprehensive MRSA colonization surveillance program. It is not intended to diagnose MRSA infection nor to guide or monitor treatment for MRSA infections.     Radiology Studies:  Dg Chest 2 View  Result Date: 10/06/2016 CLINICAL DATA:  Decreased pulse ox. Lost balance, forward fall, hit head. EXAM: CHEST  2 VIEW COMPARISON:  CT chest and chest radiograph 06/04/2016. FINDINGS: Trachea is midline. Heart size stable. Pacemaker lead tips are in the right atrium and right ventricle. Lungs are clear. No pleural fluid. No pneumothorax. Osseous structures are grossly intact. IMPRESSION: No acute findings.  Of and along the Electronically Signed   By: Lorin Picket M.D.   On: 10/06/2016 17:07   Ct Head Wo Contrast  Result Date: 10/06/2016 CLINICAL DATA:  Head injury. Fall from seated position with scalp laceration. Initial encounter. EXAM: CT HEAD WITHOUT CONTRAST CT MAXILLOFACIAL WITHOUT CONTRAST  TECHNIQUE: Multidetector CT imaging of the head and maxillofacial structures were performed using the standard protocol without intravenous contrast. Multiplanar CT image reconstructions of the maxillofacial structures were also generated. COMPARISON:  Two days ago FINDINGS: CT HEAD FINDINGS Brain: No evidence of acute hemorrhage. No posttraumatic brain swelling. There is vasogenic edema in the right frontal lobe surrounding partially calcified metastasis with stable appearance from yesterday. Generalized cerebral volume loss. Vascular: Atherosclerotic calcification.  No hyperdense vessel. Skull: Large right frontal scalp laceration and contusion. No underlying calvarial fracture. Superficial ovoid 4 mm density along the right forehead. Other: Intracranial findings findings discussed in person 10/06/2016 at 3:30 pm to Dr. Rolm Bookbinder. CT MAXILLOFACIAL FINDINGS Osseous: Negative for fracture or mandibular dislocation. TMJ osteoarthritis. Cervical facet arthropathy. Orbits: No evidence of injury. Sinuses: No hemo sinus. Soft tissues: Soft tissue swelling tracks from the right forehead into the right face. IMPRESSION: 1. No evidence of intracranial injury. 2. Stable appearance of right frontal white matter metastasis and vasogenic edema. 3. Right forehead/scalp laceration and hematoma without calvarial fracture. 4 mm foreign body along the superficial right forehead. 4. Negative for facial fracture. Electronically Signed   By: Monte Fantasia M.D.   On: 10/06/2016 15:35   Ct Maxillofacial Wo Contrast  Result Date: 10/06/2016 CLINICAL DATA:  Head injury. Fall from seated position with scalp laceration. Initial encounter. EXAM: CT HEAD WITHOUT CONTRAST CT MAXILLOFACIAL WITHOUT CONTRAST TECHNIQUE: Multidetector CT imaging of the head and maxillofacial structures were performed using the standard protocol without intravenous contrast. Multiplanar CT image reconstructions of the maxillofacial structures were also  generated. COMPARISON:  Two days ago FINDINGS: CT HEAD FINDINGS Brain: No evidence of acute hemorrhage. No posttraumatic brain swelling. There is vasogenic edema in the right frontal lobe surrounding partially calcified metastasis with stable appearance from yesterday. Generalized cerebral volume loss. Vascular: Atherosclerotic calcification.  No hyperdense vessel. Skull: Large right frontal scalp laceration and contusion. No underlying calvarial fracture. Superficial ovoid 4 mm density along the right forehead. Other: Intracranial findings findings discussed in person 10/06/2016 at 3:30 pm to Dr. Rolm Bookbinder. CT MAXILLOFACIAL FINDINGS Osseous: Negative for fracture or mandibular dislocation. TMJ osteoarthritis. Cervical facet arthropathy. Orbits: No evidence of injury. Sinuses: No hemo sinus. Soft tissues: Soft tissue swelling tracks from the right forehead into the right face. IMPRESSION: 1. No evidence of intracranial injury. 2. Stable appearance of right frontal white matter metastasis and vasogenic edema. 3. Right forehead/scalp laceration and hematoma without calvarial fracture. 4 mm foreign body along the superficial right forehead. 4. Negative for facial fracture. Electronically Signed   By: Monte Fantasia M.D.   On: 10/06/2016 15:35   Scheduled Meds: .  ceFAZolin (ANCEF) IV  2 g Intravenous Q12H  . docusate sodium  100 mg Oral BID  . insulin aspart  0-15  Units Subcutaneous TID WC  . isosorbide mononitrate  30 mg Oral Daily   Continuous Infusions: . sodium chloride 50 mL/hr at 10/06/16 1720    LOS: 1 day   Kerney Elbe, DO [facility] Pager [phone]  If 7PM-7AM, please contact night-coverage [email or web address] Password [email or web address] 10/07/2016, 11:37 AM

## 2016-10-08 ENCOUNTER — Inpatient Hospital Stay (HOSPITAL_COMMUNITY): Payer: Medicare Other

## 2016-10-08 ENCOUNTER — Encounter (HOSPITAL_COMMUNITY): Payer: Self-pay | Admitting: Radiology

## 2016-10-08 DIAGNOSIS — I4891 Unspecified atrial fibrillation: Secondary | ICD-10-CM

## 2016-10-08 DIAGNOSIS — W1830XA Fall on same level, unspecified, initial encounter: Secondary | ICD-10-CM

## 2016-10-08 DIAGNOSIS — D62 Acute posthemorrhagic anemia: Secondary | ICD-10-CM | POA: Diagnosis present

## 2016-10-08 DIAGNOSIS — S0101XA Laceration without foreign body of scalp, initial encounter: Secondary | ICD-10-CM

## 2016-10-08 DIAGNOSIS — C7931 Secondary malignant neoplasm of brain: Secondary | ICD-10-CM

## 2016-10-08 DIAGNOSIS — C7800 Secondary malignant neoplasm of unspecified lung: Secondary | ICD-10-CM

## 2016-10-08 DIAGNOSIS — C641 Malignant neoplasm of right kidney, except renal pelvis: Secondary | ICD-10-CM

## 2016-10-08 DIAGNOSIS — Z7901 Long term (current) use of anticoagulants: Secondary | ICD-10-CM

## 2016-10-08 LAB — BASIC METABOLIC PANEL
ANION GAP: 5 (ref 5–15)
BUN: 16 mg/dL (ref 6–20)
CALCIUM: 8.6 mg/dL — AB (ref 8.9–10.3)
CO2: 24 mmol/L (ref 22–32)
Chloride: 112 mmol/L — ABNORMAL HIGH (ref 101–111)
Creatinine, Ser: 1.25 mg/dL — ABNORMAL HIGH (ref 0.44–1.00)
GFR calc non Af Amer: 37 mL/min — ABNORMAL LOW (ref >60)
GFR, EST AFRICAN AMERICAN: 43 mL/min — AB (ref >60)
Glucose, Bld: 112 mg/dL — ABNORMAL HIGH (ref 65–99)
POTASSIUM: 4.6 mmol/L (ref 3.5–5.1)
Sodium: 141 mmol/L (ref 135–145)

## 2016-10-08 LAB — CBC
HEMATOCRIT: 23 % — AB (ref 36.0–46.0)
HEMOGLOBIN: 7.3 g/dL — AB (ref 12.0–15.0)
MCH: 28.7 pg (ref 26.0–34.0)
MCHC: 31.7 g/dL (ref 30.0–36.0)
MCV: 90.6 fL (ref 78.0–100.0)
Platelets: 124 10*3/uL — ABNORMAL LOW (ref 150–400)
RBC: 2.54 MIL/uL — AB (ref 3.87–5.11)
RDW: 21 % — AB (ref 11.5–15.5)
WBC: 4.9 10*3/uL (ref 4.0–10.5)

## 2016-10-08 LAB — GLUCOSE, CAPILLARY
GLUCOSE-CAPILLARY: 120 mg/dL — AB (ref 65–99)
GLUCOSE-CAPILLARY: 112 mg/dL — AB (ref 65–99)
Glucose-Capillary: 105 mg/dL — ABNORMAL HIGH (ref 65–99)
Glucose-Capillary: 115 mg/dL — ABNORMAL HIGH (ref 65–99)

## 2016-10-08 LAB — IRON AND TIBC
Iron: 42 ug/dL (ref 28–170)
Saturation Ratios: 16 % (ref 10.4–31.8)
TIBC: 259 ug/dL (ref 250–450)
UIBC: 217 ug/dL

## 2016-10-08 LAB — FERRITIN: Ferritin: 370 ng/mL — ABNORMAL HIGH (ref 11–307)

## 2016-10-08 LAB — PREPARE RBC (CROSSMATCH)

## 2016-10-08 MED ORDER — POLYETHYLENE GLYCOL 3350 17 G PO PACK: 17.0000 g | PACK | Freq: Every day | ORAL | Status: DC | PRN

## 2016-10-08 MED ORDER — APIXABAN 2.5 MG PO TABS
2.5000 mg | ORAL_TABLET | Freq: Two times a day (BID) | ORAL | Status: DC
  Administered 2016-10-08 – 2016-10-09 (×3): 2.5 mg via ORAL
  Filled 2016-10-08 (×3): qty 1

## 2016-10-08 MED ORDER — DIPHENHYDRAMINE HCL 12.5 MG/5ML PO ELIX
12.5000 mg | ORAL_SOLUTION | Freq: Once | ORAL | Status: AC
  Administered 2016-10-08: 12.5 mg via ORAL
  Filled 2016-10-08: qty 10

## 2016-10-08 MED ORDER — SODIUM CHLORIDE 0.9 % IV SOLN
Freq: Once | INTRAVENOUS | Status: AC
  Administered 2016-10-08: 08:00:00 via INTRAVENOUS

## 2016-10-08 MED ORDER — FUROSEMIDE 10 MG/ML IJ SOLN
20.0000 mg | Freq: Once | INTRAMUSCULAR | Status: AC
  Administered 2016-10-08: 20 mg via INTRAVENOUS
  Filled 2016-10-08: qty 2

## 2016-10-08 MED ORDER — BACITRACIN ZINC 500 UNIT/GM EX OINT
TOPICAL_OINTMENT | Freq: Two times a day (BID) | CUTANEOUS | Status: DC
  Filled 2016-10-08: qty 28.35

## 2016-10-08 MED ORDER — IOPAMIDOL (ISOVUE-300) INJECTION 61%
INTRAVENOUS | Status: AC
  Administered 2016-10-08: 50 mL
  Filled 2016-10-08: qty 50

## 2016-10-08 MED ORDER — POLYVINYL ALCOHOL 1.4 % OP SOLN
1.0000 [drp] | OPHTHALMIC | Status: DC | PRN
  Filled 2016-10-08: qty 15

## 2016-10-08 MED ORDER — HYPROMELLOSE (GONIOSCOPIC) 2.5 % OP SOLN
1.0000 [drp] | OPHTHALMIC | Status: DC | PRN
  Filled 2016-10-08: qty 15

## 2016-10-08 MED ORDER — ACETAMINOPHEN 325 MG PO TABS
650.0000 mg | ORAL_TABLET | Freq: Once | ORAL | Status: AC
  Administered 2016-10-08: 650 mg via ORAL
  Filled 2016-10-08: qty 2

## 2016-10-08 NOTE — Care Management Note (Addendum)
Case Management Note  Patient Details  Name: Brooke Rios MRN: [id number] Date of Birth: Jul 05, 1925  Subjective/Objective:   Pt admitted on 10/06/16 s/p fall with Rt forehead/scalp laceration and hematoma.  PTA, pt independent, lives alone.                   Action/Plan: Met with pt and daughter, [name], at bedside.  PT recommending [place] follow up at discharge.  Pt states her other daughter, [name] lives 3 miles from her, and is a Marine scientist.  Daughter plans to stay with her at dc for several days.  Pt/daughter agreeable to [facility] follow up at dc, and would like to use [facility] for [facility] services.  Will obtain orders for HHPT and make referral to [facility].    Expected Discharge Date:    10/09/16              Expected Discharge Plan:  [place]  In-House Referral:     Discharge planning Services  CM Consult  Post Acute Care Choice:  Home Health Choice offered to:  Patient, Adult Children  DME Arranged:    DME Agency:     HH Arranged:  PT [place]:  [place]  Status of Service:  In process, will continue to follow  If discussed at Long Length of Stay Meetings, dates discussed:    Additional Comments: Referral to [facility] for [facility] follow up; start of care 24-48h post discharge date.  No DME needs identified.    Reinaldo Raddle, RN, BSN  Trauma/Neuro ICU Case Manager [phone]

## 2016-10-08 NOTE — Progress Notes (Signed)
Patient arrived to 3E20 from 3S. Admit with fall complications. A&O x4. No complaints of pain. Sutures to forehead. Multiple bruises to bilateral arms. SR on telemetry. Patient has a demand pacer. VSS.  High fall risk. Patient oriented to room and call system. Bed alarm set.
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
## 2016-10-08 NOTE — Progress Notes (Signed)
Patient ID: Brooke Rios, female   DOB: 04-30-1925, 82 y.o.   MRN: [id number]   LOS: 2 days   Subjective: Doing well, sore but manageable.   Objective: Vital signs in last 24 hours: Temp:  [97.2 F (36.2 C)-98.5 F (36.9 C)] 98.5 F (36.9 C) (11/20 0609) Pulse Rate:  [61-80] 63 (11/20 0609) Resp:  [17-29] 20 (11/20 0609) BP: (132-154)/(52-70) 154/65 (11/20 0609) SpO2:  [94 %-98 %] 98 % (11/20 0609) Weight:  [80.6 kg (177 lb 11.2 oz)-81.2 kg (179 lb)] 80.6 kg (177 lb 11.2 oz) (11/20 0609) Last BM Date: 10/07/16   Laboratory  CBC  Recent Labs  10/07/16 0208 10/08/16 0504  WBC 6.6 4.9  HGB 8.7* 7.3*  HCT 27.1* 23.0*  PLT 137* 124*   BMET  Recent Labs  10/07/16 0208 10/08/16 0504  NA 136 141  K 4.7 4.6  CL 106 112*  CO2 21* 24  GLUCOSE 119* 112*  BUN 23* 16  CREATININE 1.36* 1.25*  CALCIUM 8.3* 8.6*   CBG (last 3)   Recent Labs  10/07/16 1657 10/07/16 2113 10/08/16 0606  GLUCAP 156* 145* 105*    Physical Exam General appearance: alert and no distress Resp: clear to auscultation bilaterally Cardio: regular rate and rhythm GI: normal findings: bowel sounds normal and soft, non-tender Pulses: 2+ and symmetric Incision/Wound:Laceration C/D/I   Assessment/Plan: Fall Scalp lac -- Local care ABL anemia -- Transfuse Multiple medical problems -- Appreciate oncology f/u. Will order planned CT chest as requested by Dr. Marin Olp. Home meds. FEN -- SL IV VTE -- SCD's, restart Elequis Dispo -- Likely home tomorrow    Lisette Abu, PA-C Pager: [phone] General Trauma PA Pager: [phone]  10/08/2016

## 2016-10-08 NOTE — Progress Notes (Signed)
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 no LOC. Head CT-Right forehead/scalp laceration and hematoma.    PT Comments    Patient seen for mobility progression. Tolerated ambulation well Min guard to min assist, and was able to perform aspects of self care and functional tasks with min guard/close supervision for safety. Current POC remains appropriate.  Follow Up Recommendations  Home health PT;Supervision for mobility/OOB     Equipment Recommendations  Other (comment) (TBA)    Recommendations for Other Services OT consult     Precautions / Restrictions Precautions Precautions: Fall Precaution Comments: Right eye swollen shut Restrictions Weight Bearing Restrictions: No    Mobility  Bed Mobility Overal bed mobility: Modified Independent             General bed mobility comments: no physical assist required, increased time to EOB  Transfers Overall transfer level: Needs assistance Equipment used: None Transfers: Sit to/from Stand Sit to Stand: Min guard         General transfer comment: Min guard for safety  Ambulation/Gait Ambulation/Gait assistance: Min assist Ambulation Distance (Feet): 140 Feet Assistive device: 1 person hand held assist Gait Pattern/deviations: Step-through pattern;Decreased stride length;Drifts right/left Gait velocity: decreased   General Gait Details: VCs for increased cadence and relaxation of UEs. Min assist via UE support for stability and comfort at this time   Financial trader Rankin (Stroke Patients Only)       Balance   Sitting-balance support: Feet supported Sitting  balance-Leahy Scale: Good     Standing balance support: During functional activity Standing balance-Leahy Scale: Fair Standing balance comment: able to stand and perform hygiene and pericare with min guard for safety, No overt LOB noted                    Cognition Arousal/Alertness: Awake/alert Behavior During Therapy: WFL for tasks assessed/performed Overall Cognitive Status: Within Functional Limits for tasks assessed                      Exercises      General Comments General comments (skin integrity, edema, etc.): grandaughter present during session      Pertinent Vitals/Pain Pain Assessment: Faces Faces Pain Scale: Hurts little more Pain Location: face, forehead Pain Descriptors / Indicators: Sore    Home Living                      Prior Function            PT Goals (current goals can now be found in the care plan section) Acute Rehab PT Goals Patient Stated Goal: to go home PT Goal Formulation: With patient Time For Goal Achievement: 10/21/16 Potential to Achieve Goals: Good Progress towards PT goals: Progressing toward goals    Frequency    Min 3X/week      PT Plan Current plan remains appropriate    Co-evaluation             End of Session Equipment Utilized During Treatment: Gait belt Activity Tolerance: Patient limited by pain Patient left: in  chair;with call bell/phone within reach;with family/visitor present     Time: 1153-1211 PT Time Calculation (min) (ACUTE ONLY): 18 min  Charges:  $Gait Training: 8-22 mins                    G Codes:      Duncan Dull 2016/10/22, 4:59 PM Alben Deeds, [place] DPT  [phone]

## 2016-10-08 NOTE — Progress Notes (Signed)
PROGRESS NOTE    Brooke Rios  NFA:[id number] DOB: May 15, 1925 DOA: 10/06/2016 PCP: Lamar Blinks, MD   Brief Narrative:  Brooke Rios is a 82 y.o. female with Past medical history of A. fib on Apixaban, CKD, HTN, CAD, RCC with metastasis to brain and lung. Patient is coming from home. The patient was at her baseline during the day yesterday. Later in the afternoon she was attempting to wear her shoes and lost her balance and fell on the ground face forward. She denies having any dizziness or lightheadedness prior to the fall. She denies having any fever chills nausea vomiting or diarrhea prior to the fall. No recent change in medications. She denies any recurrent and frequent falls. 3 months ago she has been switched from Pradaxa to Apixaban, her last dose was in the morning of 10/06/2016.She denies any prior bleeding episode. She was worked up and admitted for a 14 cm stellate forehead laceration that was sutured by Dr. Donne Hazel for hemorrhage control. TRH has been consulted for medical management of her other comorobidities.   Assessment & Plan:   Principal Problem:   Scalp laceration, initial encounter Active Problems:   Essential hypertension   Coronary artery disease   Atrial fibrillation (HCC)   Pacemaker-Medtronic   Metastatic renal cell carcinoma to lung (HCC)   Metastatic renal cell carcinoma to brain (HCC)   Fall   CKD (chronic kidney disease) stage 3, GFR 30-59 ml/min   Acute blood loss anemia  1. Scalp Laceration S/P laceration repair by trauma surgery. -At present the bleeding appears to be controlled. -Patient to be transfused 2 units of pRBC; Given Lasix inbetween units -Eliquis restarted by Trauma Surgery -D/C'd IV Cefazolin 2 grams q12 hours per Trauma Surgery  2. Essential hypertension -Blood pressure currently running between 132/52 - 164/63 -Resume Metoprolol 50 mg   3.  Atrial fibrillation (HCC) Chronic anti-coagulation with Apixaban. -Currently  rate controlled. -Apixaban started back by Surgery -Monitor H&H and telemetry  4.Metastatic renal cell carcinoma to lung [facility])   Metastatic renal cell carcinoma to brain [facility]) -Currently holding patient's VOTRIENT, as it is associated with hemorrhage in 5% patient taking it. -Recent Stereotatic Radiosurgery -Dr. Marin Olp saw patient - Ordered CT Scans of Chest -Follow up with Dr. Marin Olp as an out patient.  5.  Mechanical fall. Physical Therapy Recommends Home Health PT  6. CKD (chronic kidney disease) stage 3, GFR 30-59 ml/min -Renal function stable and BUN/Cr went from 23/1.35 -> 23/1.36 -> 16/1.25 -Avoid hypotension. -Avoid nephrotoxic medications. -Agree with Blood Transfusions  7. Acute Blood Loss Anemia -Patient's Hb/Hct trending down and went from 8.7/27.1 -> 7.3/23.0 -Agree with Transfusions per Dr. Marin Olp and Surgery -Repeat CBC in AM  DVT prophylaxis: SCDs, Apixaban Code Status: DNR Family Communication:  Discussed with family at bedsdie. Disposition Plan: Per Primary Team  Procedures: S/p Closure of 14 cm stellate forehead laceration  Antimicrobials: Cefazolin per primary now D/C'd  Subjective: Seen and examined at bedside and doing better and was able to open Right eye now. No N/V and receiving blood. No other complaints or concerns.   Objective: Vitals:   10/08/16 1147 10/08/16 1419 10/08/16 1659 10/08/16 1944  BP: (!) 146/58 (!) 147/57 140/61 (!) 150/63  Pulse: 72 80 72 80  Resp:  '18 18 18  '$ Temp: 98 F (36.7 C) 98 F (36.7 C) 97.9 F (36.6 C) 98.2 F (36.8 C)  TempSrc: Oral Oral Oral Oral  SpO2: 100% 95% 99% 98%  Weight:  Height:        Intake/Output Summary (Last 24 hours) at 10/08/16 2041 Last data filed at 10/08/16 1922  Gross per 24 hour  Intake           1532.5 ml  Output             2525 ml  Net           -992.5 ml   Filed Weights   10/07/16 0800 10/07/16 2031 10/08/16 0609  Weight: 81 kg (178 lb 9.2 oz) 81.2 kg (179 lb) 80.6  kg (177 lb 11.2 oz)    Examination: Physical Exam:  Constitutional: WN/WD, NAD. Large Frontal scalp laceration with dried blood.  Eyes: Significant ecchymoses and brusing around Right Eye and Face. Some bruising on left under eye.  ENMT: External Ears, Nose appear normal. Grossly normal hearing.  Neck: Appears normal, supple, no cervical masses, normal ROM, no appreciable thyromegaly Respiratory: Clear to auscultation bilaterally, no wheezing, rales, rhonchi or crackles. Normal respiratory effort and patient is not tachypenic. No accessory muscle use.  Cardiovascular: Irregularly Irregular, no murmurs / rubs / gallops. S1 and S2 auscultated. Abdomen: Soft, non-tender, non-distended. No masses palpated. No appreciable hepatosplenomegaly. Bowel sounds positive.  GU: Deferred. Musculoskeletal: No clubbing / cyanosis of digits/nails. No joint deformity upper and lower extremities. Skin: Multiple bruising sites noted on face and arms. Neurologic: CN 2-12 grossly intact with no focal deficits. Sensation intact in all 4 Extremities. Romberg sign cerebellar reflexes not assessed.  Psychiatric: Normal judgment and insight. Alert and oriented x 3. Normal mood and appropriate affect.   Data Reviewed: I have personally reviewed following labs and imaging studies  CBC:  Recent Labs Lab 10/06/16 1405 10/06/16 1932 10/07/16 0208 10/08/16 0504  WBC 7.8 6.8 6.6 4.9  NEUTROABS 4.7  --   --   --   HGB 10.8* 9.2* 8.7* 7.3*  HCT 34.1* 30.0* 27.1* 23.0*  MCV 90.2 90.6 90.3 90.6  PLT 169 155 137* 361*   Basic Metabolic Panel:  Recent Labs Lab 10/06/16 1405 10/07/16 0208 10/08/16 0504  NA 135 136 141  K 5.4* 4.7 4.6  CL 104 106 112*  CO2 22 21* 24  GLUCOSE 252* 119* 112*  BUN 23* 23* 16  CREATININE 1.35* 1.36* 1.25*  CALCIUM 8.7* 8.3* 8.6*   GFR: Estimated Creatinine Clearance: 33.3 mL/min (by C-G formula based on SCr of 1.25 mg/dL (H)). Liver Function Tests: No results for input(s):  AST, ALT, ALKPHOS, BILITOT, PROT, ALBUMIN in the last 168 hours. No results for input(s): LIPASE, AMYLASE in the last 168 hours. No results for input(s): AMMONIA in the last 168 hours. Coagulation Profile: No results for input(s): INR, PROTIME in the last 168 hours. Cardiac Enzymes: No results for input(s): CKTOTAL, CKMB, CKMBINDEX, TROPONINI in the last 168 hours. BNP (last 3 results) No results for input(s): PROBNP in the last 8760 hours. HbA1C: No results for input(s): HGBA1C in the last 72 hours. CBG:  Recent Labs Lab 10/07/16 1657 10/07/16 2113 10/08/16 0606 10/08/16 1140 10/08/16 1709  GLUCAP 156* 145* 105* 120* 115*   Lipid Profile: No results for input(s): CHOL, HDL, LDLCALC, TRIG, CHOLHDL, LDLDIRECT in the last 72 hours. Thyroid Function Tests: No results for input(s): TSH, T4TOTAL, FREET4, T3FREE, THYROIDAB in the last 72 hours. Anemia Panel:  Recent Labs  10/08/16 1010  FERRITIN 370*  TIBC 259  IRON 42   Sepsis Labs: No results for input(s): PROCALCITON, LATICACIDVEN in the last 168 hours.  Recent Results (  from the past 240 hour(s))  MRSA PCR Screening     Status: None   Collection Time: 10/06/16  6:44 PM  Result Value Ref Range Status   MRSA by PCR NEGATIVE NEGATIVE Final    Comment:        The GeneXpert MRSA Assay (FDA approved for NASAL specimens only), is one component of a comprehensive MRSA colonization surveillance program. It is not intended to diagnose MRSA infection nor to guide or monitor treatment for MRSA infections.     Radiology Studies: Ct Chest W Contrast  Result Date: 10/08/2016 CLINICAL DATA:  Kidney cancer. EXAM: CT CHEST WITH CONTRAST TECHNIQUE: Multidetector CT imaging of the chest was performed during intravenous contrast administration. CONTRAST:  63m ISOVUE-300 IOPAMIDOL (ISOVUE-300) INJECTION 61% COMPARISON:  06/04/2016 CT chest abdomen pelvis. FINDINGS: Cardiovascular: Atherosclerotic calcification of the arterial  vasculature, including three-vessel involvement of the coronary arteries. Heart size within normal limits. No pericardial effusion. Mediastinum/Nodes: No pathologically enlarged mediastinal, hilar or axillary lymph nodes. Esophagus is grossly unremarkable. Lungs/Pleura: There are new and enlarging hematogenously distributed pulmonary nodules. 5 mm nodule in the lateral segment right middle lobe (series 205, image 64) previously measured 2 mm. 5 mm right lower lobe nodule (image 83) is new. No pleural fluid. Airway is unremarkable. Upper Abdomen: There may be a few scattered sub cm low-attenuation lesions in the liver, too small to characterize. Cholecystectomy. Right adrenal gland is unremarkable. 3.1 cm left adrenal mass is stable in size and measures 48 Hounsfield units. Heterogeneous right renal mass measures 6.0 x 7.3 cm, similar but incompletely imaged. A lymph node along the medial margin of the left adrenal gland measures 9 mm is new. Spleen is unremarkable. Low-attenuation lesion in the uncinate process of the pancreas measures 1.4 cm, similar. Stomach and visualized portions of the bowel are grossly unremarkable. Musculoskeletal: No worrisome lytic or sclerotic lesions. Healing or healed sternal fracture. IMPRESSION: 1. Right renal mass, consistent with the given history of renal cell carcinoma, incompletely imaged. New/enlarging pulmonary metastases. 2. Left adrenal mass, stable, worrisome for metastatic disease. 3. New lymph node adjacent to the left adrenal gland. 4. Aortic atherosclerosis (ICD10-170.0). Three-vessel coronary artery calcification. 5. Low-attenuation lesion in the uncinate process of the pancreas, likely a pseudocyst if there is a history of pancreatitis. Given patient's age, follow-up as clinically indicated. Electronically Signed   By: MLorin PicketM.D.   On: 10/08/2016 09:22   Scheduled Meds: . apixaban  2.5 mg Oral BID  . bacitracin   Topical BID  . docusate sodium  100 mg  Oral BID  . insulin aspart  0-15 Units Subcutaneous TID WC  . isosorbide mononitrate  30 mg Oral Daily  . senna  1 tablet Oral BID   Continuous Infusions:   LOS: 2 days   OKerney Elbe DO [facility] Pager [phone] If 7PM-7AM, please contact night-coverage [email or web address] Password [facility]11/20/2017, 8:41 PM

## 2016-10-08 NOTE — Progress Notes (Signed)
Per Dr. Hulen Skains, do not put bacitracin on SurgiCel on head wound until it falls off on its own.

## 2016-10-09 ENCOUNTER — Encounter: Payer: Medicare Other | Admitting: Internal Medicine

## 2016-10-09 LAB — TYPE AND SCREEN
ABO/RH(D): A POS
Antibody Screen: NEGATIVE
Unit division: 0
Unit division: 0

## 2016-10-09 LAB — GLUCOSE, CAPILLARY: Glucose-Capillary: 106 mg/dL — ABNORMAL HIGH (ref 65–99)

## 2016-10-09 LAB — CBC
HCT: 30.9 % — ABNORMAL LOW (ref 36.0–46.0)
HEMOGLOBIN: 10.1 g/dL — AB (ref 12.0–15.0)
MCH: 29.5 pg (ref 26.0–34.0)
MCHC: 32.7 g/dL (ref 30.0–36.0)
MCV: 90.4 fL (ref 78.0–100.0)
PLATELETS: 123 10*3/uL — AB (ref 150–400)
RBC: 3.42 MIL/uL — AB (ref 3.87–5.11)
RDW: 19.6 % — ABNORMAL HIGH (ref 11.5–15.5)
WBC: 5 10*3/uL (ref 4.0–10.5)

## 2016-10-09 MED ORDER — SODIUM CHLORIDE 0.9 % IV SOLN
510.0000 mg | Freq: Once | INTRAVENOUS | Status: AC
  Administered 2016-10-09: 510 mg via INTRAVENOUS
  Filled 2016-10-09: qty 17

## 2016-10-09 MED ORDER — POLYVINYL ALCOHOL 1.4 % OP SOLN: 1.0000 [drp] | OPHTHALMIC | 0 refills | Status: AC | PRN

## 2016-10-09 NOTE — Discharge Summary (Signed)
Physician Discharge Summary  Patient ID: Brooke Rios MRN: [id number] DOB/AGE: 12/19/1924 82 y.o.  Admit date: 10/06/2016 Discharge date: 10/09/2016  Discharge Diagnoses Patient Active Problem List   Diagnosis Date Noted  . Acute blood loss anemia 10/08/2016  . Fall 10/06/2016  . Scalp laceration, initial encounter 10/06/2016  . CKD (chronic kidney disease) stage 3, GFR 30-59 ml/min 10/06/2016  . Metastatic renal cell carcinoma to lung ([place]) 07/09/2016  . Metastatic renal cell carcinoma to brain ([place]) 07/09/2016  . Widespread metastatic malignant neoplastic disease ([place]) 06/06/2016  . Elevated TSH 08/19/2015  . Vertigo 06/21/2014  . Vomiting 06/21/2014  . History of anemia 09/08/2013  . History of gout 05/22/2013  . Pacemaker-Medtronic 09/10/2012  . Unspecified adverse effect of unspecified drug, medicinal and biological substance 06/12/2012  . Hyperglycemia 06/12/2012  . Tachy-brady syndrome ([place]) 02/05/2012  . Atrial fibrillation ([place]) 09/11/2011  . Coronary artery disease   . HIP PAIN 10/17/2010  . DIZZINESS 11/22/2009  . RENAL DISEASE, CHRONIC, MILD 06/08/2009  . BENIGN PAROXYSMAL POSITIONAL VERTIGO 12/30/2008  . BRADYCARDIA, CHRONIC 12/30/2008  . DIVERTICULOSIS, COLON 12/30/2008  . OSTEOARTHRITIS 12/30/2008  . COLONIC POLYPS, HX OF 12/30/2008  . Hyperlipidemia 12/24/2007  . Thrombocytopenia ([place]) 12/24/2007  . Essential hypertension 12/24/2007  . FIBROCYSTIC BREAST DISEASE 12/24/2007    Consultants Dr. Burney Gauze for oncology   Procedures 11/18 -- Repair of frontal laceration by Dr. Rolm Bookbinder   HPI: Brooke Rios is a woman who had stage IV renal cell cancer metastatic to lung and brain. She had undergone radiosurgery to the brain. She did well with this. She had been doing ok when she was bent over and tying her shoe. She lost her balance and fell, hitting her head on a bookcase, and was noted to have significant blood loss. She remembered the whole  event and did not have a loss of consciousness. She was on eliquis. Her workup included CT scans of the head and face that were negative. Her laceration was closed in the ED and she was admitted to the trauma service. Oncology was consulted as well.   Hospital Course: The patient developed an acute blood loss anemia and received two units of packed red blood cells. Oncology completed some of her cancer surveillance while she was here and also recommended she receive some iron. She was mobilized with physical therapy who recommended home health for continued improvement. Her Elequis was held initially but restarted during her stay with no resumption of bleeding. She was discharged home in good condition.     Medication List    TAKE these medications   acetaminophen 500 MG tablet Commonly known as:  TYLENOL Take 500 mg by mouth every 6 (six) hours as needed.   apixaban 2.5 MG Tabs tablet Commonly known as:  ELIQUIS Take 1 tablet (2.5 mg total) by mouth 2 (two) times daily.   docusate sodium 100 MG capsule Commonly known as:  COLACE Take 100 mg by mouth 2 (two) times daily.   isosorbide mononitrate 30 MG 24 hr tablet Commonly known as:  IMDUR TAKE 1 TABLET BY MOUTH TWICE A DAY   metoprolol succinate 50 MG 24 hr tablet Commonly known as:  TOPROL-XL Take 1 tablet (50 mg total) by mouth every morning. Take with or immediately following a meal. What changed:  when to take this  additional instructions   nitroGLYCERIN 0.4 MG SL tablet Commonly known as:  NITROSTAT Place 1 tablet (0.4 mg total) under the tongue every 5 (five) minutes  as needed for chest pain (MAX 3 TABLETS).   pazopanib 200 MG tablet Commonly known as:  VOTRIENT Take 2 pills a day on an empty stomach What changed:  how much to take  how to take this  when to take this  additional instructions   polyethylene glycol packet Commonly known as:  MIRALAX / GLYCOLAX Take 17 g by mouth daily as needed  (constipation). Mix in 8 oz liquid and drink   polyvinyl alcohol 1.4 % ophthalmic solution Commonly known as:  LIQUIFILM TEARS Place 1 drop into both eyes every hour as needed for dry eyes.   traMADol 50 MG tablet Commonly known as:  ULTRAM Take 1 tablet (50 mg total) by mouth every 6 (six) hours as needed.   VITAMIN B-12 SL Place 1 tablet under the tongue daily.       Follow-up Information    [facility] Follow up on 10/17/2016.   Why:  2:00PM Contact information: [place] [id number] [phone]       Volanda Napoleon, MD. Schedule an appointment as soon as possible for a visit.   Specialty:  Oncology Contact information: [address] [place], [address] [place] [address] [phone]            Signed: Lisette Abu, PA-C Pager: [phone] General Trauma PA Pager: [phone] 10/09/2016, 7:49 AM

## 2016-10-09 NOTE — Progress Notes (Signed)
Brooke Rios is about the same. She still has a large amount of ecchymoses on her face from her fall.  She did continue some blood yesterday. This has made her feel a little bit better.  Her iron studies do show that her iron is on the borderline of being low. As such, I think that a dose of IV iron would be beneficial while she is in the hospital.  Her CT of the chest, unfortunately, did show that there was some progression of her disease. It does not look like she has a lot of progression. She does have a right renal mass measuring 6 x 7.3 cm. the radiologist does not comment as to whether or not this is larger.  She did walk a little but yesterday.  Her labs not yet back from today.  It sounds like she might go home today.  On her physical exam, her blood pressure is 140/78. She is a febrile. Her pulse is 76. Her head and exam shows large amount of ecchymoses on her face. She has a dressing over the scalp laceration. There is a lot of dried blood up on her forehead. Her right eye is closed. She is able to open her left eye a little bit more. There is no adenopathy in her neck. Lungs are clear bilaterally. I do not hear any wheezes. Cardiac exam is regular rate and rhythm with occasional extra beat. Abdomen is soft. There is no guarding or rebound tenderness. She has no obvious abdominal mass. Extremities shows no clubbing, cyanosis or edema.  Brooke Rios has metastatic renal cell carcinoma. She has been on Votrient. Unfortunately, it does not look like there is been a response.  Despite the fall and the large amount of ecchymoses, her overall performance status probably is not that bad. I think if we are to try second line therapy, it might be immunotherapy.  I spoke to her daughter yesterday on the phone about the results of the CT scan.  Mr. Gengler or to has appointment to see me next Wednesday. I would keep that appointment.  Lattie Haw, MD  Darlyn Chamber 30:19

## 2016-10-09 NOTE — Discharge Instructions (Signed)
Wash wound daily in shower with soap and water. You may trim off pieces of the bandage as it peels off. Apply antibiotic ointment (e.g. Neosporin) to exposed wound twice daily and as needed to keep moist.

## 2016-10-09 NOTE — Progress Notes (Signed)
Patient given discharge instructions and all questions answered.  Pt. Discharged with all belongings via wheelchair.

## 2016-10-09 NOTE — Progress Notes (Signed)
Patient ID: Brooke Rios, female   DOB: Nov 23, 1924, 82 y.o.   MRN: [id number]   LOS: 3 days   Subjective: Feeling better, ready to go home.   Objective: Vital signs in last 24 hours: Temp:  [97.1 F (36.2 C)-98.2 F (36.8 C)] 98.1 F (36.7 C) (11/21 0519) Pulse Rate:  [67-85] 76 (11/21 0519) Resp:  [18-20] 18 (11/21 0519) BP: (132-164)/(53-78) 148/78 (11/21 0519) SpO2:  [95 %-100 %] 96 % (11/21 0519) Weight:  [80.1 kg (176 lb 8 oz)] 80.1 kg (176 lb 8 oz) (11/21 0519) Last BM Date: 10/07/16   Physical Exam General appearance: alert and no distress Resp: clear to auscultation bilaterally Cardio: regular rate and rhythm GI: normal findings: bowel sounds normal and soft, non-tender Incision/Wound:C/D/I   Assessment/Plan: Fall Scalp lac -- Local care ABL anemia -- Will get Fe today Multiple medical problems -- Appreciate oncology f/u. Home meds. FEN -- SL IV VTE -- SCD's, Elequis Dispo -- D/C home after Veto Kemps, PA-C Pager: [phone] General Trauma PA Pager: [phone]  10/09/2016

## 2016-10-10 ENCOUNTER — Telehealth: Payer: Self-pay | Admitting: Family Medicine

## 2016-10-10 DIAGNOSIS — Z950 Presence of cardiac pacemaker: Secondary | ICD-10-CM | POA: Diagnosis not present

## 2016-10-10 DIAGNOSIS — I129 Hypertensive chronic kidney disease with stage 1 through stage 4 chronic kidney disease, or unspecified chronic kidney disease: Secondary | ICD-10-CM | POA: Diagnosis not present

## 2016-10-10 DIAGNOSIS — E785 Hyperlipidemia, unspecified: Secondary | ICD-10-CM | POA: Diagnosis not present

## 2016-10-10 DIAGNOSIS — C7931 Secondary malignant neoplasm of brain: Secondary | ICD-10-CM | POA: Diagnosis not present

## 2016-10-10 DIAGNOSIS — Z9181 History of falling: Secondary | ICD-10-CM | POA: Diagnosis not present

## 2016-10-10 DIAGNOSIS — K5790 Diverticulosis of intestine, part unspecified, without perforation or abscess without bleeding: Secondary | ICD-10-CM | POA: Diagnosis not present

## 2016-10-10 DIAGNOSIS — I482 Chronic atrial fibrillation: Secondary | ICD-10-CM | POA: Diagnosis not present

## 2016-10-10 DIAGNOSIS — M150 Primary generalized (osteo)arthritis: Secondary | ICD-10-CM | POA: Diagnosis not present

## 2016-10-10 DIAGNOSIS — N183 Chronic kidney disease, stage 3 (moderate): Secondary | ICD-10-CM | POA: Diagnosis not present

## 2016-10-10 DIAGNOSIS — C649 Malignant neoplasm of unspecified kidney, except renal pelvis: Secondary | ICD-10-CM | POA: Diagnosis not present

## 2016-10-10 DIAGNOSIS — I252 Old myocardial infarction: Secondary | ICD-10-CM | POA: Diagnosis not present

## 2016-10-10 DIAGNOSIS — D62 Acute posthemorrhagic anemia: Secondary | ICD-10-CM | POA: Diagnosis not present

## 2016-10-10 DIAGNOSIS — Z87440 Personal history of urinary (tract) infections: Secondary | ICD-10-CM | POA: Diagnosis not present

## 2016-10-10 DIAGNOSIS — S0101XD Laceration without foreign body of scalp, subsequent encounter: Secondary | ICD-10-CM | POA: Diagnosis not present

## 2016-10-10 DIAGNOSIS — I2510 Atherosclerotic heart disease of native coronary artery without angina pectoris: Secondary | ICD-10-CM | POA: Diagnosis not present

## 2016-10-10 DIAGNOSIS — M109 Gout, unspecified: Secondary | ICD-10-CM | POA: Diagnosis not present

## 2016-10-10 DIAGNOSIS — C7800 Secondary malignant neoplasm of unspecified lung: Secondary | ICD-10-CM | POA: Diagnosis not present

## 2016-10-10 NOTE — Telephone Encounter (Signed)
Called to give verbal order for Home Health PT 1-2 times per week for 3-4 weeks. No answer, left message approving PT per Dr. Lorelei Pont.

## 2016-10-10 NOTE — Telephone Encounter (Signed)
[facility] called requesting verbal orders for Home Health PT  1-2times per week for 3-4 weeks. Patient had a fall and was recently hospitalized for the fall. Please advise.    Phone: [phone]

## 2016-10-12 ENCOUNTER — Other Ambulatory Visit (HOSPITAL_BASED_OUTPATIENT_CLINIC_OR_DEPARTMENT_OTHER): Payer: Medicare Other

## 2016-10-12 ENCOUNTER — Ambulatory Visit (HOSPITAL_BASED_OUTPATIENT_CLINIC_OR_DEPARTMENT_OTHER): Payer: Medicare Other | Admitting: Hematology & Oncology

## 2016-10-12 VITALS — BP 179/67 | HR 64 | Temp 97.8°F | Resp 16 | Wt 179.0 lb

## 2016-10-12 DIAGNOSIS — C641 Malignant neoplasm of right kidney, except renal pelvis: Secondary | ICD-10-CM

## 2016-10-12 DIAGNOSIS — C7800 Secondary malignant neoplasm of unspecified lung: Secondary | ICD-10-CM | POA: Diagnosis not present

## 2016-10-12 DIAGNOSIS — C649 Malignant neoplasm of unspecified kidney, except renal pelvis: Secondary | ICD-10-CM

## 2016-10-12 DIAGNOSIS — C7801 Secondary malignant neoplasm of right lung: Principal | ICD-10-CM

## 2016-10-12 DIAGNOSIS — D62 Acute posthemorrhagic anemia: Secondary | ICD-10-CM

## 2016-10-12 LAB — CMP (CANCER CENTER ONLY)
ALT(SGPT): 25 U/L (ref 10–47)
AST: 33 U/L (ref 11–38)
Albumin: 3 g/dL — ABNORMAL LOW (ref 3.3–5.5)
Alkaline Phosphatase: 86 U/L — ABNORMAL HIGH (ref 26–84)
BUN, Bld: 18 mg/dL (ref 7–22)
CO2: 28 meq/L (ref 18–33)
Calcium: 9.1 mg/dL (ref 8.0–10.3)
Chloride: 103 meq/L (ref 98–108)
Creat: 1.4 mg/dL — ABNORMAL HIGH (ref 0.6–1.2)
Glucose, Bld: 121 mg/dL — ABNORMAL HIGH (ref 73–118)
Potassium: 4.3 meq/L (ref 3.3–4.7)
Sodium: 140 meq/L (ref 128–145)
Total Bilirubin: 0.7 mg/dL (ref 0.20–1.60)
Total Protein: 6.2 g/dL — ABNORMAL LOW (ref 6.4–8.1)

## 2016-10-12 LAB — CBC WITH DIFFERENTIAL (CANCER CENTER ONLY)
BASO#: 0 10e3/uL (ref 0.0–0.2)
BASO%: 0.2 % (ref 0.0–2.0)
EOS%: 1.1 % (ref 0.0–7.0)
Eosinophils Absolute: 0.1 10e3/uL (ref 0.0–0.5)
HCT: 33.4 % — ABNORMAL LOW (ref 34.8–46.6)
HGB: 10.7 g/dL — ABNORMAL LOW (ref 11.6–15.9)
LYMPH#: 1.1 10e3/uL (ref 0.9–3.3)
LYMPH%: 19.6 % (ref 14.0–48.0)
MCH: 30.5 pg (ref 26.0–34.0)
MCHC: 32 g/dL (ref 32.0–36.0)
MCV: 95 fL (ref 81–101)
MONO#: 0.4 10e3/uL (ref 0.1–0.9)
MONO%: 6.8 % (ref 0.0–13.0)
NEUT#: 4 10e3/uL (ref 1.5–6.5)
NEUT%: 72.3 % (ref 39.6–80.0)
Platelets: 195 10e3/uL (ref 145–400)
RBC: 3.51 10e6/uL — ABNORMAL LOW (ref 3.70–5.32)
RDW: 19.1 % — ABNORMAL HIGH (ref 11.1–15.7)
WBC: 5.5 10e3/uL (ref 3.9–10.0)

## 2016-10-12 NOTE — Progress Notes (Signed)
START ON PATHWAY REGIMEN - Renal Cell  [id number]: Nivolumab 240 mg q14 Days   A cycle is every 14 days:     Nivolumab (Opdivo(R)) 240 mg flat dose in 100 mL NS IV over 60 minutes. Inline filter required (low protein binding) Dose Mod: None Additional Orders: Severe immune-mediated reactions can occur (e.g. pneumonitis, colitis, and hepatitis). See prescribing information for more details including monitoring and required immediate management with steroids. Monitor thyroid, renal, liver  function tests, glucose, and sodium at baseline and periodically during therapy.  **Always confirm dose/schedule in your pharmacy ordering system**    Patient Characteristics: Metastatic, Clear Cell, Second Line, Prior Anti-Angiogenic Treatment AJCC Stage Grouping: IV Current evidence of distant metastases? Yes AJCC T Stage: X AJCC N Stage: X AJCC M Stage: X Does patient have oligometastatic disease? No Would you be surprised if this patient died  in the next year? I would be surprised if this patient died in the next year Histology: Clear Cell Line of therapy: Second Line  Intent of Therapy: Non-Curative / Palliative Intent, Discussed with Patient

## 2016-10-12 NOTE — Progress Notes (Signed)
Hematology and Oncology Follow Up Visit  Brooke Rios [id number] May 14, 1925 82 y.o. 10/12/2016   Principle Diagnosis:   Metastatic  RIGHT renal cell carcinoma-progressive  Paroxysmal atrial fibrillation  Current Therapy:    Votrient 400 mg by mouth daily  Nivolumab 270m IV q 2 week - start 10/30/2016  Status post stereotactic radiosurgery for solitary brain met  ELIQUIS 2.5 mg by mouth twice a day     Interim History:  Ms. BHatteryis back for follow-up. [facility] She fell at home and sustained a serious laceration on her forehead. She has extensive ecchymoses on her face. She has some ptosis of the right eye.  Thankfully, she did not need any surgery.  While she was in the hospital, we did a CT scan of her chest. This, unfortunately, showed that she had progressive disease with her renal cell carcinoma.  She still is doing okay from the renal cell carcinoma. She does not have any cough or shortness of breath. She's had no abdominal pain.  She now is back on low-dose ELIQUIS.  She has had no nausea or vomiting. She's had no change in bowel or bladder habits.  She did have a good Thanksgiving.  As always, her 2 daughters coming with her. They are incredibly attentive. Thankfully, one of her daughters was at her house when she fell and was able to get EMS quickly.  Overall, I sent her performance status is ECOG 2.  Medications:  Current Outpatient Prescriptions:  .  acetaminophen (TYLENOL) 500 MG tablet, Take 500 mg by mouth every 6 (six) hours as needed., Disp: , Rfl:  .  apixaban (ELIQUIS) 2.5 MG TABS tablet, Take 1 tablet (2.5 mg total) by mouth 2 (two) times daily., Disp: 180 tablet, Rfl: 3 .  Cyanocobalamin (VITAMIN B-12 SL), Place 1 tablet under the tongue daily., Disp: , Rfl:  .  docusate sodium (COLACE) 100 MG capsule, Take 100 mg by mouth 2 (two) times daily., Disp: , Rfl:  .  isosorbide mononitrate (IMDUR) 30 MG 24  hr tablet, TAKE 1 TABLET BY MOUTH TWICE A DAY, Disp: 90 tablet, Rfl: 0 .  metoprolol succinate (TOPROL-XL) 50 MG 24 hr tablet, Take 1 tablet (50 mg total) by mouth every morning. Take with or immediately following a meal. (Patient taking differently: Take 50 mg by mouth daily. Take with or immediately following a meal.), Disp: 90 tablet, Rfl: 3 .  nitroGLYCERIN (NITROSTAT) 0.4 MG SL tablet, Place 1 tablet (0.4 mg total) under the tongue every 5 (five) minutes as needed for chest pain (MAX 3 TABLETS)., Disp: 25 tablet, Rfl: 1 .  pazopanib (VOTRIENT) 200 MG tablet, Take 2 pills a day on an empty stomach (Patient taking differently: Take 400 mg by mouth daily before breakfast. Take on an empty stomach - one hour before eating), Disp: 60 tablet, Rfl: 3 .  polyethylene glycol (MIRALAX / GLYCOLAX) packet, Take 17 g by mouth daily as needed (constipation). Mix in 8 oz liquid and drink , Disp: , Rfl:  .  polyvinyl alcohol (LIQUIFILM TEARS) 1.4 % ophthalmic solution, Place 1 drop into both eyes every hour as needed for dry eyes., Disp: 15 mL, Rfl: 0 .  traMADol (ULTRAM) 50 MG tablet, Take 1 tablet (50 mg total) by mouth every 6 (six) hours as needed., Disp: 90 tablet, Rfl: 1  Allergies:  Allergies  Allergen Reactions  . Cefpodoxime Other (See Comments)    Nocturia & disturbed sleep  .  Codeine     nausea    Past Medical History, Surgical history, Social history, and Family History were reviewed and updated.  Review of Systems: Has above  Physical Exam:  weight is 179 lb (81.2 kg). Her oral temperature is 97.8 F (36.6 C). Her blood pressure is 179/67 (abnormal) and her pulse is 64. Her respiration is 16.   Wt Readings from Last 3 Encounters:  10/12/16 179 lb (81.2 kg)  10/09/16 176 lb 8 oz (80.1 kg)  09/24/16 180 lb (81.6 kg)     Elderly white female. She has extensive ecchymoses on her face. She has some ptosis of the right eye. She has a little bit of hemorrhage of the right conjunctiva.  There is no adenopathy in the neck. Her thyroid is nonpalpable. Lungs are clear bilaterally. Cardiac exam regular rate and rhythm with an occasional extra beat. There is no murmurs, rubs or bruits. Abdomen is soft. She has good bowel sounds. There is no fluid wave. There is no palpable liver or spleen tip. Back exam shows no tenderness over the spine, ribs or hips. Extremities shows some trace edema in her lower extremities. Skin exam shows the extensive facial ecchymoses. Neurological exam shows no focal neurological deficits.  Lab Results  Component Value Date   WBC 5.5 10/12/2016   HGB 10.7 (L) 10/12/2016   HCT 33.4 (L) 10/12/2016   MCV 95 10/12/2016   PLT 195 10/12/2016     Chemistry      Component Value Date/Time   NA 140 10/12/2016 1508   NA 137 08/08/2016 0948   K 4.3 10/12/2016 1508   K 4.2 08/08/2016 0948   CL 103 10/12/2016 1508   CO2 28 10/12/2016 1508   CO2 23 08/08/2016 0948   BUN 18 10/12/2016 1508   BUN 40.4 (H) 08/08/2016 0948   CREATININE 1.4 (H) 10/12/2016 1508   CREATININE 1.4 (H) 08/08/2016 0948      Component Value Date/Time   CALCIUM 9.1 10/12/2016 1508   CALCIUM 9.6 08/08/2016 0948   ALKPHOS 86 (H) 10/12/2016 1508   ALKPHOS 99 08/08/2016 0948   AST 33 10/12/2016 1508   AST 11 08/08/2016 0948   ALT 25 10/12/2016 1508   ALT 26 08/08/2016 0948   BILITOT 0.70 10/12/2016 1508   BILITOT 0.52 08/08/2016 0948         Impression and Plan: Brooke Rios is  an 82 year old white female. She has metastatic kidney cancer. This is clear-cell carcinoma. She presented with a solitary CNS met. This was treated with stereotactic radiosurgery. She has lung metastasis. She has the right kidney as a primary. On her recent CT scan looked like she had a left adrenal met.  I do think that she probably would benefit from second line therapy. I think that Nivolumab would be reasonable.  I talked to she and her 2 daughters about this. I explained to them how Nivolumab works. I  explained how the immune system is able to attack kidney cancer.  She and her daughters agree to Brooke Rios. She will need a Port-A-Cath replaced. She has very poor peripheral access. I spent of them a Port-A-Cath and how this will help make her life better.  I spent about 45 minutes with them today. This was pretty, K given that she had just fallen and had extensive ecchymoses. The laceration had a be sutured by surgery. She'll see them next week.  I still want to give her a couple weeks off. I told her not  to take any Votrient. This clearly is and has not been effective.  I think that the chance of no Vibra-Tabs helping will be about 35-40%. I think this is reasonable for her to try. I explained to her the side effects with respect to pneumonitis, diarrhea, elevated liver tests.  We will start on December 12. She'll have her Port-A-Cath placed on the 11th.  I will plan to treat her with 4 cycles and then repeat her scans. I think that for next set of scans shows continued progression, I don't think that anything else would be effective and at that point, I would definitely talked her about hospice. She and her daughters both know that no matter what kind of treatment we use, our goal is to prolong her life but also to give her quality of life.   Volanda Napoleon, MD 11/24/20174:54 PM

## 2016-10-15 DIAGNOSIS — C7800 Secondary malignant neoplasm of unspecified lung: Secondary | ICD-10-CM | POA: Diagnosis not present

## 2016-10-15 DIAGNOSIS — I2510 Atherosclerotic heart disease of native coronary artery without angina pectoris: Secondary | ICD-10-CM | POA: Diagnosis not present

## 2016-10-15 DIAGNOSIS — C649 Malignant neoplasm of unspecified kidney, except renal pelvis: Secondary | ICD-10-CM | POA: Diagnosis not present

## 2016-10-15 DIAGNOSIS — C7931 Secondary malignant neoplasm of brain: Secondary | ICD-10-CM | POA: Diagnosis not present

## 2016-10-15 DIAGNOSIS — I129 Hypertensive chronic kidney disease with stage 1 through stage 4 chronic kidney disease, or unspecified chronic kidney disease: Secondary | ICD-10-CM | POA: Diagnosis not present

## 2016-10-15 DIAGNOSIS — N183 Chronic kidney disease, stage 3 (moderate): Secondary | ICD-10-CM | POA: Diagnosis not present

## 2016-10-15 LAB — LACTATE DEHYDROGENASE: LDH: 211 U/L (ref 125–245)

## 2016-10-16 DIAGNOSIS — C44722 Squamous cell carcinoma of skin of right lower limb, including hip: Secondary | ICD-10-CM | POA: Diagnosis not present

## 2016-10-17 ENCOUNTER — Other Ambulatory Visit: Payer: Medicare Other

## 2016-10-17 ENCOUNTER — Ambulatory Visit (HOSPITAL_BASED_OUTPATIENT_CLINIC_OR_DEPARTMENT_OTHER): Payer: Medicare Other

## 2016-10-17 ENCOUNTER — Ambulatory Visit: Payer: Medicare Other | Admitting: Hematology & Oncology

## 2016-10-17 DIAGNOSIS — C7931 Secondary malignant neoplasm of brain: Secondary | ICD-10-CM | POA: Diagnosis not present

## 2016-10-17 DIAGNOSIS — C649 Malignant neoplasm of unspecified kidney, except renal pelvis: Secondary | ICD-10-CM | POA: Diagnosis not present

## 2016-10-17 DIAGNOSIS — C7800 Secondary malignant neoplasm of unspecified lung: Secondary | ICD-10-CM | POA: Diagnosis not present

## 2016-10-17 DIAGNOSIS — I2510 Atherosclerotic heart disease of native coronary artery without angina pectoris: Secondary | ICD-10-CM | POA: Diagnosis not present

## 2016-10-17 DIAGNOSIS — Z4802 Encounter for removal of sutures: Secondary | ICD-10-CM | POA: Diagnosis not present

## 2016-10-17 DIAGNOSIS — N183 Chronic kidney disease, stage 3 (moderate): Secondary | ICD-10-CM | POA: Diagnosis not present

## 2016-10-17 DIAGNOSIS — I129 Hypertensive chronic kidney disease with stage 1 through stage 4 chronic kidney disease, or unspecified chronic kidney disease: Secondary | ICD-10-CM | POA: Diagnosis not present

## 2016-10-18 DIAGNOSIS — C649 Malignant neoplasm of unspecified kidney, except renal pelvis: Secondary | ICD-10-CM | POA: Diagnosis not present

## 2016-10-18 DIAGNOSIS — I129 Hypertensive chronic kidney disease with stage 1 through stage 4 chronic kidney disease, or unspecified chronic kidney disease: Secondary | ICD-10-CM | POA: Diagnosis not present

## 2016-10-18 DIAGNOSIS — C7931 Secondary malignant neoplasm of brain: Secondary | ICD-10-CM | POA: Diagnosis not present

## 2016-10-18 DIAGNOSIS — N183 Chronic kidney disease, stage 3 (moderate): Secondary | ICD-10-CM | POA: Diagnosis not present

## 2016-10-18 DIAGNOSIS — C7800 Secondary malignant neoplasm of unspecified lung: Secondary | ICD-10-CM | POA: Diagnosis not present

## 2016-10-18 DIAGNOSIS — I2510 Atherosclerotic heart disease of native coronary artery without angina pectoris: Secondary | ICD-10-CM | POA: Diagnosis not present

## 2016-10-19 DIAGNOSIS — C7931 Secondary malignant neoplasm of brain: Secondary | ICD-10-CM | POA: Diagnosis not present

## 2016-10-19 DIAGNOSIS — N183 Chronic kidney disease, stage 3 (moderate): Secondary | ICD-10-CM | POA: Diagnosis not present

## 2016-10-19 DIAGNOSIS — I129 Hypertensive chronic kidney disease with stage 1 through stage 4 chronic kidney disease, or unspecified chronic kidney disease: Secondary | ICD-10-CM | POA: Diagnosis not present

## 2016-10-19 DIAGNOSIS — I2510 Atherosclerotic heart disease of native coronary artery without angina pectoris: Secondary | ICD-10-CM | POA: Diagnosis not present

## 2016-10-19 DIAGNOSIS — C649 Malignant neoplasm of unspecified kidney, except renal pelvis: Secondary | ICD-10-CM | POA: Diagnosis not present

## 2016-10-19 DIAGNOSIS — C7800 Secondary malignant neoplasm of unspecified lung: Secondary | ICD-10-CM | POA: Diagnosis not present

## 2016-10-22 ENCOUNTER — Other Ambulatory Visit: Payer: Self-pay | Admitting: Cardiology

## 2016-10-22 DIAGNOSIS — I129 Hypertensive chronic kidney disease with stage 1 through stage 4 chronic kidney disease, or unspecified chronic kidney disease: Secondary | ICD-10-CM | POA: Diagnosis not present

## 2016-10-22 DIAGNOSIS — C7931 Secondary malignant neoplasm of brain: Secondary | ICD-10-CM | POA: Diagnosis not present

## 2016-10-22 DIAGNOSIS — I2510 Atherosclerotic heart disease of native coronary artery without angina pectoris: Secondary | ICD-10-CM | POA: Diagnosis not present

## 2016-10-22 DIAGNOSIS — C7800 Secondary malignant neoplasm of unspecified lung: Secondary | ICD-10-CM | POA: Diagnosis not present

## 2016-10-22 DIAGNOSIS — N183 Chronic kidney disease, stage 3 (moderate): Secondary | ICD-10-CM | POA: Diagnosis not present

## 2016-10-22 DIAGNOSIS — C649 Malignant neoplasm of unspecified kidney, except renal pelvis: Secondary | ICD-10-CM | POA: Diagnosis not present

## 2016-10-23 ENCOUNTER — Other Ambulatory Visit: Payer: Self-pay | Admitting: Pharmacist

## 2016-10-23 DIAGNOSIS — C649 Malignant neoplasm of unspecified kidney, except renal pelvis: Secondary | ICD-10-CM

## 2016-10-24 DIAGNOSIS — I129 Hypertensive chronic kidney disease with stage 1 through stage 4 chronic kidney disease, or unspecified chronic kidney disease: Secondary | ICD-10-CM | POA: Diagnosis not present

## 2016-10-24 DIAGNOSIS — I2510 Atherosclerotic heart disease of native coronary artery without angina pectoris: Secondary | ICD-10-CM | POA: Diagnosis not present

## 2016-10-24 DIAGNOSIS — C649 Malignant neoplasm of unspecified kidney, except renal pelvis: Secondary | ICD-10-CM | POA: Diagnosis not present

## 2016-10-24 DIAGNOSIS — N183 Chronic kidney disease, stage 3 (moderate): Secondary | ICD-10-CM | POA: Diagnosis not present

## 2016-10-24 DIAGNOSIS — C7931 Secondary malignant neoplasm of brain: Secondary | ICD-10-CM | POA: Diagnosis not present

## 2016-10-24 DIAGNOSIS — C7800 Secondary malignant neoplasm of unspecified lung: Secondary | ICD-10-CM | POA: Diagnosis not present

## 2016-10-26 ENCOUNTER — Other Ambulatory Visit: Payer: Self-pay | Admitting: General Surgery

## 2016-10-26 DIAGNOSIS — C649 Malignant neoplasm of unspecified kidney, except renal pelvis: Secondary | ICD-10-CM | POA: Diagnosis not present

## 2016-10-26 DIAGNOSIS — C7800 Secondary malignant neoplasm of unspecified lung: Secondary | ICD-10-CM | POA: Diagnosis not present

## 2016-10-26 DIAGNOSIS — N183 Chronic kidney disease, stage 3 (moderate): Secondary | ICD-10-CM | POA: Diagnosis not present

## 2016-10-26 DIAGNOSIS — I129 Hypertensive chronic kidney disease with stage 1 through stage 4 chronic kidney disease, or unspecified chronic kidney disease: Secondary | ICD-10-CM | POA: Diagnosis not present

## 2016-10-26 DIAGNOSIS — C7931 Secondary malignant neoplasm of brain: Secondary | ICD-10-CM | POA: Diagnosis not present

## 2016-10-26 DIAGNOSIS — I2510 Atherosclerotic heart disease of native coronary artery without angina pectoris: Secondary | ICD-10-CM | POA: Diagnosis not present

## 2016-10-29 ENCOUNTER — Ambulatory Visit (HOSPITAL_COMMUNITY)
Admission: RE | Admit: 2016-10-29 | Discharge: 2016-10-29 | Disposition: A | Discharge status: Home / Self Care | Payer: Medicare Other | Source: Ambulatory Visit | Attending: Hematology & Oncology | Admitting: Hematology & Oncology

## 2016-10-29 ENCOUNTER — Encounter (HOSPITAL_COMMUNITY): Payer: Self-pay

## 2016-10-29 ENCOUNTER — Ambulatory Visit: Payer: Medicare Other

## 2016-10-29 ENCOUNTER — Telehealth (HOSPITAL_COMMUNITY): Payer: Self-pay

## 2016-10-29 ENCOUNTER — Other Ambulatory Visit: Payer: Self-pay | Admitting: Hematology & Oncology

## 2016-10-29 DIAGNOSIS — M109 Gout, unspecified: Secondary | ICD-10-CM | POA: Diagnosis not present

## 2016-10-29 DIAGNOSIS — D62 Acute posthemorrhagic anemia: Secondary | ICD-10-CM

## 2016-10-29 DIAGNOSIS — I480 Paroxysmal atrial fibrillation: Secondary | ICD-10-CM | POA: Diagnosis not present

## 2016-10-29 DIAGNOSIS — C7800 Secondary malignant neoplasm of unspecified lung: Principal | ICD-10-CM

## 2016-10-29 DIAGNOSIS — I495 Sick sinus syndrome: Secondary | ICD-10-CM | POA: Insufficient documentation

## 2016-10-29 DIAGNOSIS — C7931 Secondary malignant neoplasm of brain: Secondary | ICD-10-CM | POA: Insufficient documentation

## 2016-10-29 DIAGNOSIS — N189 Chronic kidney disease, unspecified: Secondary | ICD-10-CM | POA: Insufficient documentation

## 2016-10-29 DIAGNOSIS — M1990 Unspecified osteoarthritis, unspecified site: Secondary | ICD-10-CM | POA: Insufficient documentation

## 2016-10-29 DIAGNOSIS — Z452 Encounter for adjustment and management of vascular access device: Secondary | ICD-10-CM | POA: Diagnosis not present

## 2016-10-29 DIAGNOSIS — C649 Malignant neoplasm of unspecified kidney, except renal pelvis: Secondary | ICD-10-CM

## 2016-10-29 DIAGNOSIS — I2510 Atherosclerotic heart disease of native coronary artery without angina pectoris: Secondary | ICD-10-CM | POA: Insufficient documentation

## 2016-10-29 DIAGNOSIS — I252 Old myocardial infarction: Secondary | ICD-10-CM | POA: Insufficient documentation

## 2016-10-29 DIAGNOSIS — I129 Hypertensive chronic kidney disease with stage 1 through stage 4 chronic kidney disease, or unspecified chronic kidney disease: Secondary | ICD-10-CM | POA: Insufficient documentation

## 2016-10-29 DIAGNOSIS — E785 Hyperlipidemia, unspecified: Secondary | ICD-10-CM | POA: Diagnosis not present

## 2016-10-29 DIAGNOSIS — C641 Malignant neoplasm of right kidney, except renal pelvis: Secondary | ICD-10-CM

## 2016-10-29 DIAGNOSIS — Z7901 Long term (current) use of anticoagulants: Secondary | ICD-10-CM | POA: Diagnosis not present

## 2016-10-29 HISTORY — PX: IR GENERIC HISTORICAL: IMG1180011

## 2016-10-29 LAB — CBC WITH DIFFERENTIAL/PLATELET
BASOS PCT: 0 %
Basophils Absolute: 0 10*3/uL (ref 0.0–0.1)
EOS ABS: 0.1 10*3/uL (ref 0.0–0.7)
Eosinophils Relative: 1 %
HCT: 33.2 % — ABNORMAL LOW (ref 36.0–46.0)
HEMOGLOBIN: 10.4 g/dL — AB (ref 12.0–15.0)
LYMPHS ABS: 1.3 10*3/uL (ref 0.7–4.0)
Lymphocytes Relative: 20 %
MCH: 30 pg (ref 26.0–34.0)
MCHC: 31.3 g/dL (ref 30.0–36.0)
MCV: 95.7 fL (ref 78.0–100.0)
Monocytes Absolute: 0.5 10*3/uL (ref 0.1–1.0)
Monocytes Relative: 8 %
NEUTROS ABS: 4.5 10*3/uL (ref 1.7–7.7)
NEUTROS PCT: 71 %
Platelets: 257 10*3/uL (ref 150–400)
RBC: 3.47 MIL/uL — AB (ref 3.87–5.11)
RDW: 17.6 % — ABNORMAL HIGH (ref 11.5–15.5)
WBC: 6.4 10*3/uL (ref 4.0–10.5)

## 2016-10-29 LAB — BASIC METABOLIC PANEL
ANION GAP: 9 (ref 5–15)
BUN: 25 mg/dL — ABNORMAL HIGH (ref 6–20)
CALCIUM: 9.8 mg/dL (ref 8.9–10.3)
CHLORIDE: 102 mmol/L (ref 101–111)
CO2: 26 mmol/L (ref 22–32)
CREATININE: 1.26 mg/dL — AB (ref 0.44–1.00)
GFR calc non Af Amer: 37 mL/min — ABNORMAL LOW (ref >60)
GFR, EST AFRICAN AMERICAN: 43 mL/min — AB (ref >60)
Glucose, Bld: 126 mg/dL — ABNORMAL HIGH (ref 65–99)
Potassium: 4.2 mmol/L (ref 3.5–5.1)
SODIUM: 137 mmol/L (ref 135–145)

## 2016-10-29 LAB — PROTIME-INR
INR: 1.07
PROTHROMBIN TIME: 13.9 s (ref 11.4–15.2)

## 2016-10-29 MED ORDER — VANCOMYCIN HCL IN DEXTROSE 1-5 GM/200ML-% IV SOLN
1000.0000 mg | INTRAVENOUS | Status: AC
  Administered 2016-10-29: 1000 mg via INTRAVENOUS
  Filled 2016-10-29: qty 200

## 2016-10-29 MED ORDER — FENTANYL CITRATE (PF) 100 MCG/2ML IJ SOLN
INTRAMUSCULAR | Status: AC | PRN
  Administered 2016-10-29: 50 ug via INTRAVENOUS

## 2016-10-29 MED ORDER — SODIUM CHLORIDE 0.9 % IV SOLN
INTRAVENOUS | Status: DC
  Administered 2016-10-29: 13:00:00 via INTRAVENOUS

## 2016-10-29 MED ORDER — LIDOCAINE HCL 1 % IJ SOLN
INTRAMUSCULAR | Status: AC | PRN
  Administered 2016-10-29: 5 mL via INTRADERMAL

## 2016-10-29 MED ORDER — LIDOCAINE-EPINEPHRINE (PF) 2 %-1:200000 IJ SOLN
INTRAMUSCULAR | Status: AC | PRN
  Administered 2016-10-29: 10 mL via INTRADERMAL

## 2016-10-29 MED ORDER — LIDOCAINE-EPINEPHRINE (PF) 2 %-1:200000 IJ SOLN
INTRAMUSCULAR | Status: AC
  Filled 2016-10-29: qty 20

## 2016-10-29 MED ORDER — LIDOCAINE-PRILOCAINE 2.5-2.5 % EX CREA: 1.0000 "application " | TOPICAL_CREAM | CUTANEOUS | 3 refills | Status: DC | PRN

## 2016-10-29 MED ORDER — LIDOCAINE HCL 1 % IJ SOLN
INTRAMUSCULAR | Status: AC
  Filled 2016-10-29: qty 20

## 2016-10-29 MED ORDER — HEPARIN SOD (PORK) LOCK FLUSH 100 UNIT/ML IV SOLN
INTRAVENOUS | Status: AC | PRN
  Administered 2016-10-29: 500 [IU]

## 2016-10-29 MED ORDER — MIDAZOLAM HCL 2 MG/2ML IJ SOLN
INTRAMUSCULAR | Status: AC
  Filled 2016-10-29: qty 6

## 2016-10-29 MED ORDER — HEPARIN SOD (PORK) LOCK FLUSH 100 UNIT/ML IV SOLN
INTRAVENOUS | Status: AC
  Filled 2016-10-29: qty 5

## 2016-10-29 MED ORDER — MIDAZOLAM HCL 2 MG/2ML IJ SOLN
INTRAMUSCULAR | Status: AC | PRN
  Administered 2016-10-29 (×3): 1 mg via INTRAVENOUS

## 2016-10-29 MED ORDER — FENTANYL CITRATE (PF) 100 MCG/2ML IJ SOLN
INTRAMUSCULAR | Status: AC
  Filled 2016-10-29: qty 4

## 2016-10-29 NOTE — H&P (Signed)
Referring Physician(s): Ennever,Peter R  Supervising Physician: Daryll Brod  Patient Status:  [facility]  Chief Complaint:  "I'm getting a port a cath"  Subjective: Patient familiar to IR service from prior right renal mass biopsy in August of this year. She has known history of metastatic renal cell cancer as well as poor venous access. She presents today for Port-A-Cath placement for planned treatment. She currently denies fever, headache, chest pain, dyspnea, cough, abdominal/back pain, nausea, vomiting or abnormal bleeding. Past Medical History:  Diagnosis Date  . Chronic kidney disease 2011   creat 1.5   . Colonic polyp   . Coronary artery disease    Moderate three vessel obstructive coronary disease  . Diverticulosis of colon   . E. coli UTI 02/2013   uniformly sensitive  . Gout   . History of radiation therapy 07/09/2016   SRS treatment to Right frontal lobe brain metastatis  . Hyperlipidemia   . Hypertension   . Metastatic renal cell carcinoma to brain ([place]) 07/09/2016  . Metastatic renal cell carcinoma to lung ([place]) 07/09/2016  . Myocardial infarction 2012  . Osteoarthritis   . Sick sinus syndrome ([facility])    with paroxysmal atrial fibrillation  . Vertigo    Past Surgical History:  Procedure Laterality Date  . ABDOMINAL HYSTERECTOMY    . APPENDECTOMY     done at [facility]  . BREAST BIOPSY Left    x 1  . BREAST LUMPECTOMY Left 2009   Dr Margot Chimes  . CHOLECYSTECTOMY     age 56  . CHOLECYSTECTOMY    . COLONOSCOPY    . OOPHORECTOMY     bilat for fibroids @ age 39, anemia pre op due to dysfunctional menses yo  . PACEMAKER INSERTION  2011  . PAF induced MI, s/p pacer for Brady-tach syndrome  04/2010   Dr.jordan  . TONSILLECTOMY       Allergies: Cefpodoxime and Codeine  Medications: Prior to Admission medications   Medication Sig Start Date End Date Taking? Authorizing Provider  acetaminophen (TYLENOL) 500 MG tablet Take 500 mg by mouth every 6 (six) hours as  needed.    Historical Provider, MD  apixaban (ELIQUIS) 2.5 MG TABS tablet Take 1 tablet (2.5 mg total) by mouth 2 (two) times daily. 02/17/16   Peter M Martinique, MD  Cyanocobalamin (VITAMIN B-12 SL) Place 1 tablet under the tongue daily.    Historical Provider, MD  docusate sodium (COLACE) 100 MG capsule Take 100 mg by mouth 2 (two) times daily.    Historical Provider, MD  isosorbide mononitrate (IMDUR) 30 MG 24 hr tablet TAKE 1 TABLET BY MOUTH TWICE A DAY 10/22/16   Peter M Martinique, MD  lidocaine-prilocaine (EMLA) cream Apply 1 application topically as needed. 10/29/16   Volanda Napoleon, MD  metoprolol succinate (TOPROL-XL) 50 MG 24 hr tablet Take 1 tablet (50 mg total) by mouth every morning. Take with or immediately following a meal. Patient taking differently: Take 50 mg by mouth daily. Take with or immediately following a meal. 07/12/15   Peter M Martinique, MD  nitroGLYCERIN (NITROSTAT) 0.4 MG SL tablet Place 1 tablet (0.4 mg total) under the tongue every 5 (five) minutes as needed for chest pain (MAX 3 TABLETS). 11/08/15   Peter M Martinique, MD  polyethylene glycol [facility] / Floria Raveling) packet Take 17 g by mouth daily as needed (constipation). Mix in 8 oz liquid and drink     Historical Provider, MD  polyvinyl alcohol (LIQUIFILM TEARS) 1.4 %  ophthalmic solution Place 1 drop into both eyes every hour as needed for dry eyes. 10/09/16   Lisette Abu, PA-C  traMADol (ULTRAM) 50 MG tablet Take 1 tablet (50 mg total) by mouth every 6 (six) hours as needed. 07/16/16   Volanda Napoleon, MD     Vital Signs: pending    Physical Exam awake/alert; chest- CTA bilat; left chest wall pacer; heart- RRR; abd- soft,+BS,NT; lower ext- no edema; facial ecchymoses; rt forehead hematoma  Imaging: No results found.  Labs:  CBC:  Recent Labs  10/07/16 0208 10/08/16 0504 10/09/16 0602 10/12/16 1508  WBC 6.6 4.9 5.0 5.5  HGB 8.7* 7.3* 10.1* 10.7*  HCT 27.1* 23.0* 30.9* 33.4*  PLT 137* 124* 123* 195     COAGS:  Recent Labs  06/04/16 1630 06/19/16 1102  INR 1.32 1.08  APTT  --  27    BMP:  Recent Labs  09/24/16 1437 10/06/16 1405 10/07/16 0208 10/08/16 0504 10/12/16 1508  NA 127* 135 136 141 140  K 4.3 5.4* 4.7 4.6 4.3  CL 95* 104 106 112* 103  CO2 27 22 21* 24 28  GLUCOSE 146* 252* 119* 112* 121*  BUN 23 23* 23* 16 18  CALCIUM 9.6 8.7* 8.3* 8.6* 9.1  CREATININE 1.41* 1.35* 1.36* 1.25* 1.4*  GFRNONAA 33* 34* 34* 37*  --   GFRAA 38* 39* 39* 43*  --     LIVER FUNCTION TESTS:  Recent Labs  07/16/16 1147 08/08/16 0948 09/24/16 1437 10/12/16 1508  BILITOT 0.70 0.52 0.5 0.70  AST 15 11 38 33  ALT 28 26 57* 25  ALKPHOS 76 99 106 86*  PROT 6.0* 6.4 6.4 6.2*  ALBUMIN 2.6* 2.5* 3.7 3.0*    Assessment and Plan: Pt with known history of stage IV metastatic renal cell cancer as well as poor venous access. She presents today for Port-A-Cath placement for planned treatment.Risks and benefits discussed with the patient/daughters including, but not limited to bleeding, infection, pneumothorax, or fibrin sheath development and need for additional procedures.All of the patient's questions were answered, patient is agreeable to proceed.Consent signed and in chart. LABS PENDING.     Electronically Signed: D. Rowe Robert 10/29/2016, 1:04 PM   I spent a total of 20 minutes at the the patient's bedside AND on the patient's hospital floor or unit, greater than 50% of which was counseling/coordinating care for port a cath placement

## 2016-10-29 NOTE — Discharge Instructions (Signed)
Implanted Port Home Guide °An implanted port is a type of central line that is placed under the skin. Central lines are used to provide IV access when treatment or nutrition needs to be given through a person's veins. Implanted ports are used for long-term IV access. An implanted port may be placed because:  °· You need IV medicine that would be irritating to the small veins in your hands or arms.   °· You need long-term IV medicines, such as antibiotics.   °· You need IV nutrition for a long period.   °· You need frequent blood draws for lab tests.   °· You need dialysis.   °Implanted ports are usually placed in the chest area, but they can also be placed in the upper arm, the abdomen, or the leg. An implanted port has two main parts:  °· Reservoir. The reservoir is round and will appear as a small, raised area under your skin. The reservoir is the part where a needle is inserted to give medicines or draw blood.   °· Catheter. The catheter is a thin, flexible tube that extends from the reservoir. The catheter is placed into a large vein. Medicine that is inserted into the reservoir goes into the catheter and then into the vein.   °HOW WILL I CARE FOR MY INCISION SITE? °Do not get the incision site wet. Bathe or shower as directed by your health care provider.  °HOW IS MY PORT ACCESSED? °Special steps must be taken to access the port:  °· Before the port is accessed, a numbing cream can be placed on the skin. This helps numb the skin over the port site.   °· Your health care provider uses a sterile technique to access the port. °¨ Your health care provider must put on a mask and sterile gloves. °¨ The skin over your port is cleaned carefully with an antiseptic and allowed to dry. °¨ The port is gently pinched between sterile gloves, and a needle is inserted into the port. °· Only "non-coring" port needles should be used to access the port. Once the port is accessed, a blood return should be checked. This helps  ensure that the port is in the vein and is not clogged.   °· If your port needs to remain accessed for a constant infusion, a clear (transparent) bandage will be placed over the needle site. The bandage and needle will need to be changed every week, or as directed by your health care provider.   °· Keep the bandage covering the needle clean and dry. Do not get it wet. Follow your health care provider's instructions on how to take a shower or bath while the port is accessed.   °· If your port does not need to stay accessed, no bandage is needed over the port.   °WHAT IS FLUSHING? °Flushing helps keep the port from getting clogged. Follow your health care provider's instructions on how and when to flush the port. Ports are usually flushed with saline solution or a medicine called heparin. The need for flushing will depend on how the port is used.  °· If the port is used for intermittent medicines or blood draws, the port will need to be flushed:   °¨ After medicines have been given.   °¨ After blood has been drawn.   °¨ As part of routine maintenance.   °· If a constant infusion is running, the port may not need to be flushed.   °HOW LONG WILL MY PORT STAY IMPLANTED? °The port can stay in for as long as your health care   provider thinks it is needed. When it is time for the port to come out, surgery will be done to remove it. The procedure is similar to the one performed when the port was put in.  WHEN SHOULD I SEEK IMMEDIATE MEDICAL CARE? When you have an implanted port, you should seek immediate medical care if:   You notice a bad smell coming from the incision site.   You have swelling, redness, or drainage at the incision site.   You have more swelling or pain at the port site or the surrounding area.   You have a fever that is not controlled with medicine. This information is not intended to replace advice given to you by your health care provider. Make sure you discuss any questions you have with  your health care provider. Document Released: 11/05/2005 Document Revised: 08/26/2013 Document Reviewed: 07/13/2013 Elsevier Interactive Patient Education  2017 [place] Insertion, Care After Refer to this sheet in the next few weeks. These instructions provide you with information on caring for yourself after your procedure. Your health care provider may also give you more specific instructions. Your treatment has been planned according to current medical practices, but problems sometimes occur. Call your health care provider if you have any problems or questions after your procedure. WHAT TO EXPECT AFTER THE PROCEDURE After your procedure, it is typical to have the following:   Discomfort at the port insertion site. Ice packs to the area will help.  Bruising on the skin over the port. This will subside in 3-4 days. HOME CARE INSTRUCTIONS  After your port is placed, you will get a manufacturer's information card. The card has information about your port. Keep this card with you at all times.   Know what kind of port you have. There are many types of ports available.   Wear a medical alert bracelet in case of an emergency. This can help alert health care workers that you have a port.   The port can stay in for as long as your health care provider believes it is necessary.   A home health care nurse may give medicines and take care of the port.   You or a family member can get special training and directions for giving medicine and taking care of the port at home.  SEEK MEDICAL CARE IF:   Your port does not flush or you are unable to get a blood return.   You have a fever or chills. SEEK IMMEDIATE MEDICAL CARE IF:  You have new fluid or pus coming from your incision.   You notice a bad smell coming from your incision site.   You have swelling, pain, or more redness at the incision or port site.   You have chest pain or shortness of breath. This  information is not intended to replace advice given to you by your health care provider. Make sure you discuss any questions you have with your health care provider. Document Released: 08/26/2013 Document Revised: 11/10/2013 Document Reviewed: 08/26/2013 Elsevier Interactive Patient Education  2017 [place].   Moderate Conscious Sedation, Adult, Care After These instructions provide you with information about caring for yourself after your procedure. Your health care provider may also give you more specific instructions. Your treatment has been planned according to current medical practices, but problems sometimes occur. Call your health care provider if you have any problems or questions after your procedure. What can I expect after the procedure? After your procedure, it is  common:  To feel sleepy for several hours.  To feel clumsy and have poor balance for several hours.  To have poor judgment for several hours.  To vomit if you eat too soon. Follow these instructions at home: For at least 24 hours after the procedure:   Do not:  Participate in activities where you could fall or become injured.  Drive.  Use heavy machinery.  Drink alcohol.  Take sleeping pills or medicines that cause drowsiness.  Make important decisions or sign legal documents.  Take care of children on your own.  Rest. Eating and drinking  Follow the diet recommended by your health care provider.  If you vomit:  Drink water, juice, or soup when you can drink without vomiting.  Make sure you have little or no nausea before eating solid foods. General instructions  Have a responsible adult stay with you until you are awake and alert.  Take over-the-counter and prescription medicines only as told by your health care provider.  If you smoke, do not smoke without supervision.  Keep all follow-up visits as told by your health care provider. This is important. Contact a health care provider  if:  You keep feeling nauseous or you keep vomiting.  You feel light-headed.  You develop a rash.  You have a fever. Get help right away if:  You have trouble breathing. This information is not intended to replace advice given to you by your health care provider. Make sure you discuss any questions you have with your health care provider. Document Released: 08/26/2013 Document Revised: 04/09/2016 Document Reviewed: 02/25/2016 Elsevier Interactive Patient Education  2017 [employer].

## 2016-10-29 NOTE — Telephone Encounter (Signed)
Called to express concern that they were coming back to clinic but weren't going to see a MD. I explained that that was just the way the clinic was set up and they'd be welcome to see a MD but it would require rescheduling her appt. I told her that [facility] would give her a call and see if she wanted to resched with a MD or keep her appt.

## 2016-10-29 NOTE — Procedures (Signed)
Metastatic renal cell ca  S/p RT IJ POWER PORT  No comp Tip svcra Ready for use Full report in PACS

## 2016-10-30 ENCOUNTER — Ambulatory Visit (HOSPITAL_BASED_OUTPATIENT_CLINIC_OR_DEPARTMENT_OTHER): Payer: Medicare Other | Admitting: Hematology & Oncology

## 2016-10-30 ENCOUNTER — Ambulatory Visit (HOSPITAL_BASED_OUTPATIENT_CLINIC_OR_DEPARTMENT_OTHER): Payer: Medicare Other

## 2016-10-30 ENCOUNTER — Other Ambulatory Visit (HOSPITAL_BASED_OUTPATIENT_CLINIC_OR_DEPARTMENT_OTHER): Payer: Medicare Other

## 2016-10-30 ENCOUNTER — Ambulatory Visit: Payer: Medicare Other

## 2016-10-30 VITALS — BP 134/59 | HR 71 | Temp 97.8°F | Resp 16 | Wt 176.0 lb

## 2016-10-30 DIAGNOSIS — Z5112 Encounter for antineoplastic immunotherapy: Secondary | ICD-10-CM

## 2016-10-30 DIAGNOSIS — I480 Paroxysmal atrial fibrillation: Secondary | ICD-10-CM

## 2016-10-30 DIAGNOSIS — C7800 Secondary malignant neoplasm of unspecified lung: Principal | ICD-10-CM

## 2016-10-30 DIAGNOSIS — C649 Malignant neoplasm of unspecified kidney, except renal pelvis: Secondary | ICD-10-CM

## 2016-10-30 DIAGNOSIS — D62 Acute posthemorrhagic anemia: Secondary | ICD-10-CM

## 2016-10-30 DIAGNOSIS — C641 Malignant neoplasm of right kidney, except renal pelvis: Secondary | ICD-10-CM

## 2016-10-30 DIAGNOSIS — J0110 Acute frontal sinusitis, unspecified: Secondary | ICD-10-CM

## 2016-10-30 LAB — CMP (CANCER CENTER ONLY)
ALT(SGPT): 19 U/L (ref 10–47)
AST: 21 U/L (ref 11–38)
Albumin: 2.9 g/dL — ABNORMAL LOW (ref 3.3–5.5)
Alkaline Phosphatase: 98 U/L — ABNORMAL HIGH (ref 26–84)
BILIRUBIN TOTAL: 0.6 mg/dL (ref 0.20–1.60)
BUN, Bld: 20 mg/dL (ref 7–22)
CALCIUM: 10.1 mg/dL (ref 8.0–10.3)
CO2: 27 meq/L (ref 18–33)
Chloride: 101 mEq/L (ref 98–108)
Creat: 1.4 mg/dl — ABNORMAL HIGH (ref 0.6–1.2)
GLUCOSE: 142 mg/dL — AB (ref 73–118)
Potassium: 4.1 mEq/L (ref 3.3–4.7)
Sodium: 136 mEq/L (ref 128–145)
Total Protein: 6.7 g/dL (ref 6.4–8.1)

## 2016-10-30 LAB — CBC WITH DIFFERENTIAL (CANCER CENTER ONLY)
BASO#: 0 10*3/uL (ref 0.0–0.2)
BASO%: 0.2 % (ref 0.0–2.0)
EOS%: 0.6 % (ref 0.0–7.0)
Eosinophils Absolute: 0 10*3/uL (ref 0.0–0.5)
HEMATOCRIT: 31.7 % — AB (ref 34.8–46.6)
HGB: 10 g/dL — ABNORMAL LOW (ref 11.6–15.9)
LYMPH#: 1.2 10*3/uL (ref 0.9–3.3)
LYMPH%: 18.5 % (ref 14.0–48.0)
MCH: 30.3 pg (ref 26.0–34.0)
MCHC: 31.5 g/dL — AB (ref 32.0–36.0)
MCV: 96 fL (ref 81–101)
MONO#: 0.5 10*3/uL (ref 0.1–0.9)
MONO%: 8.2 % (ref 0.0–13.0)
NEUT#: 4.6 10*3/uL (ref 1.5–6.5)
NEUT%: 72.5 % (ref 39.6–80.0)
Platelets: 244 10*3/uL (ref 145–400)
RBC: 3.3 10*6/uL — ABNORMAL LOW (ref 3.70–5.32)
RDW: 17.6 % — AB (ref 11.1–15.7)
WBC: 6.4 10*3/uL (ref 3.9–10.0)

## 2016-10-30 LAB — LACTATE DEHYDROGENASE: LDH: 176 U/L (ref 125–245)

## 2016-10-30 MED ORDER — AZITHROMYCIN 250 MG PO TABS: ORAL_TABLET | ORAL | 0 refills | Status: DC

## 2016-10-30 MED ORDER — HEPARIN SOD (PORK) LOCK FLUSH 100 UNIT/ML IV SOLN
500.0000 [IU] | Freq: Once | INTRAVENOUS | Status: AC | PRN
  Administered 2016-10-30: 500 [IU]
  Filled 2016-10-30: qty 5

## 2016-10-30 MED ORDER — SODIUM CHLORIDE 0.9 % IV SOLN
240.0000 mg | Freq: Once | INTRAVENOUS | Status: AC
  Administered 2016-10-30: 240 mg via INTRAVENOUS
  Filled 2016-10-30: qty 20

## 2016-10-30 MED ORDER — SODIUM CHLORIDE 0.9 % IV SOLN
Freq: Once | INTRAVENOUS | Status: AC
  Administered 2016-10-30: 13:00:00 via INTRAVENOUS

## 2016-10-30 MED ORDER — SODIUM CHLORIDE 0.9% FLUSH
10.0000 mL | INTRAVENOUS | Status: DC | PRN
  Administered 2016-10-30: 10 mL
  Filled 2016-10-30: qty 10

## 2016-10-30 NOTE — Progress Notes (Signed)
Hematology and Oncology Follow Up Visit  Brooke Rios [id number] Mar 24, 1925 82 y.o. 10/30/2016   Principle Diagnosis:   Metastatic  RIGHT renal cell carcinoma-progressive  Paroxysmal atrial fibrillation  Current Therapy:    Votrient 400 mg by mouth daily  Nivolumab 239m IV q 2 week - start 10/30/2016  Status post stereotactic radiosurgery for solitary brain met  ELIQUIS 2.5 mg by mouth twice a day     Interim History:  Ms. BNeitzkeis back for follow-up. She looks a lot better. The ecchymoses on her face is not nearly as extensive.  She has had no problems with cough or shortness of breath. She's had a little bit of a cough. She is little bit of sinus congestion. I will go ahead and give her a Z-Pak. She will be going on a church trip this Friday. I want to make sure that she is okay to go.  She's had a decent appetite. She's had no nausea or vomiting. She's had no diarrhea. She's had some constipation.   We are going ahead and start the Nivolumab today.  Overall, I sent her performance status is ECOG 1.  Medications:  Current Outpatient Prescriptions:  .  acetaminophen (TYLENOL) 500 MG tablet, Take 500 mg by mouth every 6 (six) hours as needed., Disp: , Rfl:  .  apixaban (ELIQUIS) 2.5 MG TABS tablet, Take 1 tablet (2.5 mg total) by mouth 2 (two) times daily., Disp: 180 tablet, Rfl: 3 .  Cyanocobalamin (VITAMIN B-12 SL), Place 1 tablet under the tongue daily., Disp: , Rfl:  .  docusate sodium (COLACE) 100 MG capsule, Take 100 mg by mouth 2 (two) times daily., Disp: , Rfl:  .  isosorbide mononitrate (IMDUR) 30 MG 24 hr tablet, TAKE 1 TABLET BY MOUTH TWICE A DAY, Disp: 90 tablet, Rfl: 0 .  lidocaine-prilocaine (EMLA) cream, Apply 1 application topically as needed., Disp: 30 g, Rfl: 3 .  metoprolol succinate (TOPROL-XL) 50 MG 24 hr tablet, Take 1 tablet (50 mg total) by mouth every morning. Take with or immediately following a meal. (Patient taking differently: Take 50 mg by  mouth daily. Take with or immediately following a meal.), Disp: 90 tablet, Rfl: 3 .  nitroGLYCERIN (NITROSTAT) 0.4 MG SL tablet, Place 1 tablet (0.4 mg total) under the tongue every 5 (five) minutes as needed for chest pain (MAX 3 TABLETS)., Disp: 25 tablet, Rfl: 1 .  polyethylene glycol (MIRALAX / GLYCOLAX) packet, Take 17 g by mouth daily as needed (constipation). Mix in 8 oz liquid and drink , Disp: , Rfl:  .  polyvinyl alcohol (LIQUIFILM TEARS) 1.4 % ophthalmic solution, Place 1 drop into both eyes every hour as needed for dry eyes., Disp: 15 mL, Rfl: 0 .  traMADol (ULTRAM) 50 MG tablet, Take 1 tablet (50 mg total) by mouth every 6 (six) hours as needed., Disp: 90 tablet, Rfl: 1  Allergies:  Allergies  Allergen Reactions  . Cefpodoxime Other (See Comments)    Nocturia & disturbed sleep  . Codeine     nausea    Past Medical History, Surgical history, Social history, and Family History were reviewed and updated.  Review of Systems: Has above  Physical Exam:  weight is 176 lb (79.8 kg). Her oral temperature is 97.8 F (36.6 C). Her blood pressure is 134/59 (abnormal) and her pulse is 71. Her respiration is 16.   Wt Readings from Last 3 Encounters:  10/30/16 176 lb (79.8 kg)  10/12/16 179 lb (81.2 kg)  10/09/16  176 lb 8 oz (80.1 kg)     Elderly white female. She has extensive ecchymoses on her face. She has some ptosis of the right eye. She has a little bit of hemorrhage of the right conjunctiva. There is no adenopathy in the neck. Her thyroid is nonpalpable. Lungs are clear bilaterally. Cardiac exam regular rate and rhythm with an occasional extra beat. There is no murmurs, rubs or bruits. Abdomen is soft. She has good bowel sounds. There is no fluid wave. There is no palpable liver or spleen tip. Back exam shows no tenderness over the spine, ribs or hips. Extremities shows some trace edema in her lower extremities. Skin exam shows the extensive facial ecchymoses. Neurological exam  shows no focal neurological deficits.  Lab Results  Component Value Date   WBC 6.4 10/30/2016   HGB 10.0 (L) 10/30/2016   HCT 31.7 (L) 10/30/2016   MCV 96 10/30/2016   PLT 244 10/30/2016     Chemistry      Component Value Date/Time   NA 136 10/30/2016 1047   NA 137 08/08/2016 0948   K 4.1 10/30/2016 1047   K 4.2 08/08/2016 0948   CL 101 10/30/2016 1047   CO2 27 10/30/2016 1047   CO2 23 08/08/2016 0948   BUN 20 10/30/2016 1047   BUN 40.4 (H) 08/08/2016 0948   CREATININE 1.4 (H) 10/30/2016 1047   CREATININE 1.4 (H) 08/08/2016 0948      Component Value Date/Time   CALCIUM 10.1 10/30/2016 1047   CALCIUM 9.6 08/08/2016 0948   ALKPHOS 98 (H) 10/30/2016 1047   ALKPHOS 99 08/08/2016 0948   AST 21 10/30/2016 1047   AST 11 08/08/2016 0948   ALT 19 10/30/2016 1047   ALT 26 08/08/2016 0948   BILITOT 0.60 10/30/2016 1047   BILITOT 0.52 08/08/2016 0948         Impression and Plan: Brooke Rios is  an 82 year old white female. She has metastatic kidney cancer. This is clear-cell carcinoma. She presented with a solitary CNS met. This was treated with stereotactic radiosurgery. She has lung metastasis. She has the right kidney as a primary. On her recent CT scan looked like she had a left adrenal met.  I'm glad that she is feeling better. She looks better.  We will go ahead and treat with the Nivolumab today.  She did have her Port-A-Cath placed yesterday. This looks good.  I answered any questions that she or her daughters had. I explained them again what palivizumab does. It does not suppress the body's immune system. Again, this would make it easier for her to go on the chart trip and be safe from infection. I think the Z-Pak will be a good idea.  We'll plan to get her back in 2 more weeks. This will be the second cycle. I probably would give her 4 cycles and then repeat her scans.   We just want her to have a decent quality of life.   Volanda Napoleon, MD 12/12/201712:06  PM

## 2016-10-30 NOTE — Patient Instructions (Signed)
Nivolumab injection What is this medicine? NIVOLUMAB (nye VOL ue mab) is a monoclonal antibody. It is used to treat melanoma, lung cancer, kidney cancer, head and neck cancer, Hodgkin lymphoma, and urothelial cancer. COMMON BRAND NAME(S): Opdivo What should I tell my health care provider before I take this medicine? They need to know if you have any of these conditions: -diabetes -immune system problems -kidney disease -liver disease -lung disease -organ transplant -stomach or intestine problems -thyroid disease -an unusual or allergic reaction to nivolumab, other medicines, foods, dyes, or preservatives -pregnant or trying to get pregnant -breast-feeding How should I use this medicine? This medicine is for infusion into a vein. It is given by a health care professional in a hospital or clinic setting. A special MedGuide will be given to you before each treatment. Be sure to read this information carefully each time. Talk to your pediatrician regarding the use of this medicine in children. Special care may be needed. What if I miss a dose? It is important not to miss your dose. Call your doctor or health care professional if you are unable to keep an appointment. What may interact with this medicine? Interactions have not been studied. Give your health care provider a list of all the medicines, herbs, non-prescription drugs, or dietary supplements you use. Also tell them if you smoke, drink alcohol, or use illegal drugs. Some items may interact with your medicine. What should I watch for while using this medicine? This drug may make you feel generally unwell. Continue your course of treatment even though you feel ill unless your doctor tells you to stop. You may need blood work done while you are taking this medicine. Do not become pregnant while taking this medicine or for 5 months after stopping it. Women should inform their doctor if they wish to become pregnant or think they might be  pregnant. There is a potential for serious side effects to an unborn child. Talk to your health care professional or pharmacist for more information. Do not breast-feed an infant while taking this medicine. What side effects may I notice from receiving this medicine? Side effects that you should report to your doctor or health care professional as soon as possible: -allergic reactions like skin rash, itching or hives, swelling of the face, lips, or tongue -black, tarry stools -blood in the urine -bloody or watery diarrhea -changes in vision -change in sex drive -changes in emotions or moods -chest pain -confusion -cough -decreased appetite -diarrhea -facial flushing -feeling faint or lightheaded -fever, chills -hair loss -hallucination, loss of contact with reality -headache -irritable -joint pain -loss of memory -muscle pain -muscle weakness -seizures -shortness of breath -signs and symptoms of high blood sugar such as dizziness; dry mouth; dry skin; fruity breath; nausea; stomach pain; increased hunger or thirst; increased urination -signs and symptoms of kidney injury like trouble passing urine or change in the amount of urine -signs and symptoms of liver injury like dark yellow or Lacerte urine; general ill feeling or flu-like symptoms; light-colored stools; loss of appetite; nausea; right upper belly pain; unusually weak or tired; yellowing of the eyes or skin -stiff neck -swelling of the ankles, feet, hands -weight gain Side effects that usually do not require medical attention (report to your doctor or health care professional if they continue or are bothersome): -bone pain -constipation -tiredness -vomiting Where should I keep my medicine? This drug is given in a hospital or clinic and will not be stored at home.  2017 Elsevier/Gold  Standard (2015-12-23 09:04:36) [facility] Discharge Instructions for Patients Receiving Chemotherapy  Today you received  the following chemotherapy agents nivolumab  To help prevent nausea and vomiting after your treatment, we encourage you to take your nausea medication Begin taking it at  and take it as often as prescribed for the next  hours.   If you develop nausea and vomiting that is not controlled by your nausea medication, call the clinic. If it is after clinic hours your family physician or the after hours number for the clinic or go to the Emergency Department.   BELOW ARE SYMPTOMS THAT SHOULD BE REPORTED IMMEDIATELY:  *FEVER GREATER THAN 101.0 F  *CHILLS WITH OR WITHOUT FEVER  NAUSEA AND VOMITING THAT IS NOT CONTROLLED WITH YOUR NAUSEA MEDICATION  *UNUSUAL SHORTNESS OF BREATH  *UNUSUAL BRUISING OR BLEEDING  TENDERNESS IN MOUTH AND THROAT WITH OR WITHOUT PRESENCE OF ULCERS  *URINARY PROBLEMS  *BOWEL PROBLEMS  UNUSUAL RASH Items with  indicate a potential emergency and should be followed up as soon as possible.  One of the nurses will contact you 24 hours after your treatment. Please let the nurse know about any problems that you may have experienced. Feel free to call the clinic you have any questions or concerns. The clinic phone number is [phone].   I have been informed and understand all the instructions given to me. I know to contact the clinic, my physician, or go to the Emergency Department if any problems should occur. I do not have any questions at this time, but understand that I may call the clinic during office hours or the Patient Navigator at [phone] should I have any questions or need assistance in obtaining follow up care.    __________________________________________  _____________  __________ Signature of Patient or Authorized Representative            Date                   Time    __________________________________________ Nurse's Signature

## 2016-10-31 ENCOUNTER — Telehealth: Payer: Self-pay | Admitting: *Deleted

## 2016-10-31 DIAGNOSIS — C649 Malignant neoplasm of unspecified kidney, except renal pelvis: Secondary | ICD-10-CM | POA: Diagnosis not present

## 2016-10-31 DIAGNOSIS — S0101XS Laceration without foreign body of scalp, sequela: Secondary | ICD-10-CM | POA: Diagnosis not present

## 2016-10-31 DIAGNOSIS — I2510 Atherosclerotic heart disease of native coronary artery without angina pectoris: Secondary | ICD-10-CM | POA: Diagnosis not present

## 2016-10-31 DIAGNOSIS — C7931 Secondary malignant neoplasm of brain: Secondary | ICD-10-CM | POA: Diagnosis not present

## 2016-10-31 DIAGNOSIS — N183 Chronic kidney disease, stage 3 (moderate): Secondary | ICD-10-CM | POA: Diagnosis not present

## 2016-10-31 DIAGNOSIS — C7800 Secondary malignant neoplasm of unspecified lung: Secondary | ICD-10-CM | POA: Diagnosis not present

## 2016-10-31 DIAGNOSIS — I129 Hypertensive chronic kidney disease with stage 1 through stage 4 chronic kidney disease, or unspecified chronic kidney disease: Secondary | ICD-10-CM | POA: Diagnosis not present

## 2016-10-31 NOTE — Telephone Encounter (Signed)
Spoke with patient's daughter. Patient is doing well. She has no complaints. She was not prescribed any prn medications.   At this time they have no questions or concerns. They know to call the office if patient starts to have any side effects, they have any questions or the patient develops diarrhea.

## 2016-11-01 ENCOUNTER — Encounter: Payer: Self-pay | Admitting: Hematology & Oncology

## 2016-11-01 DIAGNOSIS — I129 Hypertensive chronic kidney disease with stage 1 through stage 4 chronic kidney disease, or unspecified chronic kidney disease: Secondary | ICD-10-CM | POA: Diagnosis not present

## 2016-11-01 DIAGNOSIS — C7931 Secondary malignant neoplasm of brain: Secondary | ICD-10-CM | POA: Diagnosis not present

## 2016-11-01 DIAGNOSIS — I2510 Atherosclerotic heart disease of native coronary artery without angina pectoris: Secondary | ICD-10-CM | POA: Diagnosis not present

## 2016-11-01 DIAGNOSIS — C649 Malignant neoplasm of unspecified kidney, except renal pelvis: Secondary | ICD-10-CM | POA: Diagnosis not present

## 2016-11-01 DIAGNOSIS — C7800 Secondary malignant neoplasm of unspecified lung: Secondary | ICD-10-CM | POA: Diagnosis not present

## 2016-11-01 DIAGNOSIS — N183 Chronic kidney disease, stage 3 (moderate): Secondary | ICD-10-CM | POA: Diagnosis not present

## 2016-11-05 ENCOUNTER — Other Ambulatory Visit: Payer: Self-pay

## 2016-11-05 DIAGNOSIS — C649 Malignant neoplasm of unspecified kidney, except renal pelvis: Secondary | ICD-10-CM

## 2016-11-05 DIAGNOSIS — C7800 Secondary malignant neoplasm of unspecified lung: Principal | ICD-10-CM

## 2016-11-08 ENCOUNTER — Encounter: Payer: Self-pay | Admitting: Hematology & Oncology

## 2016-11-09 DIAGNOSIS — M1712 Unilateral primary osteoarthritis, left knee: Secondary | ICD-10-CM | POA: Diagnosis not present
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
## 2016-11-13 ENCOUNTER — Ambulatory Visit (HOSPITAL_BASED_OUTPATIENT_CLINIC_OR_DEPARTMENT_OTHER): Payer: Medicare Other | Admitting: Hematology & Oncology

## 2016-11-13 ENCOUNTER — Ambulatory Visit (HOSPITAL_BASED_OUTPATIENT_CLINIC_OR_DEPARTMENT_OTHER): Payer: Medicare Other

## 2016-11-13 ENCOUNTER — Other Ambulatory Visit (HOSPITAL_BASED_OUTPATIENT_CLINIC_OR_DEPARTMENT_OTHER): Payer: Medicare Other

## 2016-11-13 ENCOUNTER — Ambulatory Visit: Payer: Medicare Other

## 2016-11-13 VITALS — BP 141/63 | HR 69 | Temp 97.7°F | Resp 18 | Wt 177.0 lb

## 2016-11-13 DIAGNOSIS — Z79899 Other long term (current) drug therapy: Secondary | ICD-10-CM | POA: Diagnosis not present

## 2016-11-13 DIAGNOSIS — C7800 Secondary malignant neoplasm of unspecified lung: Principal | ICD-10-CM

## 2016-11-13 DIAGNOSIS — C649 Malignant neoplasm of unspecified kidney, except renal pelvis: Secondary | ICD-10-CM | POA: Diagnosis not present

## 2016-11-13 DIAGNOSIS — R58 Hemorrhage, not elsewhere classified: Secondary | ICD-10-CM

## 2016-11-13 DIAGNOSIS — C7931 Secondary malignant neoplasm of brain: Secondary | ICD-10-CM

## 2016-11-13 DIAGNOSIS — I480 Paroxysmal atrial fibrillation: Secondary | ICD-10-CM

## 2016-11-13 DIAGNOSIS — Z7901 Long term (current) use of anticoagulants: Secondary | ICD-10-CM | POA: Diagnosis not present

## 2016-11-13 DIAGNOSIS — Z5112 Encounter for antineoplastic immunotherapy: Secondary | ICD-10-CM | POA: Diagnosis not present

## 2016-11-13 DIAGNOSIS — D62 Acute posthemorrhagic anemia: Secondary | ICD-10-CM

## 2016-11-13 DIAGNOSIS — J0110 Acute frontal sinusitis, unspecified: Secondary | ICD-10-CM

## 2016-11-13 DIAGNOSIS — C641 Malignant neoplasm of right kidney, except renal pelvis: Secondary | ICD-10-CM

## 2016-11-13 DIAGNOSIS — C7801 Secondary malignant neoplasm of right lung: Principal | ICD-10-CM

## 2016-11-13 LAB — CMP (CANCER CENTER ONLY)
ALT(SGPT): 17 U/L (ref 10–47)
AST: 22 U/L (ref 11–38)
Albumin: 3 g/dL — ABNORMAL LOW (ref 3.3–5.5)
Alkaline Phosphatase: 95 U/L — ABNORMAL HIGH (ref 26–84)
BILIRUBIN TOTAL: 0.7 mg/dL (ref 0.20–1.60)
BUN, Bld: 25 mg/dL — ABNORMAL HIGH (ref 7–22)
CALCIUM: 10.4 mg/dL — AB (ref 8.0–10.3)
CHLORIDE: 103 meq/L (ref 98–108)
CO2: 28 meq/L (ref 18–33)
Creat: 1.5 mg/dl — ABNORMAL HIGH (ref 0.6–1.2)
GLUCOSE: 152 mg/dL — AB (ref 73–118)
POTASSIUM: 4.1 meq/L (ref 3.3–4.7)
Sodium: 138 mEq/L (ref 128–145)
Total Protein: 7.2 g/dL (ref 6.4–8.1)

## 2016-11-13 LAB — IRON AND TIBC
%SAT: 15 % — AB (ref 21–57)
IRON: 40 ug/dL — AB (ref 41–142)
TIBC: 261 ug/dL (ref 236–444)
UIBC: 221 ug/dL (ref 120–384)

## 2016-11-13 LAB — CBC WITH DIFFERENTIAL (CANCER CENTER ONLY)
BASO#: 0 10*3/uL (ref 0.0–0.2)
BASO%: 0.3 % (ref 0.0–2.0)
EOS ABS: 0.1 10*3/uL (ref 0.0–0.5)
EOS%: 0.9 % (ref 0.0–7.0)
HEMATOCRIT: 31.9 % — AB (ref 34.8–46.6)
HGB: 10 g/dL — ABNORMAL LOW (ref 11.6–15.9)
LYMPH#: 0.9 10*3/uL (ref 0.9–3.3)
LYMPH%: 14.3 % (ref 14.0–48.0)
MCH: 29.7 pg (ref 26.0–34.0)
MCHC: 31.3 g/dL — AB (ref 32.0–36.0)
MCV: 95 fL (ref 81–101)
MONO#: 0.5 10*3/uL (ref 0.1–0.9)
MONO%: 7.5 % (ref 0.0–13.0)
NEUT#: 5.1 10*3/uL (ref 1.5–6.5)
NEUT%: 77 % (ref 39.6–80.0)
Platelets: 300 10*3/uL (ref 145–400)
RBC: 3.37 10*6/uL — ABNORMAL LOW (ref 3.70–5.32)
RDW: 16.4 % — AB (ref 11.1–15.7)
WBC: 6.6 10*3/uL (ref 3.9–10.0)

## 2016-11-13 LAB — FERRITIN

## 2016-11-13 LAB — LACTATE DEHYDROGENASE: LDH: 220 U/L (ref 125–245)

## 2016-11-13 LAB — TSH: TSH: 4.539 m[IU]/L — AB (ref 0.308–3.960)

## 2016-11-13 MED ORDER — SODIUM CHLORIDE 0.9 % IV SOLN
Freq: Once | INTRAVENOUS | Status: AC
  Administered 2016-11-13: 10:00:00 via INTRAVENOUS

## 2016-11-13 MED ORDER — SODIUM CHLORIDE 0.9% FLUSH
10.0000 mL | INTRAVENOUS | Status: DC | PRN
  Administered 2016-11-13: 10 mL
  Filled 2016-11-13: qty 10

## 2016-11-13 MED ORDER — SODIUM CHLORIDE 0.9 % IV SOLN
240.0000 mg | Freq: Once | INTRAVENOUS | Status: AC
  Administered 2016-11-13: 240 mg via INTRAVENOUS
  Filled 2016-11-13: qty 20

## 2016-11-13 MED ORDER — HEPARIN SOD (PORK) LOCK FLUSH 100 UNIT/ML IV SOLN
500.0000 [IU] | Freq: Once | INTRAVENOUS | Status: AC | PRN
Start: 2016-11-13 — End: 2016-11-13
  Administered 2016-11-13: 500 [IU]
  Filled 2016-11-13: qty 5

## 2016-11-13 NOTE — Patient Instructions (Signed)
Nivolumab injection What is this medicine? NIVOLUMAB (nye VOL ue mab) is a monoclonal antibody. It is used to treat melanoma, lung cancer, kidney cancer, head and neck cancer, Hodgkin lymphoma, and urothelial cancer. COMMON BRAND NAME(S): Opdivo What should I tell my health care provider before I take this medicine? They need to know if you have any of these conditions: -diabetes -immune system problems -kidney disease -liver disease -lung disease -organ transplant -stomach or intestine problems -thyroid disease -an unusual or allergic reaction to nivolumab, other medicines, foods, dyes, or preservatives -pregnant or trying to get pregnant -breast-feeding How should I use this medicine? This medicine is for infusion into a vein. It is given by a health care professional in a hospital or clinic setting. A special MedGuide will be given to you before each treatment. Be sure to read this information carefully each time. Talk to your pediatrician regarding the use of this medicine in children. Special care may be needed. What if I miss a dose? It is important not to miss your dose. Call your doctor or health care professional if you are unable to keep an appointment. What may interact with this medicine? Interactions have not been studied. Give your health care provider a list of all the medicines, herbs, non-prescription drugs, or dietary supplements you use. Also tell them if you smoke, drink alcohol, or use illegal drugs. Some items may interact with your medicine. What should I watch for while using this medicine? This drug may make you feel generally unwell. Continue your course of treatment even though you feel ill unless your doctor tells you to stop. You may need blood work done while you are taking this medicine. Do not become pregnant while taking this medicine or for 5 months after stopping it. Women should inform their doctor if they wish to become pregnant or think they might be  pregnant. There is a potential for serious side effects to an unborn child. Talk to your health care professional or pharmacist for more information. Do not breast-feed an infant while taking this medicine. What side effects may I notice from receiving this medicine? Side effects that you should report to your doctor or health care professional as soon as possible: -allergic reactions like skin rash, itching or hives, swelling of the face, lips, or tongue -black, tarry stools -blood in the urine -bloody or watery diarrhea -changes in vision -change in sex drive -changes in emotions or moods -chest pain -confusion -cough -decreased appetite -diarrhea -facial flushing -feeling faint or lightheaded -fever, chills -hair loss -hallucination, loss of contact with reality -headache -irritable -joint pain -loss of memory -muscle pain -muscle weakness -seizures -shortness of breath -signs and symptoms of high blood sugar such as dizziness; dry mouth; dry skin; fruity breath; nausea; stomach pain; increased hunger or thirst; increased urination -signs and symptoms of kidney injury like trouble passing urine or change in the amount of urine -signs and symptoms of liver injury like dark yellow or Noviello urine; general ill feeling or flu-like symptoms; light-colored stools; loss of appetite; nausea; right upper belly pain; unusually weak or tired; yellowing of the eyes or skin -stiff neck -swelling of the ankles, feet, hands -weight gain Side effects that usually do not require medical attention (report to your doctor or health care professional if they continue or are bothersome): -bone pain -constipation -tiredness -vomiting Where should I keep my medicine? This drug is given in a hospital or clinic and will not be stored at home.  2017 Elsevier/Gold   Standard (2015-12-23 09:04:36)  

## 2016-11-13 NOTE — Progress Notes (Signed)
Hematology and Oncology Follow Up Visit  Brooke Rios [id number] 1925/10/23 82 y.o. 11/13/2016   Principle Diagnosis:   Metastatic  RIGHT renal cell carcinoma-progressive  Paroxysmal atrial fibrillation  Current Therapy:    Votrient 400 mg by mouth daily  Nivolumab 21m IV q 2 week - start 10/30/2016 - s/p c#1  Status post stereotactic radiosurgery for solitary brain met  ELIQUIS 2.5 mg by mouth twice a day     Interim History:  Brooke Rios back for follow-up. She looks a lot better. The ecchymoses on her face as nearly resolved  She had a very nice Christmas. She ate well. She'll be having her birthday tomorrow. She will be going out for her birthday.\  She has little bit of a cough. This is a dry cough. There is no shortness of breath. This might be from the Nivolumab.  She's had no bleeding. She is on ELIQUIS.  There's been no nausea or vomiting. She's had constipation. There's been no diarrhea.  She's had no leg swelling. She's had no headache.  Overall, I sent her performance status is ECOG 1.  Medications:  Current Outpatient Prescriptions:  .  acetaminophen (TYLENOL) 500 MG tablet, Take 500 mg by mouth every 6 (six) hours as needed., Disp: , Rfl:  .  apixaban (ELIQUIS) 2.5 MG TABS tablet, Take 1 tablet (2.5 mg total) by mouth 2 (two) times daily., Disp: 180 tablet, Rfl: 3 .  Cyanocobalamin (VITAMIN B-12 SL), Place 1 tablet under the tongue daily., Disp: , Rfl:  .  docusate sodium (COLACE) 100 MG capsule, Take 100 mg by mouth 2 (two) times daily., Disp: , Rfl:  .  isosorbide mononitrate (IMDUR) 30 MG 24 hr tablet, TAKE 1 TABLET BY MOUTH TWICE A DAY, Disp: 90 tablet, Rfl: 0 .  lidocaine-prilocaine (EMLA) cream, Apply 1 application topically as needed., Disp: 30 g, Rfl: 3 .  metoprolol succinate (TOPROL-XL) 50 MG 24 hr tablet, Take 1 tablet (50 mg total) by mouth every morning. Take with or immediately following a meal. (Patient taking differently: Take 50 mg by  mouth daily. Take with or immediately following a meal.), Disp: 90 tablet, Rfl: 3 .  nitroGLYCERIN (NITROSTAT) 0.4 MG SL tablet, Place 1 tablet (0.4 mg total) under the tongue every 5 (five) minutes as needed for chest pain (MAX 3 TABLETS)., Disp: 25 tablet, Rfl: 1 .  polyethylene glycol (MIRALAX / GLYCOLAX) packet, Take 17 g by mouth daily as needed (constipation). Mix in 8 oz liquid and drink , Disp: , Rfl:  .  polyvinyl alcohol (LIQUIFILM TEARS) 1.4 % ophthalmic solution, Place 1 drop into both eyes every hour as needed for dry eyes., Disp: 15 mL, Rfl: 0 .  traMADol (ULTRAM) 50 MG tablet, Take 1 tablet (50 mg total) by mouth every 6 (six) hours as needed., Disp: 90 tablet, Rfl: 1  Allergies:  Allergies  Allergen Reactions  . Cefpodoxime Other (See Comments)    Nocturia & disturbed sleep  . Codeine     nausea    Past Medical History, Surgical history, Social history, and Family History were reviewed and updated.  Review of Systems: Has above  Physical Exam:  weight is 177 lb (80.3 kg). Her oral temperature is 97.7 F (36.5 C). Her blood pressure is 141/63 (abnormal) and her pulse is 69. Her respiration is 18.   Wt Readings from Last 3 Encounters:  11/13/16 177 lb (80.3 kg)  10/30/16 176 lb (79.8 kg)  10/12/16 179 lb (81.2 kg)  Elderly white female. She has extensive ecchymoses on her face. She has some ptosis of the right eye. She has a little bit of hemorrhage of the right conjunctiva. There is no adenopathy in the neck. Her thyroid is nonpalpable. Lungs are clear bilaterally. Cardiac exam regular rate and rhythm with an occasional extra beat. There is no murmurs, rubs or bruits. Abdomen is soft. She has good bowel sounds. There is no fluid wave. There is no palpable liver or spleen tip. Back exam shows no tenderness over the spine, ribs or hips. Extremities shows some trace edema in her lower extremities. Skin exam shows the extensive facial ecchymoses. Neurological exam shows  no focal neurological deficits.  Lab Results  Component Value Date   WBC 6.6 11/13/2016   HGB 10.0 (L) 11/13/2016   HCT 31.9 (L) 11/13/2016   MCV 95 11/13/2016   PLT 300 11/13/2016     Chemistry      Component Value Date/Time   NA 138 11/13/2016 0850   NA 137 08/08/2016 0948   K 4.1 11/13/2016 0850   K 4.2 08/08/2016 0948   CL 103 11/13/2016 0850   CO2 28 11/13/2016 0850   CO2 23 08/08/2016 0948   BUN 25 (H) 11/13/2016 0850   BUN 40.4 (H) 08/08/2016 0948   CREATININE 1.5 (H) 11/13/2016 0850   CREATININE 1.4 (H) 08/08/2016 0948      Component Value Date/Time   CALCIUM 10.4 (H) 11/13/2016 0850   CALCIUM 9.6 08/08/2016 0948   ALKPHOS 95 (H) 11/13/2016 0850   ALKPHOS 99 08/08/2016 0948   AST 22 11/13/2016 0850   AST 11 08/08/2016 0948   ALT 17 11/13/2016 0850   ALT 26 08/08/2016 0948   BILITOT 0.70 11/13/2016 0850   BILITOT 0.52 08/08/2016 0948         Impression and Plan: Brooke Rios is  an 82 year old white female. She has metastatic kidney cancer. This is clear-cell carcinoma. She presented with a solitary CNS met. This was treated with stereotactic radiosurgery. She has lung metastasis. She has the right kidney as a primary. On her recent CT scan looked like she had a left adrenal met.  I'm glad that she is feeling better. She looks better.  We will go ahead and treat with cycle #2 of Nivolumab today.  Again, I will give her 4 cycle of treatment and then repeat her scans.   We just want her to have a decent quality of life.   Volanda Napoleon, MD 12/26/20179:32 AM

## 2016-11-14 ENCOUNTER — Ambulatory Visit: Payer: Medicare Other | Admitting: Hematology & Oncology

## 2016-11-14 ENCOUNTER — Other Ambulatory Visit: Payer: Medicare Other

## 2016-11-14 ENCOUNTER — Ambulatory Visit: Payer: Medicare Other

## 2016-11-16 ENCOUNTER — Encounter: Payer: Self-pay | Admitting: Cardiology

## 2016-11-16 ENCOUNTER — Ambulatory Visit (INDEPENDENT_AMBULATORY_CARE_PROVIDER_SITE_OTHER): Payer: Medicare Other | Admitting: Cardiology

## 2016-11-16 ENCOUNTER — Telehealth: Payer: Self-pay | Admitting: *Deleted

## 2016-11-16 ENCOUNTER — Ambulatory Visit: Payer: Medicare Other | Admitting: Cardiology

## 2016-11-16 ENCOUNTER — Other Ambulatory Visit: Payer: Self-pay | Admitting: *Deleted

## 2016-11-16 VITALS — BP 130/60 | HR 88 | Ht 66.0 in | Wt 178.0 lb

## 2016-11-16 DIAGNOSIS — I480 Paroxysmal atrial fibrillation: Secondary | ICD-10-CM

## 2016-11-16 DIAGNOSIS — Z950 Presence of cardiac pacemaker: Secondary | ICD-10-CM | POA: Diagnosis not present

## 2016-11-16 DIAGNOSIS — I2583 Coronary atherosclerosis due to lipid rich plaque: Secondary | ICD-10-CM

## 2016-11-16 DIAGNOSIS — I2510 Atherosclerotic heart disease of native coronary artery without angina pectoris: Secondary | ICD-10-CM | POA: Diagnosis not present

## 2016-11-16 DIAGNOSIS — Z862 Personal history of diseases of the blood and blood-forming organs and certain disorders involving the immune mechanism: Secondary | ICD-10-CM

## 2016-11-16 DIAGNOSIS — I10 Essential (primary) hypertension: Secondary | ICD-10-CM | POA: Diagnosis not present

## 2016-11-16 NOTE — Patient Instructions (Addendum)
Continue your current therapy  I will see you in 6 months.   

## 2016-11-16 NOTE — Telephone Encounter (Addendum)
Message left on daughter's voice mail. Message sent to scheduler.   ----- Message from Volanda Napoleon, MD sent at 11/14/2016  6:35 AM EST ----- Call her dgtr - the iron level is low again.  Need 1 dose of Feraheme in next 1-2 weeks.  Please set up for me!!  Thanks1!  pete

## 2016-11-22 DIAGNOSIS — S0101XS Laceration without foreign body of scalp, sequela: Secondary | ICD-10-CM | POA: Diagnosis not present

## 2016-11-26 ENCOUNTER — Other Ambulatory Visit: Payer: Self-pay | Admitting: *Deleted

## 2016-11-26 DIAGNOSIS — C649 Malignant neoplasm of unspecified kidney, except renal pelvis: Secondary | ICD-10-CM

## 2016-11-26 DIAGNOSIS — C7801 Secondary malignant neoplasm of right lung: Principal | ICD-10-CM

## 2016-11-27 ENCOUNTER — Other Ambulatory Visit (HOSPITAL_BASED_OUTPATIENT_CLINIC_OR_DEPARTMENT_OTHER): Payer: Medicare Other

## 2016-11-27 ENCOUNTER — Ambulatory Visit (HOSPITAL_BASED_OUTPATIENT_CLINIC_OR_DEPARTMENT_OTHER): Payer: Medicare Other

## 2016-11-27 ENCOUNTER — Ambulatory Visit: Payer: Medicare Other

## 2016-11-27 VITALS — BP 119/53 | HR 61 | Temp 98.2°F | Resp 17

## 2016-11-27 DIAGNOSIS — C7801 Secondary malignant neoplasm of right lung: Principal | ICD-10-CM

## 2016-11-27 DIAGNOSIS — C641 Malignant neoplasm of right kidney, except renal pelvis: Secondary | ICD-10-CM | POA: Diagnosis not present

## 2016-11-27 DIAGNOSIS — E611 Iron deficiency: Secondary | ICD-10-CM | POA: Diagnosis not present

## 2016-11-27 DIAGNOSIS — C649 Malignant neoplasm of unspecified kidney, except renal pelvis: Secondary | ICD-10-CM

## 2016-11-27 DIAGNOSIS — Z5112 Encounter for antineoplastic immunotherapy: Secondary | ICD-10-CM

## 2016-11-27 DIAGNOSIS — Z862 Personal history of diseases of the blood and blood-forming organs and certain disorders involving the immune mechanism: Secondary | ICD-10-CM

## 2016-11-27 DIAGNOSIS — C7800 Secondary malignant neoplasm of unspecified lung: Secondary | ICD-10-CM

## 2016-11-27 LAB — CBC WITH DIFFERENTIAL (CANCER CENTER ONLY)
BASO#: 0 10*3/uL (ref 0.0–0.2)
BASO%: 0.3 % (ref 0.0–2.0)
EOS ABS: 0.1 10*3/uL (ref 0.0–0.5)
EOS%: 1.7 % (ref 0.0–7.0)
HEMATOCRIT: 31.1 % — AB (ref 34.8–46.6)
HEMOGLOBIN: 9.7 g/dL — AB (ref 11.6–15.9)
LYMPH#: 0.8 10*3/uL — AB (ref 0.9–3.3)
LYMPH%: 14.5 % (ref 14.0–48.0)
MCH: 28.8 pg (ref 26.0–34.0)
MCHC: 31.2 g/dL — AB (ref 32.0–36.0)
MCV: 92 fL (ref 81–101)
MONO#: 0.5 10*3/uL (ref 0.1–0.9)
MONO%: 8.2 % (ref 0.0–13.0)
NEUT%: 75.3 % (ref 39.6–80.0)
NEUTROS ABS: 4.3 10*3/uL (ref 1.5–6.5)
Platelets: 272 10*3/uL (ref 145–400)
RBC: 3.37 10*6/uL — ABNORMAL LOW (ref 3.70–5.32)
RDW: 16 % — ABNORMAL HIGH (ref 11.1–15.7)
WBC: 5.7 10*3/uL (ref 3.9–10.0)

## 2016-11-27 LAB — CMP (CANCER CENTER ONLY)
ALBUMIN: 2.8 g/dL — AB (ref 3.3–5.5)
ALK PHOS: 99 U/L — AB (ref 26–84)
ALT: 18 U/L (ref 10–47)
AST: 17 U/L (ref 11–38)
BUN: 23 mg/dL — AB (ref 7–22)
CHLORIDE: 101 meq/L (ref 98–108)
CO2: 26 mEq/L (ref 18–33)
CREATININE: 1.3 mg/dL — AB (ref 0.6–1.2)
Calcium: 10.9 mg/dL — ABNORMAL HIGH (ref 8.0–10.3)
Glucose, Bld: 125 mg/dL — ABNORMAL HIGH (ref 73–118)
POTASSIUM: 3.6 meq/L (ref 3.3–4.7)
Sodium: 134 mEq/L (ref 128–145)
TOTAL PROTEIN: 6.4 g/dL (ref 6.4–8.1)
Total Bilirubin: 0.6 mg/dl (ref 0.20–1.60)

## 2016-11-27 LAB — LACTATE DEHYDROGENASE: LDH: 173 U/L (ref 125–245)

## 2016-11-27 MED ORDER — SODIUM CHLORIDE 0.9% FLUSH
10.0000 mL | INTRAVENOUS | Status: DC | PRN
  Administered 2016-11-27: 10 mL
  Filled 2016-11-27: qty 10

## 2016-11-27 MED ORDER — SODIUM CHLORIDE 0.9 % IV SOLN
510.0000 mg | Freq: Once | INTRAVENOUS | Status: AC
  Administered 2016-11-27: 510 mg via INTRAVENOUS
  Filled 2016-11-27: qty 17

## 2016-11-27 MED ORDER — SODIUM CHLORIDE 0.9 % IV SOLN
240.0000 mg | Freq: Once | INTRAVENOUS | Status: AC
  Administered 2016-11-27: 240 mg via INTRAVENOUS
  Filled 2016-11-27: qty 20

## 2016-11-27 MED ORDER — SODIUM CHLORIDE 0.9 % IV SOLN
Freq: Once | INTRAVENOUS | Status: AC
  Administered 2016-11-27: 11:00:00 via INTRAVENOUS

## 2016-11-27 MED ORDER — HEPARIN SOD (PORK) LOCK FLUSH 100 UNIT/ML IV SOLN
500.0000 [IU] | Freq: Once | INTRAVENOUS | Status: AC | PRN
  Administered 2016-11-27: 500 [IU]
  Filled 2016-11-27: qty 5

## 2016-11-27 NOTE — Patient Instructions (Signed)

## 2016-11-27 NOTE — Patient Instructions (Signed)
[facility] Instructions for Patients Receiving Chemotherapy  Today you received the following chemotherapy agents Nivolumab and Feraheme.  To help prevent nausea and vomiting after your treatment, we encourage you to take your nausea medication as prescribed.   If you develop nausea and vomiting that is not controlled by your nausea medication, call the clinic.   BELOW ARE SYMPTOMS THAT SHOULD BE REPORTED IMMEDIATELY:  *FEVER GREATER THAN 100.5 F  *CHILLS WITH OR WITHOUT FEVER  NAUSEA AND VOMITING THAT IS NOT CONTROLLED WITH YOUR NAUSEA MEDICATION  *UNUSUAL SHORTNESS OF BREATH  *UNUSUAL BRUISING OR BLEEDING  TENDERNESS IN MOUTH AND THROAT WITH OR WITHOUT PRESENCE OF ULCERS  *URINARY PROBLEMS  *BOWEL PROBLEMS  UNUSUAL RASH Items with * indicate a potential emergency and should be followed up as soon as possible.  Feel free to call the clinic you have any questions or concerns. The clinic phone number is [phone].  Please show the Center at check-in to the Emergency Department and triage nurse.

## 2016-11-27 NOTE — Addendum Note (Signed)
Addended by: Burney Gauze R on: 11/27/2016 11:18 AM   Modules accepted: Orders

## 2016-11-30 ENCOUNTER — Encounter: Payer: Self-pay | Admitting: Hematology & Oncology

## 2016-12-02 ENCOUNTER — Encounter: Payer: Self-pay | Admitting: Hematology & Oncology

## 2016-12-04 ENCOUNTER — Other Ambulatory Visit: Payer: Self-pay

## 2016-12-04 ENCOUNTER — Other Ambulatory Visit: Payer: Self-pay | Admitting: Family

## 2016-12-04 ENCOUNTER — Ambulatory Visit (HOSPITAL_BASED_OUTPATIENT_CLINIC_OR_DEPARTMENT_OTHER): Payer: Medicare Other

## 2016-12-04 ENCOUNTER — Encounter: Payer: Self-pay | Admitting: Hematology & Oncology

## 2016-12-04 ENCOUNTER — Other Ambulatory Visit: Payer: Self-pay | Admitting: Hematology & Oncology

## 2016-12-04 ENCOUNTER — Ambulatory Visit (HOSPITAL_BASED_OUTPATIENT_CLINIC_OR_DEPARTMENT_OTHER): Payer: Medicare Other | Admitting: Hematology & Oncology

## 2016-12-04 VITALS — BP 145/51 | HR 101 | Temp 97.6°F | Resp 20 | Wt 179.0 lb

## 2016-12-04 DIAGNOSIS — C7800 Secondary malignant neoplasm of unspecified lung: Principal | ICD-10-CM

## 2016-12-04 DIAGNOSIS — C649 Malignant neoplasm of unspecified kidney, except renal pelvis: Secondary | ICD-10-CM

## 2016-12-04 DIAGNOSIS — C641 Malignant neoplasm of right kidney, except renal pelvis: Secondary | ICD-10-CM

## 2016-12-04 DIAGNOSIS — Z79899 Other long term (current) drug therapy: Secondary | ICD-10-CM

## 2016-12-04 DIAGNOSIS — R5383 Other fatigue: Secondary | ICD-10-CM | POA: Diagnosis not present

## 2016-12-04 DIAGNOSIS — R5381 Other malaise: Secondary | ICD-10-CM

## 2016-12-04 DIAGNOSIS — N390 Urinary tract infection, site not specified: Secondary | ICD-10-CM

## 2016-12-04 DIAGNOSIS — I480 Paroxysmal atrial fibrillation: Secondary | ICD-10-CM | POA: Diagnosis not present

## 2016-12-04 DIAGNOSIS — R319 Hematuria, unspecified: Principal | ICD-10-CM

## 2016-12-04 DIAGNOSIS — N3001 Acute cystitis with hematuria: Secondary | ICD-10-CM

## 2016-12-04 DIAGNOSIS — C7931 Secondary malignant neoplasm of brain: Secondary | ICD-10-CM

## 2016-12-04 DIAGNOSIS — E860 Dehydration: Secondary | ICD-10-CM

## 2016-12-04 LAB — CBC WITH DIFFERENTIAL (CANCER CENTER ONLY)
BASO#: 0 10*3/uL (ref 0.0–0.2)
BASO%: 0.3 % (ref 0.0–2.0)
EOS ABS: 0.1 10*3/uL (ref 0.0–0.5)
EOS%: 1.3 % (ref 0.0–7.0)
HEMATOCRIT: 33.9 % — AB (ref 34.8–46.6)
HEMOGLOBIN: 10.5 g/dL — AB (ref 11.6–15.9)
LYMPH#: 0.8 10*3/uL — AB (ref 0.9–3.3)
LYMPH%: 12.9 % — ABNORMAL LOW (ref 14.0–48.0)
MCH: 28.6 pg (ref 26.0–34.0)
MCHC: 31 g/dL — AB (ref 32.0–36.0)
MCV: 92 fL (ref 81–101)
MONO#: 0.4 10*3/uL (ref 0.1–0.9)
MONO%: 6 % (ref 0.0–13.0)
NEUT%: 79.5 % (ref 39.6–80.0)
NEUTROS ABS: 4.8 10*3/uL (ref 1.5–6.5)
Platelets: 299 10*3/uL (ref 145–400)
RBC: 3.67 10*6/uL — AB (ref 3.70–5.32)
RDW: 16.3 % — ABNORMAL HIGH (ref 11.1–15.7)
WBC: 6.1 10*3/uL (ref 3.9–10.0)

## 2016-12-04 LAB — CMP (CANCER CENTER ONLY)
ALBUMIN: 3 g/dL — AB (ref 3.3–5.5)
ALT(SGPT): 23 U/L (ref 10–47)
AST: 17 U/L (ref 11–38)
Alkaline Phosphatase: 103 U/L — ABNORMAL HIGH (ref 26–84)
BUN, Bld: 20 mg/dL (ref 7–22)
CALCIUM: 10.8 mg/dL — AB (ref 8.0–10.3)
CHLORIDE: 101 meq/L (ref 98–108)
CO2: 27 meq/L (ref 18–33)
Creat: 1.1 mg/dl (ref 0.6–1.2)
Glucose, Bld: 159 mg/dL — ABNORMAL HIGH (ref 73–118)
Potassium: 3.7 mEq/L (ref 3.3–4.7)
Sodium: 135 mEq/L (ref 128–145)
TOTAL PROTEIN: 6.5 g/dL (ref 6.4–8.1)
Total Bilirubin: 0.6 mg/dl (ref 0.20–1.60)

## 2016-12-04 LAB — URINALYSIS, MICROSCOPIC (CHCC SATELLITE)
Bilirubin (Urine): NEGATIVE
GLUCOSE UR: NEGATIVE mg/dL
Ketones: NEGATIVE mg/dL
Nitrite: POSITIVE
PH: 6 (ref 4.60–8.00)
PROTEIN: NEGATIVE mg/dL
SPECIFIC GRAVITY, URINE: 1.02 (ref 1.003–1.035)
UROBILINOGEN UR: 0.2 mg/dL (ref 0.2–1)

## 2016-12-04 LAB — TSH: TSH: 3.531 m[IU]/L (ref 0.308–3.960)

## 2016-12-04 MED ORDER — ZOLEDRONIC ACID 4 MG/100ML IV SOLN
4.0000 mg | Freq: Once | INTRAVENOUS | Status: AC
  Administered 2016-12-04: 4 mg via INTRAVENOUS
  Filled 2016-12-04: qty 100

## 2016-12-04 MED ORDER — LEVOFLOXACIN IN D5W 500 MG/100ML IV SOLN
500.0000 mg | Freq: Once | INTRAVENOUS | Status: AC
  Administered 2016-12-04: 500 mg via INTRAVENOUS
  Filled 2016-12-04: qty 100

## 2016-12-04 MED ORDER — SODIUM CHLORIDE 0.9 % IV SOLN
Freq: Once | INTRAVENOUS | Status: AC
  Administered 2016-12-04: 14:00:00 via INTRAVENOUS

## 2016-12-04 MED ORDER — SODIUM CHLORIDE 0.9% FLUSH
10.0000 mL | INTRAVENOUS | Status: DC | PRN
  Administered 2016-12-04: 10 mL via INTRAVENOUS
  Filled 2016-12-04: qty 10

## 2016-12-04 MED ORDER — HEPARIN SOD (PORK) LOCK FLUSH 100 UNIT/ML IV SOLN
500.0000 [IU] | Freq: Once | INTRAVENOUS | Status: AC
  Administered 2016-12-04: 500 [IU] via INTRAVENOUS
  Filled 2016-12-04: qty 5

## 2016-12-04 MED ORDER — LEVOFLOXACIN 500 MG PO TABS: 500.0000 mg | ORAL_TABLET | Freq: Every day | ORAL | 0 refills | Status: DC

## 2016-12-04 NOTE — Patient Instructions (Signed)
Levofloxacin injection What is this medicine? LEVOFLOXACIN (lee voe FLOX a sin) is a quinolone antibiotic. It is used to treat certain kinds of bacterial infections. It will not work for colds, flu, or other viral infections. This medicine may be used for other purposes; ask your health care provider or pharmacist if you have questions. COMMON BRAND NAME(S): Levaquin What should I tell my health care provider before I take this medicine? They need to know if you have any of these conditions: -bone problems -diabetes -history of low levels of potassium in the blood -irregular heartbeat -joint problems -kidney disease -liver disease -myasthenia gravis -seizures -tendon problems -tingling of the fingers or toes, or other nerve disorder -an unusual or allergic reaction to levofloxacin, other quinolone antibiotics, foods, dyes, or preservatives -pregnant or trying to get pregnant -breast-feeding How should I use this medicine? This medicine is for infusion into a vein. It is usually given by a health care professional in a hospital or clinic setting. If you get this medicine at home, you will be taught how to prepare and give this medicine. Use exactly as directed. Take your medicine at regular intervals. Do not take your medicine more often than directed. It is important that you put your used needles and syringes in a special sharps container. Do not put them in a trash can. If you do not have a sharps container, call your pharmacist or healthcare provider to get one. A special MedGuide will be given to you by the pharmacist with each prescription and refill. Be sure to read this information carefully each time. Talk to your pediatrician regarding the use of this medicine in children. While this drug may be prescribed for children as young as 6 months for selected conditions, precautions do apply. Overdosage: If you think you have taken too much of this medicine contact a poison control  center or emergency room at once. NOTE: This medicine is only for you. Do not share this medicine with others. What if I miss a dose? If you miss a dose, use it as soon as you can. If it is almost time for your next dose, use only that dose. Do not use double or extra doses. What may interact with this medicine? Do not take this medicine with any of the following medications: -bepridil -certain medicines for depression, anxiety, or psychotic disturbances like pimozide, thioridazine, and ziprasidone -certain medicines for irregular heart beat like dofetilide and dronedarone -cisapride -halofantrine This medicine may also interact with the following medications: -birth control pills -certain medicines for diabetes, like glipizide, glyburide, or insulin -NSAIDS, medicines for pain and inflammation, like ibuprofen or naproxen -steroid medicines like prednisone or cortisone -theophylline -warfarin This list may not describe all possible interactions. Give your health care provider a list of all the medicines, herbs, non-prescription drugs, or dietary supplements you use. Also tell them if you smoke, drink alcohol, or use illegal drugs. Some items may interact with your medicine. What should I watch for while using this medicine? Tell your doctor or healthcare professional if your symptoms do not start to get better or if they get worse. Do not treat diarrhea with over the counter products. Contact your doctor if you have diarrhea that lasts more than 2 days or if it is severe and watery. Check with your doctor or health care professional if you get an attack of severe diarrhea, nausea and vomiting, or if you sweat a lot. The loss of too much body fluid can make it dangerous  for you to take this medicine. You may get drowsy or dizzy. Do not drive, use machinery, or do anything that needs mental alertness until you know how this medicine affects you. Do not sit or stand up quickly, especially if you  are an older patient. This reduces the risk of dizzy or fainting spells. This medicine can make you more sensitive to the sun. Keep out of the sun. If you cannot avoid being in the sun, wear protective clothing and use a sunscreen. Do not use sun lamps or tanning beds/booths. Contact your doctor if you get a sunburn. If you are a diabetic monitor your blood glucose carefully. If you get an unusual reading stop taking this medicine and call your doctor right away. What side effects may I notice from receiving this medicine? Side effects that you should report to your doctor or health care professional as soon as possible: -allergic reactions like skin rash or hives, swelling of the face, lips, or tongue -anxious -breathing problems -confusion -depressed mood -diarrhea -dizziness -fast, irregular heartbeat -hallucination, loss of contact with reality -joint, muscle, or tendon pain or swelling -muscle weakness -pain, tingling, numbness in the hands or feet -seizures -signs and symptoms of high blood sugar such as dizziness; dry mouth; dry skin; fruity breath; nausea; stomach pain; increased hunger or thirst; increased urination -signs and symptoms of liver injury like dark yellow or Holck urine; general ill feeling or flu-like symptoms; light-colored stools; loss of appetite; nausea; right upper belly pain; unusually weak or tired; yellowing of the eyes or skin -signs and symptoms of low blood sugar such as feeling anxious; confusion; dizziness; increased hunger; unusually weak or tired; sweating; shakiness; cold; irritable; headache; blurred vision; fast heartbeat; loss of consciousness -suicidal thoughts or other mood changes -sunburn -unusually weak or tired Side effects that usually do not require medical attention (report to your doctor or health care professional if they continue or are bothersome): -constipation -dry mouth -headache -nausea, vomiting -pain, irritation at the site of  injection -trouble sleeping This list may not describe all possible side effects. Call your doctor for medical advice about side effects. You may report side effects to FDA at 1-800-FDA-1088. Where should I keep my medicine? Keep out of the reach of children. If you are using this medicine at home, you will be instructed on how to store this medicine. Throw away any unused medicine after the expiration date on the label. NOTE: This sheet is a summary. It may not cover all possible information. If you have questions about this medicine, talk to your doctor, pharmacist, or health care provider.  2017 Elsevier/Gold Standard (2016-05-15 12:36:58) Zoledronic Acid injection (Hypercalcemia, Oncology) What is this medicine? ZOLEDRONIC ACID (ZOE le dron ik AS id) lowers the amount of calcium loss from bone. It is used to treat too much calcium in your blood from cancer. It is also used to prevent complications of cancer that has spread to the bone. This medicine may be used for other purposes; ask your health care provider or pharmacist if you have questions. COMMON BRAND NAME(S): Zometa What should I tell my health care provider before I take this medicine? They need to know if you have any of these conditions: -aspirin-sensitive asthma -cancer, especially if you are receiving medicines used to treat cancer -dental disease or wear dentures -infection -kidney disease -receiving corticosteroids like dexamethasone or prednisone -an unusual or allergic reaction to zoledronic acid, other medicines, foods, dyes, or preservatives -pregnant or trying to get pregnant -  breast-feeding How should I use this medicine? This medicine is for infusion into a vein. It is given by a health care professional in a hospital or clinic setting. Talk to your pediatrician regarding the use of this medicine in children. Special care may be needed. Overdosage: If you think you have taken too much of this medicine contact a  poison control center or emergency room at once. NOTE: This medicine is only for you. Do not share this medicine with others. What if I miss a dose? It is important not to miss your dose. Call your doctor or health care professional if you are unable to keep an appointment. What may interact with this medicine? -certain antibiotics given by injection -NSAIDs, medicines for pain and inflammation, like ibuprofen or naproxen -some diuretics like bumetanide, furosemide -teriparatide -thalidomide This list may not describe all possible interactions. Give your health care provider a list of all the medicines, herbs, non-prescription drugs, or dietary supplements you use. Also tell them if you smoke, drink alcohol, or use illegal drugs. Some items may interact with your medicine. What should I watch for while using this medicine? Visit your doctor or health care professional for regular checkups. It may be some time before you see the benefit from this medicine. Do not stop taking your medicine unless your doctor tells you to. Your doctor may order blood tests or other tests to see how you are doing. Women should inform their doctor if they wish to become pregnant or think they might be pregnant. There is a potential for serious side effects to an unborn child. Talk to your health care professional or pharmacist for more information. You should make sure that you get enough calcium and vitamin D while you are taking this medicine. Discuss the foods you eat and the vitamins you take with your health care professional. Some people who take this medicine have severe bone, joint, and/or muscle pain. This medicine may also increase your risk for jaw problems or a broken thigh bone. Tell your doctor right away if you have severe pain in your jaw, bones, joints, or muscles. Tell your doctor if you have any pain that does not go away or that gets worse. Tell your dentist and dental surgeon that you are taking this  medicine. You should not have major dental surgery while on this medicine. See your dentist to have a dental exam and fix any dental problems before starting this medicine. Take good care of your teeth while on this medicine. Make sure you see your dentist for regular follow-up appointments. What side effects may I notice from receiving this medicine? Side effects that you should report to your doctor or health care professional as soon as possible: -allergic reactions like skin rash, itching or hives, swelling of the face, lips, or tongue -anxiety, confusion, or depression -breathing problems -changes in vision -eye pain -feeling faint or lightheaded, falls -jaw pain, especially after dental work -mouth sores -muscle cramps, stiffness, or weakness -redness, blistering, peeling or loosening of the skin, including inside the mouth -trouble passing urine or change in the amount of urine Side effects that usually do not require medical attention (report to your doctor or health care professional if they continue or are bothersome): -bone, joint, or muscle pain -constipation -diarrhea -fever -hair loss -irritation at site where injected -loss of appetite -nausea, vomiting -stomach upset -trouble sleeping -trouble swallowing -weak or tired This list may not describe all possible side effects. Call your doctor for medical  advice about side effects. You may report side effects to FDA at 1-800-FDA-1088. Where should I keep my medicine? This drug is given in a hospital or clinic and will not be stored at home. NOTE: This sheet is a summary. It may not cover all possible information. If you have questions about this medicine, talk to your doctor, pharmacist, or health care provider.  2017 Elsevier/Gold Standard (2014-04-03 14:19:39) Hypercalcemia Introduction Hypercalcemia is having too much calcium in the blood. The body needs calcium to make bones and keep them strong. Calcium also helps  the muscles, nerves, brain, and heart work the way they should. Most of the calcium in the body is in the bones. There is also some calcium in the blood. Hypercalcemia can happen when calcium comes out of the bones, or when the kidneys are not able to remove calcium from the blood. Hypercalcemia can be mild or severe. What are the causes? There are many possible causes of hypercalcemia. Common causes include:  Hyperparathyroidism. This is a condition in which the body produces too much parathyroid hormone. There are four parathyroid glands in your neck. These glands produce a chemical messenger (hormone) that helps the body absorb calcium from foods and helps your bones release calcium.  Certain kinds of cancer, such as lung cancer, breast cancer, or myeloma. Less common causes of hypercalcemia include:  Getting too much calcium or vitamin D from your diet.  Kidney failure.  Hyperthyroidism.  Being on bed rest for a long time.  Certain medicines.  Infections.  Sarcoidosis. What increases the risk? This condition is more likely to develop in:  Women.  People who are 60 years or older.  People who have a family history of hypercalcemia. What are the signs or symptoms? Mild hypercalcemia that starts slowly may not cause symptoms. Severe, sudden hypercalcemia is more likely to cause symptoms, such as:  Loss of appetite.  Increased thirst and frequent urination.  Fatigue.  Nausea and vomiting.  Headache.  Abdominal pain.  Muscle pain, twitching, or weakness.  Constipation.  Blood in the urine.  Pain in the side of the back (flank pain).  Anxiety, confusion, or depression.  Irregular heartbeat (arrhythmia).  Loss of consciousness. How is this diagnosed? This condition may be diagnosed based on:  Your symptoms.  Blood tests.  Urine tests.  X-rays.  Ultrasound.  MRI.  CT scan. How is this treated? Treatment for hypercalcemia depends on the cause.  Treatment may include:  Receiving fluids through an IV tube.  Medicines that keep calcium levels steady after receiving fluids (loop diuretics).  Medicines that keep calcium in your bones (bisphosphonates).  Medicines that lower the calcium level in your blood.  Surgery to remove overactive parathyroid glands. Follow these instructions at home:  Take over-the-counter and prescription medicines only as told by your health care provider.  Follow instructions from your health care provider about eating or drinking restrictions.  Drink enough fluid to keep your urine clear or pale yellow.  Stay active. Weight-bearing exercise helps to keep calcium in your bones. Follow instructions from your health care provider about what type and level of exercise is safe for you.  Keep all follow-up visits as told by your health care provider. This is important. Contact a health care provider if:  You have a fever.  You have flank or abdominal pain that is getting worse. Get help right away if:  You have severe abdominal or flank pain.  You have chest pain.  You have trouble  breathing.  You become very confused and sleepy.  You lose consciousness. This information is not intended to replace advice given to you by your health care provider. Make sure you discuss any questions you have with your health care provider. Document Released: 01/19/2005 Document Revised: 04/12/2016 Document Reviewed: 03/23/2015  2017 Elsevier

## 2016-12-04 NOTE — Progress Notes (Signed)
Hematology and Oncology Follow Up Visit  Brooke Rios [id number] [birth year] 83 y.o. 12/04/2016   Principle Diagnosis:   Metastatic  RIGHT renal cell carcinoma-progressive  Paroxysmal atrial fibrillation  Current Therapy:    Votrient 400 mg by mouth daily  Nivolumab '240mg'$  IV q 2 week - start 10/30/2016 - s/p c#1  Status post stereotactic radiosurgery for solitary brain met  ELIQUIS 2.5 mg by mouth twice a day     Interim History:  Brooke Rios is back for an unscheduled visit. I we have called from one of her daughters yesterday. She was having more difficulties at home. She was not as active. She had a little bit of confusion. She was weaker. She also said that her mother's urine was very dark with an odor.  She has done well, from my point of view, with Nivolumab. I ultimately she has had a problems with this.  However, with immunotherapy, there are be some unusual side effects that we do have to watch out for. This is why want her to come in for lab work.   Thankfully, she has not had any bleeding. She is healed up very nicely from the fall. She had open wounds on her forehead which have healed.  She's had no diarrhea.  She's had no fever. follow-up. She looks a lot better. The ecchymoses on her face as nearly resolved  She had a very nice Christmas. She ate well. She'll be having her birthday tomorrow. She will be going out for her birthday.\  She has little bit of a cough. This is a dry cough. There is no shortness of breath. This might be from the Nivolumab.  She's had no bleeding. She is on ELIQUIS.  There's been no nausea or vomiting. She's had constipation. There's been no diarrhea.  Overall, I sent her performance status is ECOG 2.  Medications:  Current Outpatient Prescriptions:  .  acetaminophen (TYLENOL) 500 MG tablet, Take 500 mg by mouth every 6 (six) hours as needed., Disp: , Rfl:  .  apixaban (ELIQUIS) 2.5 MG TABS tablet, Take 1 tablet (2.5 mg total) by  mouth 2 (two) times daily., Disp: 180 tablet, Rfl: 3 .  Cyanocobalamin (VITAMIN B-12 SL), Place 1 tablet under the tongue daily., Disp: , Rfl:  .  docusate sodium (COLACE) 100 MG capsule, Take 100 mg by mouth 2 (two) times daily., Disp: , Rfl:  .  isosorbide mononitrate (IMDUR) 30 MG 24 hr tablet, TAKE 1 TABLET BY MOUTH TWICE A DAY, Disp: 90 tablet, Rfl: 0 .  levofloxacin (LEVAQUIN) 500 MG tablet, Take 1 tablet (500 mg total) by mouth daily., Disp: 7 tablet, Rfl: 0 .  lidocaine-prilocaine (EMLA) cream, Apply 1 application topically as needed., Disp: 30 g, Rfl: 3 .  metoprolol succinate (TOPROL-XL) 50 MG 24 hr tablet, Take 1 tablet (50 mg total) by mouth every morning. Take with or immediately following a meal. (Patient taking differently: Take 50 mg by mouth daily. Take with or immediately following a meal.), Disp: 90 tablet, Rfl: 3 .  nitroGLYCERIN (NITROSTAT) 0.4 MG SL tablet, Place 1 tablet (0.4 mg total) under the tongue every 5 (five) minutes as needed for chest pain (MAX 3 TABLETS)., Disp: 25 tablet, Rfl: 1 .  polyethylene glycol (MIRALAX / GLYCOLAX) packet, Take 17 g by mouth daily as needed (constipation). Mix in 8 oz liquid and drink , Disp: , Rfl:  .  polyvinyl alcohol (LIQUIFILM TEARS) 1.4 % ophthalmic solution, Place 1 drop into both eyes every  hour as needed for dry eyes., Disp: 15 mL, Rfl: 0 .  traMADol (ULTRAM) 50 MG tablet, Take 1 tablet (50 mg total) by mouth every 6 (six) hours as needed., Disp: 90 tablet, Rfl: 1  Current Facility-Administered Medications:  .  levofloxacin (LEVAQUIN) IVPB 500 mg, 500 mg, Intravenous, Once, Volanda Napoleon, MD .  Zoledronic Acid (ZOMETA) 4 mg IVPB, 4 mg, Intravenous, Once, Volanda Napoleon, MD  Allergies:  Allergies  Allergen Reactions  . Cefpodoxime Other (See Comments)    Nocturia & disturbed sleep  . Codeine     nausea    Past Medical History, Surgical history, Social history, and Family History were reviewed and updated.  Review of  Systems: Has above  Physical Exam:  weight is 179 lb (81.2 kg). Her oral temperature is 97.6 F (36.4 C). Her blood pressure is 145/51 (abnormal) and her pulse is 101 (abnormal). Her respiration is 20 and oxygen saturation is 99%.   Wt Readings from Last 3 Encounters:  12/04/16 179 lb (81.2 kg)  11/16/16 178 lb (80.7 kg)  11/13/16 177 lb (80.3 kg)     Elderly white female. She has extensive ecchymoses on her face. She has some ptosis of the right eye. She has a little bit of hemorrhage of the right conjunctiva. There is no adenopathy in the neck. Her thyroid is nonpalpable. Lungs are clear bilaterally. Cardiac exam regular rate and rhythm with an occasional extra beat. There is no murmurs, rubs or bruits. Abdomen is soft. She has good bowel sounds. There is no fluid wave. There is no palpable liver or spleen tip. Back exam shows no tenderness over the spine, ribs or hips. Extremities shows some trace edema in her lower extremities. Skin exam shows the extensive facial ecchymoses. Neurological exam shows no focal neurological deficits.  Lab Results  Component Value Date   WBC 6.1 12/04/2016   HGB 10.5 (L) 12/04/2016   HCT 33.9 (L) 12/04/2016   MCV 92 12/04/2016   PLT 299 12/04/2016     Chemistry      Component Value Date/Time   NA 135 12/04/2016 1132   NA 137 08/08/2016 0948   K 3.7 12/04/2016 1132   K 4.2 08/08/2016 0948   CL 101 12/04/2016 1132   CO2 27 12/04/2016 1132   CO2 23 08/08/2016 0948   BUN 20 12/04/2016 1132   BUN 40.4 (H) 08/08/2016 0948   CREATININE 1.1 12/04/2016 1132   CREATININE 1.4 (H) 08/08/2016 0948      Component Value Date/Time   CALCIUM 10.8 (H) 12/04/2016 1132   CALCIUM 9.6 08/08/2016 0948   ALKPHOS 103 (H) 12/04/2016 1132   ALKPHOS 99 08/08/2016 0948   AST 17 12/04/2016 1132   AST 11 08/08/2016 0948   ALT 23 12/04/2016 1132   ALT 26 08/08/2016 0948   BILITOT 0.60 12/04/2016 1132   BILITOT 0.52 08/08/2016 0948         Impression and  Plan: Brooke Rios is  an 83 year old white female. She has metastatic kidney cancer. This is clear-cell carcinoma. She presented with a solitary CNS met. This was treated with stereotactic radiosurgery. She has lung metastasis. She has the right kidney as a primary. On her recent CT scan looked like she had a left adrenal met.  Her labs look like she had does have a urinary tract infection. I think that she is not bacteremic. I don't she has pyelonephritis. However, I think we have to be aggressive with this as she  can decline very quickly.  I will go ahead and give her a dose of IV antibiotics with Levaquin. I am this will cover the typical gram-negative rods which I think is what she has in her urine. Hopefully, she has Escherichia coli or Klebsiella.  I will then put her on oral Levaquin. We will give her 500 mg daily for 7 days.  Other she also may be dealing with some hypercalcemia. I will give her IV fluids along with a dose of Zometa. Her renal function is all right.  I want to try to keep her out of the hospital. I the week and treated her in the office and be successful. Again, a lot will depend on the actual bacteria in her urine.  We will keep her regular appointment with [place]. Again, hopefully we can get her feeling better.   I spent about 35 minutes with she and her daughters today.   Volanda Napoleon, MD 1/16/20181:30 PM

## 2016-12-05 ENCOUNTER — Other Ambulatory Visit: Payer: Self-pay | Admitting: *Deleted

## 2016-12-05 DIAGNOSIS — N390 Urinary tract infection, site not specified: Secondary | ICD-10-CM

## 2016-12-05 DIAGNOSIS — R319 Hematuria, unspecified: Principal | ICD-10-CM

## 2016-12-05 MED ORDER — LEVOFLOXACIN 500 MG PO TABS: 500.0000 mg | ORAL_TABLET | Freq: Every day | ORAL | 0 refills | Status: DC

## 2016-12-06 ENCOUNTER — Encounter: Payer: Self-pay | Admitting: Hematology & Oncology

## 2016-12-08 ENCOUNTER — Encounter: Payer: Self-pay | Admitting: Hematology & Oncology

## 2016-12-08 ENCOUNTER — Other Ambulatory Visit: Payer: Self-pay | Admitting: Hematology & Oncology

## 2016-12-08 DIAGNOSIS — N390 Urinary tract infection, site not specified: Secondary | ICD-10-CM

## 2016-12-08 DIAGNOSIS — R319 Hematuria, unspecified: Principal | ICD-10-CM

## 2016-12-09 LAB — URINE CULTURE

## 2016-12-10 ENCOUNTER — Other Ambulatory Visit: Payer: Self-pay | Admitting: Hematology & Oncology

## 2016-12-10 ENCOUNTER — Ambulatory Visit: Payer: Medicare Other

## 2016-12-10 ENCOUNTER — Telehealth: Payer: Self-pay | Admitting: *Deleted

## 2016-12-10 ENCOUNTER — Ambulatory Visit: Payer: Medicare Other | Admitting: Family

## 2016-12-10 DIAGNOSIS — N390 Urinary tract infection, site not specified: Secondary | ICD-10-CM

## 2016-12-10 DIAGNOSIS — R319 Hematuria, unspecified: Principal | ICD-10-CM

## 2016-12-10 NOTE — Telephone Encounter (Signed)
Brooke Rios per the MyChart message received to bring patient in today for assessment. [name] stated she already had an appointment today and would be unable to bring patient in. She wants to keep already scheduled appointment for tomorrow. Appointments for today cancelled and appointment reinstated for tomorrow. Judy aware.

## 2016-12-11 ENCOUNTER — Other Ambulatory Visit: Payer: Medicare Other

## 2016-12-11 ENCOUNTER — Telehealth: Payer: Self-pay | Admitting: *Deleted

## 2016-12-11 ENCOUNTER — Ambulatory Visit: Payer: Medicare Other | Admitting: Hematology & Oncology

## 2016-12-11 ENCOUNTER — Ambulatory Visit (HOSPITAL_BASED_OUTPATIENT_CLINIC_OR_DEPARTMENT_OTHER): Payer: Medicare Other | Admitting: Hematology & Oncology

## 2016-12-11 ENCOUNTER — Ambulatory Visit (HOSPITAL_BASED_OUTPATIENT_CLINIC_OR_DEPARTMENT_OTHER)
Admission: RE | Admit: 2016-12-11 | Discharge: 2016-12-11 | Disposition: A | Discharge status: Home / Self Care | Payer: Medicare Other | Source: Ambulatory Visit | Attending: Hematology & Oncology | Admitting: Hematology & Oncology

## 2016-12-11 ENCOUNTER — Ambulatory Visit: Payer: Medicare Other

## 2016-12-11 ENCOUNTER — Encounter (HOSPITAL_BASED_OUTPATIENT_CLINIC_OR_DEPARTMENT_OTHER): Payer: Self-pay

## 2016-12-11 ENCOUNTER — Encounter: Payer: Self-pay | Admitting: Hematology & Oncology

## 2016-12-11 VITALS — BP 122/56 | HR 64 | Temp 97.7°F | Resp 18

## 2016-12-11 DIAGNOSIS — C7951 Secondary malignant neoplasm of bone: Secondary | ICD-10-CM | POA: Diagnosis not present

## 2016-12-11 DIAGNOSIS — I482 Chronic atrial fibrillation, unspecified: Secondary | ICD-10-CM

## 2016-12-11 DIAGNOSIS — C641 Malignant neoplasm of right kidney, except renal pelvis: Secondary | ICD-10-CM

## 2016-12-11 DIAGNOSIS — C7931 Secondary malignant neoplasm of brain: Secondary | ICD-10-CM

## 2016-12-11 DIAGNOSIS — C7800 Secondary malignant neoplasm of unspecified lung: Secondary | ICD-10-CM

## 2016-12-11 DIAGNOSIS — C7801 Secondary malignant neoplasm of right lung: Principal | ICD-10-CM

## 2016-12-11 DIAGNOSIS — C649 Malignant neoplasm of unspecified kidney, except renal pelvis: Secondary | ICD-10-CM

## 2016-12-11 DIAGNOSIS — R410 Disorientation, unspecified: Secondary | ICD-10-CM

## 2016-12-11 DIAGNOSIS — I480 Paroxysmal atrial fibrillation: Secondary | ICD-10-CM | POA: Diagnosis not present

## 2016-12-11 DIAGNOSIS — K862 Cyst of pancreas: Secondary | ICD-10-CM | POA: Diagnosis not present

## 2016-12-11 DIAGNOSIS — R319 Hematuria, unspecified: Secondary | ICD-10-CM | POA: Diagnosis not present

## 2016-12-11 DIAGNOSIS — E279 Disorder of adrenal gland, unspecified: Secondary | ICD-10-CM | POA: Diagnosis not present

## 2016-12-11 DIAGNOSIS — G936 Cerebral edema: Secondary | ICD-10-CM | POA: Insufficient documentation

## 2016-12-11 DIAGNOSIS — N2889 Other specified disorders of kidney and ureter: Secondary | ICD-10-CM | POA: Insufficient documentation

## 2016-12-11 DIAGNOSIS — I700 Atherosclerosis of aorta: Secondary | ICD-10-CM | POA: Diagnosis not present

## 2016-12-11 LAB — LACTATE DEHYDROGENASE: LDH: 210 U/L (ref 125–245)

## 2016-12-11 LAB — CMP (CANCER CENTER ONLY)
ALT(SGPT): 19 U/L (ref 10–47)
AST: 16 U/L (ref 11–38)
Albumin: 2.8 g/dL — ABNORMAL LOW (ref 3.3–5.5)
Alkaline Phosphatase: 93 U/L — ABNORMAL HIGH (ref 26–84)
BUN: 19 mg/dL (ref 7–22)
CHLORIDE: 100 meq/L (ref 98–108)
CO2: 27 mEq/L (ref 18–33)
Calcium: 8.6 mg/dL (ref 8.0–10.3)
Creat: 1.1 mg/dl (ref 0.6–1.2)
GLUCOSE: 106 mg/dL (ref 73–118)
POTASSIUM: 3.9 meq/L (ref 3.3–4.7)
Sodium: 137 mEq/L (ref 128–145)
Total Bilirubin: 0.6 mg/dl (ref 0.20–1.60)
Total Protein: 6.3 g/dL — ABNORMAL LOW (ref 6.4–8.1)

## 2016-12-11 LAB — URINALYSIS, MICROSCOPIC (CHCC SATELLITE)
BILIRUBIN (URINE): NEGATIVE
Glucose: NEGATIVE mg/dL
KETONES: NEGATIVE mg/dL
Nitrite: NEGATIVE
PH: 6 (ref 4.60–8.00)
Protein: NEGATIVE mg/dL
Specific Gravity, Urine: 1.015 (ref 1.003–1.035)
Urobilinogen, UR: 0.2 mg/dL (ref 0.2–1)

## 2016-12-11 LAB — CBC WITH DIFFERENTIAL (CANCER CENTER ONLY)
BASO#: 0 10*3/uL (ref 0.0–0.2)
BASO%: 0.3 % (ref 0.0–2.0)
EOS ABS: 0.1 10*3/uL (ref 0.0–0.5)
EOS%: 1.7 % (ref 0.0–7.0)
HCT: 32.6 % — ABNORMAL LOW (ref 34.8–46.6)
HGB: 10.3 g/dL — ABNORMAL LOW (ref 11.6–15.9)
LYMPH#: 0.9 10*3/uL (ref 0.9–3.3)
LYMPH%: 14.6 % (ref 14.0–48.0)
MCH: 28.5 pg (ref 26.0–34.0)
MCHC: 31.6 g/dL — ABNORMAL LOW (ref 32.0–36.0)
MCV: 90 fL (ref 81–101)
MONO#: 0.5 10*3/uL (ref 0.1–0.9)
MONO%: 8.2 % (ref 0.0–13.0)
NEUT#: 4.8 10*3/uL (ref 1.5–6.5)
NEUT%: 75.2 % (ref 39.6–80.0)
PLATELETS: 285 10*3/uL (ref 145–400)
RBC: 3.61 10*6/uL — ABNORMAL LOW (ref 3.70–5.32)
RDW: 16.2 % — AB (ref 11.1–15.7)
WBC: 6.4 10*3/uL (ref 3.9–10.0)

## 2016-12-11 MED ORDER — IOPAMIDOL (ISOVUE-300) INJECTION 61%
100.0000 mL | Freq: Once | INTRAVENOUS | Status: AC | PRN
  Administered 2016-12-11: 80 mL via INTRAVENOUS

## 2016-12-11 NOTE — Progress Notes (Signed)
Hematology and Oncology Follow Up Visit  Brooke Rios [id number] August 02, 1925 83 y.o. 12/11/2016   Principle Diagnosis:   Metastatic  RIGHT renal cell carcinoma-progressive  Paroxysmal atrial fibrillation  Current Therapy:    Votrient 400 mg by mouth daily  Nivolumab '240mg'$  IV q 2 week - start 10/30/2016 - s/p c#3  Status post stereotactic radiosurgery for solitary brain met  ELIQUIS 2.5 mg by mouth twice a day     Interim History:  Brooke Rios is back for follow-up. Unfortunately, she just is still not getting better. I thought that the urinary tract infection that she had last week was her likely problem. She grew Proteus in her urine. We gave her Levaquin. This, I think, was effective. We are rechecking her urine today.   She just is having a hard time getting around. She seems to have a very flat affect. She is living with one of her daughters now. She needs help with eating. She will need help with going to the bathroom.   It seems as if she may have had some type of cerebrovascular event. She is 83 years old. As such, she is definitely at risk for cerebrovascular disease. She is on low-dose ELIQUIS which might help with this.   Her performance status is ECOG 3. I just think that we are going to make any headway with treating her metastatic kidney cancer. I just don't believe that is going to be her problem. Even if she had metastatic disease, and it was progressing, I don't see that she would really be a candidate for any therapy.   Our goal from day 1 was quality-of-life. She is declining with her quality of life.   Medications:  Current Outpatient Prescriptions:  .  acetaminophen (TYLENOL) 500 MG tablet, Take 500 mg by mouth every 6 (six) hours as needed., Disp: , Rfl:  .  apixaban (ELIQUIS) 2.5 MG TABS tablet, Take 1 tablet (2.5 mg total) by mouth 2 (two) times daily., Disp: 180 tablet, Rfl: 3 .  Cyanocobalamin (VITAMIN B-12 SL), Place 1 tablet under the tongue daily.,  Disp: , Rfl:  .  docusate sodium (COLACE) 100 MG capsule, Take 100 mg by mouth 2 (two) times daily., Disp: , Rfl:  .  isosorbide mononitrate (IMDUR) 30 MG 24 hr tablet, TAKE 1 TABLET BY MOUTH TWICE A DAY, Disp: 90 tablet, Rfl: 0 .  levofloxacin (LEVAQUIN) 500 MG tablet, TAKE 1 TABLET (500 MG TOTAL) BY MOUTH DAILY., Disp: 7 tablet, Rfl: 0 .  levofloxacin (LEVAQUIN) 500 MG tablet, TAKE 1 TABLET (500 MG TOTAL) BY MOUTH DAILY., Disp: 7 tablet, Rfl: 0 .  lidocaine-prilocaine (EMLA) cream, Apply 1 application topically as needed., Disp: 30 g, Rfl: 3 .  metoprolol succinate (TOPROL-XL) 50 MG 24 hr tablet, Take 1 tablet (50 mg total) by mouth every morning. Take with or immediately following a meal. (Patient taking differently: Take 50 mg by mouth daily. Take with or immediately following a meal.), Disp: 90 tablet, Rfl: 3 .  nitroGLYCERIN (NITROSTAT) 0.4 MG SL tablet, Place 1 tablet (0.4 mg total) under the tongue every 5 (five) minutes as needed for chest pain (MAX 3 TABLETS)., Disp: 25 tablet, Rfl: 1 .  polyethylene glycol (MIRALAX / GLYCOLAX) packet, Take 17 g by mouth daily as needed (constipation). Mix in 8 oz liquid and drink , Disp: , Rfl:  .  polyvinyl alcohol (LIQUIFILM TEARS) 1.4 % ophthalmic solution, Place 1 drop into both eyes every hour as needed for dry eyes., Disp:  15 mL, Rfl: 0 .  traMADol (ULTRAM) 50 MG tablet, Take 1 tablet (50 mg total) by mouth every 6 (six) hours as needed., Disp: 90 tablet, Rfl: 1  Allergies:  Allergies  Allergen Reactions  . Cefpodoxime Other (See Comments)    Nocturia & disturbed sleep  . Codeine     nausea    Past Medical History, Surgical history, Social history, and Family History were reviewed and updated.  Review of Systems: Has above  Physical Exam:  oral temperature is 97.7 F (36.5 C). Her blood pressure is 122/56 (abnormal) and her pulse is 64. Her respiration is 18 and oxygen saturation is 98%.   Wt Readings from Last 3 Encounters:  12/04/16  179 lb (81.2 kg)  11/16/16 178 lb (80.7 kg)  11/13/16 177 lb (80.3 kg)     Elderly white female. She has extensive ecchymoses on her face. She has some ptosis of the right eye. She has a little bit of hemorrhage of the right conjunctiva. There is no adenopathy in the neck. Her thyroid is nonpalpable. Lungs are clear bilaterally. Cardiac exam regular rate and rhythm with an occasional extra beat. There is no murmurs, rubs or bruits. Abdomen is soft. She has good bowel sounds. There is no fluid wave. There is no palpable liver or spleen tip. Back exam shows no tenderness over the spine, ribs or hips. Extremities shows some trace edema in her lower extremities. Skin exam shows the extensive facial ecchymoses. Neurological exam shows no focal neurological deficits.  Lab Results  Component Value Date   WBC 6.4 12/11/2016   HGB 10.3 (L) 12/11/2016   HCT 32.6 (L) 12/11/2016   MCV 90 12/11/2016   PLT 285 12/11/2016     Chemistry      Component Value Date/Time   NA 137 12/11/2016 1038   NA 137 08/08/2016 0948   K 3.9 12/11/2016 1038   K 4.2 08/08/2016 0948   CL 100 12/11/2016 1038   CO2 27 12/11/2016 1038   CO2 23 08/08/2016 0948   BUN 19 12/11/2016 1038   BUN 40.4 (H) 08/08/2016 0948   CREATININE 1.1 12/11/2016 1038   CREATININE 1.4 (H) 08/08/2016 0948      Component Value Date/Time   CALCIUM 8.6 12/11/2016 1038   CALCIUM 9.6 08/08/2016 0948   ALKPHOS 93 (H) 12/11/2016 1038   ALKPHOS 99 08/08/2016 0948   AST 16 12/11/2016 1038   AST 11 08/08/2016 0948   ALT 19 12/11/2016 1038   ALT 26 08/08/2016 0948   BILITOT 0.60 12/11/2016 1038   BILITOT 0.52 08/08/2016 0948         Impression and Plan: Brooke Rios is  an 83 year old white female. She has metastatic kidney cancer. This is clear-cell carcinoma. She presented with a solitary CNS met. This was treated with stereotactic radiosurgery. She has lung metastasis. She has the right kidney as a primary. On her recent CT scan looked  like she had a left adrenal met.  I spent about an hour with she and her 2 daughters. We all are in agreement that we want to give quality of life and comfort are top priority.  I think that we probably need to consider getting hospice involved now. They are in agreement.  We will stop her Nivolumab. I just don't think this is going to be helpful at this point.   I'm getting scans on her. We'll see what the CT scans show. She cannot have an MRI of the brain because  of a pacemaker.   I just feel bad that she had done so well and then over the past couple weeks has just declined. Again, it seems as if she may have had a cerebrovascular incident. Maybe the CAT scan will show [place]. Maybe the CAT scan will show that she has to does have progressive metastatic disease in her brain.   I am repeating her urine culture.   Her labs do not look all that bad.   I probably will get her back in 2 more weeks and we will see how she is doing.  Volanda Napoleon, MD 1/23/20181:35 PM

## 2016-12-11 NOTE — Telephone Encounter (Signed)
Referral made to [facility] per Dr. Marin Olp order.

## 2016-12-12 LAB — URINE CULTURE

## 2016-12-12 MED ORDER — PANTOPRAZOLE SODIUM 40 MG PO TBEC: 40.0000 mg | DELAYED_RELEASE_TABLET | Freq: Every day | ORAL | 2 refills | Status: DC

## 2016-12-12 MED ORDER — DEXAMETHASONE 4 MG PO TABS: ORAL_TABLET | ORAL | 1 refills | Status: DC

## 2016-12-12 NOTE — Addendum Note (Signed)
Addended by: Volanda Napoleon on: 12/12/2016 07:36 AM   Modules accepted: Orders

## 2016-12-13 ENCOUNTER — Telehealth: Payer: Self-pay | Admitting: *Deleted

## 2016-12-13 DIAGNOSIS — I4891 Unspecified atrial fibrillation: Secondary | ICD-10-CM | POA: Diagnosis not present

## 2016-12-13 DIAGNOSIS — C649 Malignant neoplasm of unspecified kidney, except renal pelvis: Secondary | ICD-10-CM | POA: Diagnosis not present

## 2016-12-13 DIAGNOSIS — I10 Essential (primary) hypertension: Secondary | ICD-10-CM | POA: Diagnosis not present

## 2016-12-13 DIAGNOSIS — C7970 Secondary malignant neoplasm of unspecified adrenal gland: Secondary | ICD-10-CM | POA: Diagnosis not present

## 2016-12-13 DIAGNOSIS — K219 Gastro-esophageal reflux disease without esophagitis: Secondary | ICD-10-CM | POA: Diagnosis not present

## 2016-12-13 DIAGNOSIS — H04123 Dry eye syndrome of bilateral lacrimal glands: Secondary | ICD-10-CM | POA: Diagnosis not present

## 2016-12-13 DIAGNOSIS — M1990 Unspecified osteoarthritis, unspecified site: Secondary | ICD-10-CM | POA: Diagnosis not present

## 2016-12-13 DIAGNOSIS — I495 Sick sinus syndrome: Secondary | ICD-10-CM | POA: Diagnosis not present

## 2016-12-13 DIAGNOSIS — C7931 Secondary malignant neoplasm of brain: Secondary | ICD-10-CM | POA: Diagnosis not present

## 2016-12-13 DIAGNOSIS — N390 Urinary tract infection, site not specified: Secondary | ICD-10-CM | POA: Diagnosis not present

## 2016-12-13 DIAGNOSIS — R4182 Altered mental status, unspecified: Secondary | ICD-10-CM | POA: Diagnosis not present

## 2016-12-13 DIAGNOSIS — I25119 Atherosclerotic heart disease of native coronary artery with unspecified angina pectoris: Secondary | ICD-10-CM | POA: Diagnosis not present

## 2016-12-13 DIAGNOSIS — E785 Hyperlipidemia, unspecified: Secondary | ICD-10-CM | POA: Diagnosis not present

## 2016-12-13 DIAGNOSIS — C7800 Secondary malignant neoplasm of unspecified lung: Secondary | ICD-10-CM | POA: Diagnosis not present

## 2016-12-13 DIAGNOSIS — N183 Chronic kidney disease, stage 3 (moderate): Secondary | ICD-10-CM | POA: Diagnosis not present

## 2016-12-13 NOTE — Telephone Encounter (Addendum)
Patient's daughter aware of results  ----- Message from Volanda Napoleon, MD sent at 12/13/2016  7:29 AM EST ----- Call her dgtr - urine culture is negative now!!!  pete

## 2016-12-14 ENCOUNTER — Encounter: Payer: Self-pay | Admitting: Hematology & Oncology

## 2016-12-14 DIAGNOSIS — R4182 Altered mental status, unspecified: Secondary | ICD-10-CM | POA: Diagnosis not present

## 2016-12-14 DIAGNOSIS — C7800 Secondary malignant neoplasm of unspecified lung: Secondary | ICD-10-CM | POA: Diagnosis not present

## 2016-12-14 DIAGNOSIS — C649 Malignant neoplasm of unspecified kidney, except renal pelvis: Secondary | ICD-10-CM | POA: Diagnosis not present

## 2016-12-14 DIAGNOSIS — C7931 Secondary malignant neoplasm of brain: Secondary | ICD-10-CM | POA: Diagnosis not present

## 2016-12-14 DIAGNOSIS — C7970 Secondary malignant neoplasm of unspecified adrenal gland: Secondary | ICD-10-CM | POA: Diagnosis not present

## 2016-12-14 DIAGNOSIS — I25119 Atherosclerotic heart disease of native coronary artery with unspecified angina pectoris: Secondary | ICD-10-CM | POA: Diagnosis not present

## 2016-12-17 ENCOUNTER — Other Ambulatory Visit: Payer: Medicare Other

## 2016-12-17 ENCOUNTER — Ambulatory Visit: Payer: Medicare Other

## 2016-12-17 ENCOUNTER — Ambulatory Visit: Payer: Medicare Other | Admitting: Hematology & Oncology

## 2016-12-17 DIAGNOSIS — C7800 Secondary malignant neoplasm of unspecified lung: Secondary | ICD-10-CM | POA: Diagnosis not present

## 2016-12-17 DIAGNOSIS — R4182 Altered mental status, unspecified: Secondary | ICD-10-CM | POA: Diagnosis not present

## 2016-12-17 DIAGNOSIS — C7970 Secondary malignant neoplasm of unspecified adrenal gland: Secondary | ICD-10-CM | POA: Diagnosis not present

## 2016-12-17 DIAGNOSIS — C7931 Secondary malignant neoplasm of brain: Secondary | ICD-10-CM | POA: Diagnosis not present

## 2016-12-17 DIAGNOSIS — I25119 Atherosclerotic heart disease of native coronary artery with unspecified angina pectoris: Secondary | ICD-10-CM | POA: Diagnosis not present

## 2016-12-17 DIAGNOSIS — C649 Malignant neoplasm of unspecified kidney, except renal pelvis: Secondary | ICD-10-CM | POA: Diagnosis not present

## 2016-12-18 ENCOUNTER — Encounter: Payer: Self-pay | Admitting: Hematology & Oncology

## 2016-12-18 DIAGNOSIS — C7970 Secondary malignant neoplasm of unspecified adrenal gland: Secondary | ICD-10-CM | POA: Diagnosis not present

## 2016-12-18 DIAGNOSIS — R4182 Altered mental status, unspecified: Secondary | ICD-10-CM | POA: Diagnosis not present

## 2016-12-18 DIAGNOSIS — I25119 Atherosclerotic heart disease of native coronary artery with unspecified angina pectoris: Secondary | ICD-10-CM | POA: Diagnosis not present

## 2016-12-18 DIAGNOSIS — C7931 Secondary malignant neoplasm of brain: Secondary | ICD-10-CM | POA: Diagnosis not present

## 2016-12-18 DIAGNOSIS — C7800 Secondary malignant neoplasm of unspecified lung: Secondary | ICD-10-CM | POA: Diagnosis not present

## 2016-12-18 DIAGNOSIS — C649 Malignant neoplasm of unspecified kidney, except renal pelvis: Secondary | ICD-10-CM | POA: Diagnosis not present

## 2016-12-20 DIAGNOSIS — R4182 Altered mental status, unspecified: Secondary | ICD-10-CM | POA: Diagnosis not present

## 2016-12-20 DIAGNOSIS — E785 Hyperlipidemia, unspecified: Secondary | ICD-10-CM | POA: Diagnosis not present

## 2016-12-20 DIAGNOSIS — M1990 Unspecified osteoarthritis, unspecified site: Secondary | ICD-10-CM | POA: Diagnosis not present

## 2016-12-20 DIAGNOSIS — I4891 Unspecified atrial fibrillation: Secondary | ICD-10-CM | POA: Diagnosis not present

## 2016-12-20 DIAGNOSIS — I10 Essential (primary) hypertension: Secondary | ICD-10-CM | POA: Diagnosis not present

## 2016-12-20 DIAGNOSIS — I495 Sick sinus syndrome: Secondary | ICD-10-CM | POA: Diagnosis not present

## 2016-12-20 DIAGNOSIS — K219 Gastro-esophageal reflux disease without esophagitis: Secondary | ICD-10-CM | POA: Diagnosis not present

## 2016-12-20 DIAGNOSIS — N183 Chronic kidney disease, stage 3 (moderate): Secondary | ICD-10-CM | POA: Diagnosis not present

## 2016-12-20 DIAGNOSIS — N390 Urinary tract infection, site not specified: Secondary | ICD-10-CM | POA: Diagnosis not present

## 2016-12-20 DIAGNOSIS — C7800 Secondary malignant neoplasm of unspecified lung: Secondary | ICD-10-CM | POA: Diagnosis not present

## 2016-12-20 DIAGNOSIS — C649 Malignant neoplasm of unspecified kidney, except renal pelvis: Secondary | ICD-10-CM | POA: Diagnosis not present

## 2016-12-20 DIAGNOSIS — I25119 Atherosclerotic heart disease of native coronary artery with unspecified angina pectoris: Secondary | ICD-10-CM | POA: Diagnosis not present

## 2016-12-20 DIAGNOSIS — C7931 Secondary malignant neoplasm of brain: Secondary | ICD-10-CM | POA: Diagnosis not present

## 2016-12-20 DIAGNOSIS — H04123 Dry eye syndrome of bilateral lacrimal glands: Secondary | ICD-10-CM | POA: Diagnosis not present

## 2016-12-20 DIAGNOSIS — C7970 Secondary malignant neoplasm of unspecified adrenal gland: Secondary | ICD-10-CM | POA: Diagnosis not present

## 2016-12-25 ENCOUNTER — Other Ambulatory Visit: Payer: Medicare Other

## 2016-12-25 ENCOUNTER — Ambulatory Visit: Payer: Medicare Other

## 2016-12-26 ENCOUNTER — Other Ambulatory Visit (HOSPITAL_BASED_OUTPATIENT_CLINIC_OR_DEPARTMENT_OTHER)

## 2016-12-26 ENCOUNTER — Ambulatory Visit: Payer: Medicare Other

## 2016-12-26 ENCOUNTER — Ambulatory Visit (HOSPITAL_BASED_OUTPATIENT_CLINIC_OR_DEPARTMENT_OTHER): Payer: Medicare Other | Admitting: Hematology & Oncology

## 2016-12-26 ENCOUNTER — Ambulatory Visit (HOSPITAL_BASED_OUTPATIENT_CLINIC_OR_DEPARTMENT_OTHER): Payer: Medicare Other

## 2016-12-26 ENCOUNTER — Encounter: Payer: Self-pay | Admitting: Hematology & Oncology

## 2016-12-26 DIAGNOSIS — Z452 Encounter for adjustment and management of vascular access device: Secondary | ICD-10-CM | POA: Diagnosis not present

## 2016-12-26 DIAGNOSIS — C7951 Secondary malignant neoplasm of bone: Secondary | ICD-10-CM | POA: Diagnosis not present

## 2016-12-26 DIAGNOSIS — I480 Paroxysmal atrial fibrillation: Secondary | ICD-10-CM | POA: Diagnosis not present

## 2016-12-26 DIAGNOSIS — C7801 Secondary malignant neoplasm of right lung: Principal | ICD-10-CM

## 2016-12-26 DIAGNOSIS — C641 Malignant neoplasm of right kidney, except renal pelvis: Secondary | ICD-10-CM

## 2016-12-26 DIAGNOSIS — Z95828 Presence of other vascular implants and grafts: Secondary | ICD-10-CM

## 2016-12-26 DIAGNOSIS — C649 Malignant neoplasm of unspecified kidney, except renal pelvis: Secondary | ICD-10-CM

## 2016-12-26 DIAGNOSIS — Z7189 Other specified counseling: Secondary | ICD-10-CM

## 2016-12-26 DIAGNOSIS — R410 Disorientation, unspecified: Secondary | ICD-10-CM

## 2016-12-26 DIAGNOSIS — I482 Chronic atrial fibrillation, unspecified: Secondary | ICD-10-CM

## 2016-12-26 HISTORY — DX: Other specified counseling: Z71.89

## 2016-12-26 HISTORY — DX: Malignant neoplasm of unspecified kidney, except renal pelvis: C64.9

## 2016-12-26 LAB — CBC WITH DIFFERENTIAL (CANCER CENTER ONLY)
BASO#: 0 10*3/uL (ref 0.0–0.2)
BASO%: 0.1 % (ref 0.0–2.0)
EOS%: 0.1 % (ref 0.0–7.0)
Eosinophils Absolute: 0 10*3/uL (ref 0.0–0.5)
HEMATOCRIT: 36.2 % (ref 34.8–46.6)
HEMOGLOBIN: 11.3 g/dL — AB (ref 11.6–15.9)
LYMPH#: 0.6 10*3/uL — AB (ref 0.9–3.3)
LYMPH%: 5.3 % — ABNORMAL LOW (ref 14.0–48.0)
MCH: 27.6 pg (ref 26.0–34.0)
MCHC: 31.2 g/dL — ABNORMAL LOW (ref 32.0–36.0)
MCV: 88 fL (ref 81–101)
MONO#: 0.5 10*3/uL (ref 0.1–0.9)
MONO%: 4.4 % (ref 0.0–13.0)
NEUT#: 10.4 10*3/uL — ABNORMAL HIGH (ref 1.5–6.5)
NEUT%: 90.1 % — AB (ref 39.6–80.0)
Platelets: 236 10*3/uL (ref 145–400)
RBC: 4.1 10*6/uL (ref 3.70–5.32)
RDW: 16.3 % — ABNORMAL HIGH (ref 11.1–15.7)
WBC: 11.5 10*3/uL — ABNORMAL HIGH (ref 3.9–10.0)

## 2016-12-26 LAB — CMP (CANCER CENTER ONLY)
ALBUMIN: 3 g/dL — AB (ref 3.3–5.5)
ALT(SGPT): 43 U/L (ref 10–47)
AST: 15 U/L (ref 11–38)
Alkaline Phosphatase: 87 U/L — ABNORMAL HIGH (ref 26–84)
BILIRUBIN TOTAL: 0.6 mg/dL (ref 0.20–1.60)
BUN, Bld: 31 mg/dL — ABNORMAL HIGH (ref 7–22)
CALCIUM: 9 mg/dL (ref 8.0–10.3)
CO2: 25 meq/L (ref 18–33)
Chloride: 101 mEq/L (ref 98–108)
Creat: 1.5 mg/dl — ABNORMAL HIGH (ref 0.6–1.2)
Glucose, Bld: 137 mg/dL — ABNORMAL HIGH (ref 73–118)
Potassium: 4.9 mEq/L — ABNORMAL HIGH (ref 3.3–4.7)
Sodium: 137 mEq/L (ref 128–145)
Total Protein: 6.2 g/dL — ABNORMAL LOW (ref 6.4–8.1)

## 2016-12-26 MED ORDER — SODIUM CHLORIDE 0.9% FLUSH
10.0000 mL | INTRAVENOUS | Status: AC | PRN
  Administered 2016-12-26: 10 mL via INTRAVENOUS
  Filled 2016-12-26: qty 10

## 2016-12-26 MED ORDER — HEPARIN SOD (PORK) LOCK FLUSH 100 UNIT/ML IV SOLN
500.0000 [IU] | Freq: Once | INTRAVENOUS | Status: AC
  Administered 2016-12-26: 500 [IU] via INTRAVENOUS
  Filled 2016-12-26: qty 5

## 2016-12-26 NOTE — Progress Notes (Signed)
Hematology and Oncology Follow Up Visit  Brooke Rios [id number] [birth year] 83 y.o. 12/26/2016   Principle Diagnosis:   Metastatic  RIGHT renal cell carcinoma-progressive  Paroxysmal atrial fibrillation  Current Therapy:    Votrient 400 mg by mouth daily  Nivolumab 272m IV q 2 week - start 10/30/2016 - s/p c#3  Status post stereotactic radiosurgery for solitary brain met  ELIQUIS 2.5 mg by mouth twice a day     Interim History:  [name] back for follow-up.she actually looks a whole lot better. Maybe, treating the urinary tract infection helped. Unfortunately, we did her restaging studies, she has recurrence of the brain met. We cannot do MRI because of a pacemaker. The lesion is in the right frontal lobe measuring 3.1 x 3.9 x 2.7 cm. She had some midline shift. We put her on some Decadron. I did this probably helped her quite a bit. She began to eat better. She was much more alert.  Currently she is on 12 mg they have Decadron. I will see like and her down to 8 mg a day.   She does have systemic progression. We did CT scans of her body. She has increase in pulmonary nodules. She has some new bone metastases. She has enlargement of right renal mass and left adrenal gland mass.   Hospice is now seen her. I think hospice is a great idea. However, I also think that we need to treat this brain lesion so that she does not become neurologically impaired by it.   She has had no problems with pain. She's not fallen down. She's had no visual issues. She's had no cough or shortness of breath. She's had no change in bowel or bladder habits.   Currently, her performance status is ECOG 2-3   Medications:  Current Outpatient Prescriptions:  .  acetaminophen (TYLENOL) 500 MG tablet, Take 500 mg by mouth every 6 (six) hours as needed., Disp: , Rfl:  .  apixaban (ELIQUIS) 2.5 MG TABS tablet, Take 1 tablet (2.5 mg total) by mouth 2 (two) times daily., Disp: 180 tablet, Rfl: 3 .  [facility]  MELATONIN 5 MG TABS, Take 1 tablet by mouth at bedtime., Disp: , Rfl: 2 .  dexamethasone (DECADRON) 4 MG tablet, Take 5 tablets at one time today with food. Starting tomorrow, take 3 pills a day with food (Patient taking differently: Take 3 pills a day with food), Disp: 100 tablet, Rfl: 1 .  docusate sodium (COLACE) 100 MG capsule, Take 100 mg by mouth 2 (two) times daily., Disp: , Rfl:  .  isosorbide mononitrate (IMDUR) 30 MG 24 hr tablet, TAKE 1 TABLET BY MOUTH TWICE A DAY, Disp: 90 tablet, Rfl: 0 .  lidocaine-prilocaine (EMLA) cream, Apply 1 application topically as needed., Disp: 30 g, Rfl: 3 .  metoprolol succinate (TOPROL-XL) 50 MG 24 hr tablet, Take 1 tablet (50 mg total) by mouth every morning. Take with or immediately following a meal. (Patient taking differently: Take 50 mg by mouth daily. Take with or immediately following a meal.), Disp: 90 tablet, Rfl: 3 .  nitroGLYCERIN (NITROSTAT) 0.4 MG SL tablet, Place 1 tablet (0.4 mg total) under the tongue every 5 (five) minutes as needed for chest pain (MAX 3 TABLETS)., Disp: 25 tablet, Rfl: 1 .  pantoprazole (PROTONIX) 40 MG tablet, Take 1 tablet (40 mg total) by mouth daily., Disp: 30 tablet, Rfl: 2 .  polyethylene glycol (MIRALAX / GLYCOLAX) packet, Take 17 g by mouth daily as needed (constipation).  Mix in 8 oz liquid and drink , Disp: , Rfl:  .  polyvinyl alcohol (LIQUIFILM TEARS) 1.4 % ophthalmic solution, Place 1 drop into both eyes every hour as needed for dry eyes., Disp: 15 mL, Rfl: 0 .  temazepam (RESTORIL) 15 MG capsule, TAKE 1 CAPSULE BY MOUTH AT BEDTIME AS NEEDED FOR INSOMNIA MAY REPEAT X 1 DOSE, Disp: , Rfl: 2 .  traMADol (ULTRAM) 50 MG tablet, Take 1 tablet (50 mg total) by mouth every 6 (six) hours as needed., Disp: 90 tablet, Rfl: 1 No current facility-administered medications for this visit.   Facility-Administered Medications Ordered in Other Visits:  .  sodium chloride flush (NS) 0.9 % injection 10 mL, 10 mL, Intravenous, PRN,  Brooke Rios [place], NP, 10 mL at 12/26/16 1447  Allergies:  Allergies  Allergen Reactions  . Cefpodoxime Other (See Comments)    Nocturia & disturbed sleep  . Codeine     nausea    Past Medical History, Surgical history, Social history, and Family History were reviewed and updated.  Review of Systems: Has above  Physical Exam:  weight is 178 lb (80.7 kg). Her oral temperature is 97.5 F (36.4 C). Her blood pressure is 117/65 and her pulse is 75. Her respiration is 16.   Wt Readings from Last 3 Encounters:  12/26/16 178 lb (80.7 kg)  12/04/16 179 lb (81.2 kg)  11/16/16 178 lb (80.7 kg)     Elderly white female. She has extensive ecchymoses on her face. She has some ptosis of the right eye. She has a little bit of hemorrhage of the right conjunctiva. There is no adenopathy in the neck. Her thyroid is nonpalpable. Lungs are clear bilaterally. Cardiac exam regular rate and rhythm with an occasional extra beat. There is no murmurs, rubs or bruits. Abdomen is soft. She has good bowel sounds. There is no fluid wave. There is no palpable liver or spleen tip. Back exam shows no tenderness over the spine, ribs or hips. Extremities shows some trace edema in her lower extremities. Skin exam shows the extensive facial ecchymoses. Neurological exam shows no focal neurological deficits.  Lab Results  Component Value Date   WBC 11.5 (H) 12/26/2016   HGB 11.3 (L) 12/26/2016   HCT 36.2 12/26/2016   MCV 88 12/26/2016   PLT 236 12/26/2016     Chemistry      Component Value Date/Time   NA 137 12/26/2016 1303   NA 137 08/08/2016 0948   K 4.9 (H) 12/26/2016 1303   K 4.2 08/08/2016 0948   CL 101 12/26/2016 1303   CO2 25 12/26/2016 1303   CO2 23 08/08/2016 0948   BUN 31 (H) 12/26/2016 1303   BUN 40.4 (H) 08/08/2016 0948   CREATININE 1.5 (H) 12/26/2016 1303   CREATININE 1.4 (H) 08/08/2016 0948      Component Value Date/Time   CALCIUM 9.0 12/26/2016 1303   CALCIUM 9.6 08/08/2016 0948    ALKPHOS 87 (H) 12/26/2016 1303   ALKPHOS 99 08/08/2016 0948   AST 15 12/26/2016 1303   AST 11 08/08/2016 0948   ALT 43 12/26/2016 1303   ALT 26 08/08/2016 0948   BILITOT 0.60 12/26/2016 1303   BILITOT 0.52 08/08/2016 0948         Impression and Plan: Ms. Sidener is  an 83 year old white female. She has metastatic kidney cancer. This is clear-cell carcinoma. She presented with a solitary CNS met. This was treated with stereotactic radiosurgery. She had lung metastasis. She has the  right kidney as a primary.   I think that it is clear that she is progressing. Again, we had a long talk. I spent about 45 minutes with she, a daughter and son-in-law. She is really not aware that she has new bony metastasis. Her daughter does not want her to know this. I think this is reasonable.   I do think that she would benefit from [place]. I think Delton See will help minimize bone complications. With her being on dexamethasone, I think this will increase her risk of osteoporosis. I talked her about this. I think Xgeva every couple months or so would be reasonable and helpful.   We did have a long discussion about goals of care. She has always wanted quality of life as her primary goal. She does not want anything aggressive to be done if she were to have a cardiopulmonary event. She would not want to be kept alive on a machine. I think her family agrees with this. I definitely agree with this.   We want to focus on her quality of life.   I will like to see her back in about 3 weeks. I spoke with radiation oncology about retreating this area in the right frontal lobe. I just cannot believe that she is be going through neurosurgery and a craniectomy with her other comorbid conditions and her progressive systemic disease.   She does not want any surgery. Her family would not want her to have any surgery.   Volanda Napoleon, MD 2/7/20183:13 PM

## 2016-12-26 NOTE — Addendum Note (Signed)
Addended by: Smiley Houseman F on: 12/26/2016 02:47 PM   Modules accepted: Orders, SmartSet

## 2016-12-27 ENCOUNTER — Encounter: Payer: Self-pay | Admitting: Hematology & Oncology

## 2016-12-27 LAB — LACTATE DEHYDROGENASE: LDH: 240 U/L (ref 125–245)

## 2016-12-28 DIAGNOSIS — C7931 Secondary malignant neoplasm of brain: Secondary | ICD-10-CM | POA: Diagnosis not present

## 2016-12-28 DIAGNOSIS — I25119 Atherosclerotic heart disease of native coronary artery with unspecified angina pectoris: Secondary | ICD-10-CM | POA: Diagnosis not present

## 2016-12-28 DIAGNOSIS — C7970 Secondary malignant neoplasm of unspecified adrenal gland: Secondary | ICD-10-CM | POA: Diagnosis not present

## 2016-12-28 DIAGNOSIS — C649 Malignant neoplasm of unspecified kidney, except renal pelvis: Secondary | ICD-10-CM | POA: Diagnosis not present

## 2016-12-28 DIAGNOSIS — R4182 Altered mental status, unspecified: Secondary | ICD-10-CM | POA: Diagnosis not present

## 2016-12-28 DIAGNOSIS — C7800 Secondary malignant neoplasm of unspecified lung: Secondary | ICD-10-CM | POA: Diagnosis not present

## 2016-12-29 DIAGNOSIS — C7800 Secondary malignant neoplasm of unspecified lung: Secondary | ICD-10-CM | POA: Diagnosis not present

## 2016-12-29 DIAGNOSIS — I25119 Atherosclerotic heart disease of native coronary artery with unspecified angina pectoris: Secondary | ICD-10-CM | POA: Diagnosis not present

## 2016-12-29 DIAGNOSIS — C7931 Secondary malignant neoplasm of brain: Secondary | ICD-10-CM | POA: Diagnosis not present

## 2016-12-29 DIAGNOSIS — R4182 Altered mental status, unspecified: Secondary | ICD-10-CM | POA: Diagnosis not present

## 2016-12-29 DIAGNOSIS — C7970 Secondary malignant neoplasm of unspecified adrenal gland: Secondary | ICD-10-CM | POA: Diagnosis not present

## 2016-12-29 DIAGNOSIS — C649 Malignant neoplasm of unspecified kidney, except renal pelvis: Secondary | ICD-10-CM | POA: Diagnosis not present

## 2016-12-30 ENCOUNTER — Encounter: Payer: Self-pay | Admitting: Hematology & Oncology

## 2016-12-31 ENCOUNTER — Encounter: Payer: Self-pay | Admitting: Hematology & Oncology

## 2016-12-31 ENCOUNTER — Other Ambulatory Visit: Payer: Self-pay | Admitting: Radiation Oncology

## 2016-12-31 DIAGNOSIS — I25119 Atherosclerotic heart disease of native coronary artery with unspecified angina pectoris: Secondary | ICD-10-CM | POA: Diagnosis not present

## 2016-12-31 DIAGNOSIS — C7931 Secondary malignant neoplasm of brain: Secondary | ICD-10-CM

## 2016-12-31 DIAGNOSIS — R4182 Altered mental status, unspecified: Secondary | ICD-10-CM | POA: Diagnosis not present

## 2016-12-31 DIAGNOSIS — C7970 Secondary malignant neoplasm of unspecified adrenal gland: Secondary | ICD-10-CM | POA: Diagnosis not present

## 2016-12-31 DIAGNOSIS — C7800 Secondary malignant neoplasm of unspecified lung: Secondary | ICD-10-CM | POA: Diagnosis not present

## 2016-12-31 DIAGNOSIS — C649 Malignant neoplasm of unspecified kidney, except renal pelvis: Secondary | ICD-10-CM | POA: Diagnosis not present

## 2016-12-31 MED ORDER — VITAMIN E 180 MG (400 UNIT) PO CAPS: ORAL_CAPSULE | ORAL | 5 refills | Status: DC

## 2016-12-31 MED ORDER — PENTOXIFYLLINE ER 400 MG PO TBCR: EXTENDED_RELEASE_TABLET | ORAL | 5 refills | Status: DC

## 2017-01-01 ENCOUNTER — Telehealth: Payer: Self-pay | Admitting: *Deleted

## 2017-01-01 NOTE — Telephone Encounter (Signed)
Received a call informing office that patient is revoking her hospice benefit in order to receive Xgeva. Hospice will discharge patient prior to March 1st 2018.

## 2017-01-04 DIAGNOSIS — C7970 Secondary malignant neoplasm of unspecified adrenal gland: Secondary | ICD-10-CM | POA: Diagnosis not present

## 2017-01-04 DIAGNOSIS — R4182 Altered mental status, unspecified: Secondary | ICD-10-CM | POA: Diagnosis not present

## 2017-01-04 DIAGNOSIS — C649 Malignant neoplasm of unspecified kidney, except renal pelvis: Secondary | ICD-10-CM | POA: Diagnosis not present

## 2017-01-04 DIAGNOSIS — I25119 Atherosclerotic heart disease of native coronary artery with unspecified angina pectoris: Secondary | ICD-10-CM | POA: Diagnosis not present

## 2017-01-04 DIAGNOSIS — C7931 Secondary malignant neoplasm of brain: Secondary | ICD-10-CM | POA: Diagnosis not present

## 2017-01-04 DIAGNOSIS — C7800 Secondary malignant neoplasm of unspecified lung: Secondary | ICD-10-CM | POA: Diagnosis not present

## 2017-01-07 DIAGNOSIS — C7931 Secondary malignant neoplasm of brain: Secondary | ICD-10-CM | POA: Diagnosis not present

## 2017-01-07 DIAGNOSIS — R4182 Altered mental status, unspecified: Secondary | ICD-10-CM | POA: Diagnosis not present

## 2017-01-07 DIAGNOSIS — C7800 Secondary malignant neoplasm of unspecified lung: Secondary | ICD-10-CM | POA: Diagnosis not present

## 2017-01-07 DIAGNOSIS — C649 Malignant neoplasm of unspecified kidney, except renal pelvis: Secondary | ICD-10-CM | POA: Diagnosis not present

## 2017-01-07 DIAGNOSIS — I25119 Atherosclerotic heart disease of native coronary artery with unspecified angina pectoris: Secondary | ICD-10-CM | POA: Diagnosis not present

## 2017-01-07 DIAGNOSIS — C7970 Secondary malignant neoplasm of unspecified adrenal gland: Secondary | ICD-10-CM | POA: Diagnosis not present

## 2017-01-08 ENCOUNTER — Ambulatory Visit: Payer: Medicare Other

## 2017-01-08 ENCOUNTER — Other Ambulatory Visit: Payer: Medicare Other

## 2017-01-08 ENCOUNTER — Ambulatory Visit: Payer: Medicare Other | Admitting: Hematology & Oncology

## 2017-01-08 DIAGNOSIS — R4182 Altered mental status, unspecified: Secondary | ICD-10-CM | POA: Diagnosis not present

## 2017-01-08 DIAGNOSIS — C7931 Secondary malignant neoplasm of brain: Secondary | ICD-10-CM | POA: Diagnosis not present

## 2017-01-08 DIAGNOSIS — C7800 Secondary malignant neoplasm of unspecified lung: Secondary | ICD-10-CM | POA: Diagnosis not present

## 2017-01-08 DIAGNOSIS — C649 Malignant neoplasm of unspecified kidney, except renal pelvis: Secondary | ICD-10-CM | POA: Diagnosis not present

## 2017-01-08 DIAGNOSIS — I25119 Atherosclerotic heart disease of native coronary artery with unspecified angina pectoris: Secondary | ICD-10-CM | POA: Diagnosis not present

## 2017-01-08 DIAGNOSIS — C7970 Secondary malignant neoplasm of unspecified adrenal gland: Secondary | ICD-10-CM | POA: Diagnosis not present

## 2017-01-09 ENCOUNTER — Other Ambulatory Visit: Payer: Medicare Other

## 2017-01-09 ENCOUNTER — Ambulatory Visit: Payer: Medicare Other | Admitting: Hematology & Oncology

## 2017-01-09 ENCOUNTER — Ambulatory Visit: Payer: Medicare Other

## 2017-01-11 ENCOUNTER — Encounter: Payer: Self-pay | Admitting: Hematology & Oncology

## 2017-01-11 DIAGNOSIS — C649 Malignant neoplasm of unspecified kidney, except renal pelvis: Secondary | ICD-10-CM | POA: Diagnosis not present

## 2017-01-11 DIAGNOSIS — C7931 Secondary malignant neoplasm of brain: Secondary | ICD-10-CM | POA: Diagnosis not present

## 2017-01-11 DIAGNOSIS — I25119 Atherosclerotic heart disease of native coronary artery with unspecified angina pectoris: Secondary | ICD-10-CM | POA: Diagnosis not present

## 2017-01-11 DIAGNOSIS — C7800 Secondary malignant neoplasm of unspecified lung: Secondary | ICD-10-CM | POA: Diagnosis not present

## 2017-01-11 DIAGNOSIS — C7970 Secondary malignant neoplasm of unspecified adrenal gland: Secondary | ICD-10-CM | POA: Diagnosis not present

## 2017-01-11 DIAGNOSIS — R4182 Altered mental status, unspecified: Secondary | ICD-10-CM | POA: Diagnosis not present

## 2017-01-16 ENCOUNTER — Other Ambulatory Visit: Payer: Self-pay | Admitting: *Deleted

## 2017-01-16 DIAGNOSIS — C7800 Secondary malignant neoplasm of unspecified lung: Secondary | ICD-10-CM | POA: Diagnosis not present

## 2017-01-16 DIAGNOSIS — C649 Malignant neoplasm of unspecified kidney, except renal pelvis: Secondary | ICD-10-CM | POA: Diagnosis not present

## 2017-01-16 DIAGNOSIS — C7931 Secondary malignant neoplasm of brain: Secondary | ICD-10-CM | POA: Diagnosis not present

## 2017-01-16 DIAGNOSIS — I25119 Atherosclerotic heart disease of native coronary artery with unspecified angina pectoris: Secondary | ICD-10-CM | POA: Diagnosis not present

## 2017-01-16 DIAGNOSIS — C7970 Secondary malignant neoplasm of unspecified adrenal gland: Secondary | ICD-10-CM | POA: Diagnosis not present

## 2017-01-16 DIAGNOSIS — R4182 Altered mental status, unspecified: Secondary | ICD-10-CM | POA: Diagnosis not present

## 2017-01-17 ENCOUNTER — Ambulatory Visit (HOSPITAL_BASED_OUTPATIENT_CLINIC_OR_DEPARTMENT_OTHER): Payer: Medicare Other | Admitting: Hematology & Oncology

## 2017-01-17 ENCOUNTER — Other Ambulatory Visit (HOSPITAL_BASED_OUTPATIENT_CLINIC_OR_DEPARTMENT_OTHER): Payer: Medicare Other

## 2017-01-17 ENCOUNTER — Ambulatory Visit: Payer: No Typology Code available for payment source

## 2017-01-17 ENCOUNTER — Ambulatory Visit (HOSPITAL_BASED_OUTPATIENT_CLINIC_OR_DEPARTMENT_OTHER): Payer: Medicare Other

## 2017-01-17 VITALS — BP 113/54 | HR 69 | Temp 97.9°F | Resp 16 | Wt 174.0 lb

## 2017-01-17 DIAGNOSIS — C641 Malignant neoplasm of right kidney, except renal pelvis: Secondary | ICD-10-CM

## 2017-01-17 DIAGNOSIS — C649 Malignant neoplasm of unspecified kidney, except renal pelvis: Secondary | ICD-10-CM

## 2017-01-17 DIAGNOSIS — C7931 Secondary malignant neoplasm of brain: Principal | ICD-10-CM

## 2017-01-17 DIAGNOSIS — C7951 Secondary malignant neoplasm of bone: Secondary | ICD-10-CM

## 2017-01-17 DIAGNOSIS — I480 Paroxysmal atrial fibrillation: Secondary | ICD-10-CM

## 2017-01-17 DIAGNOSIS — C7800 Secondary malignant neoplasm of unspecified lung: Principal | ICD-10-CM

## 2017-01-17 LAB — CMP (CANCER CENTER ONLY)
ALT(SGPT): 34 U/L (ref 10–47)
AST: 21 U/L (ref 11–38)
Albumin: 3 g/dL — ABNORMAL LOW (ref 3.3–5.5)
Alkaline Phosphatase: 86 U/L — ABNORMAL HIGH (ref 26–84)
BILIRUBIN TOTAL: 0.8 mg/dL (ref 0.20–1.60)
BUN: 35 mg/dL — AB (ref 7–22)
CO2: 25 mEq/L (ref 18–33)
CREATININE: 1.3 mg/dL — AB (ref 0.6–1.2)
Calcium: 8.8 mg/dL (ref 8.0–10.3)
Chloride: 100 mEq/L (ref 98–108)
Glucose, Bld: 152 mg/dL — ABNORMAL HIGH (ref 73–118)
Potassium: 4.2 mEq/L (ref 3.3–4.7)
SODIUM: 139 meq/L (ref 128–145)
TOTAL PROTEIN: 6 g/dL — AB (ref 6.4–8.1)

## 2017-01-17 LAB — CBC WITH DIFFERENTIAL (CANCER CENTER ONLY)
BASO#: 0 10*3/uL (ref 0.0–0.2)
BASO%: 0.1 % (ref 0.0–2.0)
EOS%: 0.2 % (ref 0.0–7.0)
Eosinophils Absolute: 0 10*3/uL (ref 0.0–0.5)
HEMATOCRIT: 35.8 % (ref 34.8–46.6)
HEMOGLOBIN: 11.4 g/dL — AB (ref 11.6–15.9)
LYMPH#: 0.7 10*3/uL — AB (ref 0.9–3.3)
LYMPH%: 8.1 % — ABNORMAL LOW (ref 14.0–48.0)
MCH: 27.2 pg (ref 26.0–34.0)
MCHC: 31.8 g/dL — AB (ref 32.0–36.0)
MCV: 85 fL (ref 81–101)
MONO#: 0.4 10*3/uL (ref 0.1–0.9)
MONO%: 4.1 % (ref 0.0–13.0)
NEUT%: 87.5 % — AB (ref 39.6–80.0)
NEUTROS ABS: 7.8 10*3/uL — AB (ref 1.5–6.5)
Platelets: 147 10*3/uL (ref 145–400)
RBC: 4.19 10*6/uL (ref 3.70–5.32)
RDW: 16.3 % — ABNORMAL HIGH (ref 11.1–15.7)
WBC: 8.9 10*3/uL (ref 3.9–10.0)

## 2017-01-17 LAB — LACTATE DEHYDROGENASE: LDH: 261 U/L — AB (ref 125–245)

## 2017-01-17 MED ORDER — DENOSUMAB 120 MG/1.7ML ~~LOC~~ SOLN
120.0000 mg | Freq: Once | SUBCUTANEOUS | Status: AC
  Administered 2017-01-17: 120 mg via SUBCUTANEOUS
  Filled 2017-01-17: qty 1.7

## 2017-01-17 NOTE — Progress Notes (Signed)
Hematology and Oncology Follow Up Visit  Brooke Rios [id number] 04-19-1925 83 y.o. 01/17/2017   Principle Diagnosis:   Metastatic  RIGHT renal cell carcinoma-progressive  Paroxysmal atrial fibrillation  Current Therapy:    Votrient 400 mg by mouth daily  Nivolumab 268m IV q 2 week - start 10/30/2016 - s/p c#3  Status post stereotactic radiosurgery for solitary brain met  ELIQUIS 2.5 mg by mouth twice a day  Xgeva 120 mg subcutaneous every month     Interim History:  [name] back for follow-up. she actually is looking quite good. I know that hospice is seen her.  The issue that we have now is that radiation oncology does not think that they can help with this brain met. She has had past radiosurgery. Which did not result in major tumor shrinkage.  We thought about having neurosurgery resect out the tumor. This was a very difficult decision to make given that she has metastatic disease that is progressing and that she did not have the best of performance status.   Now that she is doing better, I think that we going to have to consider surgery. She is eating well. She is not having pain. She is not having cardiac issues. Overall, her performance status is probably ECOG 1.    Medications:  Current Outpatient Prescriptions:  .  acetaminophen (TYLENOL) 500 MG tablet, Take 500 mg by mouth every 6 (six) hours as needed., Disp: , Rfl:  .  apixaban (ELIQUIS) 2.5 MG TABS tablet, Take 1 tablet (2.5 mg total) by mouth 2 (two) times daily., Disp: 180 tablet, Rfl: 3 .  [facility] MELATONIN 5 MG TABS, Take 1 tablet by mouth at bedtime., Disp: , Rfl: 2 .  dexamethasone (DECADRON) 4 MG tablet, Take 5 tablets at one time today with food. Starting tomorrow, take 3 pills a day with food (Patient taking differently: Take 3 pills a day with food), Disp: 100 tablet, Rfl: 1 .  docusate sodium (COLACE) 100 MG capsule, Take 100 mg by mouth 2 (two) times daily., Disp: , Rfl:  .  isosorbide mononitrate  (IMDUR) 30 MG 24 hr tablet, TAKE 1 TABLET BY MOUTH TWICE A DAY, Disp: 90 tablet, Rfl: 0 .  lidocaine-prilocaine (EMLA) cream, Apply 1 application topically as needed., Disp: 30 g, Rfl: 3 .  metoprolol succinate (TOPROL-XL) 50 MG 24 hr tablet, Take 1 tablet (50 mg total) by mouth every morning. Take with or immediately following a meal. (Patient taking differently: Take 50 mg by mouth daily. Take with or immediately following a meal.), Disp: 90 tablet, Rfl: 3 .  nitroGLYCERIN (NITROSTAT) 0.4 MG SL tablet, Place 1 tablet (0.4 mg total) under the tongue every 5 (five) minutes as needed for chest pain (MAX 3 TABLETS)., Disp: 25 tablet, Rfl: 1 .  pantoprazole (PROTONIX) 40 MG tablet, Take 1 tablet (40 mg total) by mouth daily., Disp: 30 tablet, Rfl: 2 .  pentoxifylline (TRENTAL) 400 MG CR tablet, Take 4073mtab daily x 1 week, then 40031mID; take with food, Disp: 60 tablet, Rfl: 5 .  polyethylene glycol (MIRALAX / GLYCOLAX) packet, Take 17 g by mouth daily as needed (constipation). Mix in 8 oz liquid and drink , Disp: , Rfl:  .  polyvinyl alcohol (LIQUIFILM TEARS) 1.4 % ophthalmic solution, Place 1 drop into both eyes every hour as needed for dry eyes., Disp: 15 mL, Rfl: 0 .  temazepam (RESTORIL) 15 MG capsule, TAKE 1 CAPSULE BY MOUTH AT BEDTIME AS NEEDED FOR INSOMNIA  MAY REPEAT X 1 DOSE, Disp: , Rfl: 2 .  traMADol (ULTRAM) 50 MG tablet, Take 1 tablet (50 mg total) by mouth every 6 (six) hours as needed., Disp: 90 tablet, Rfl: 1 .  vitamin E 400 UNIT capsule, Take 400 IU daily x 1 week, then 400 IU BID, Disp: 60 capsule, Rfl: 5 No current facility-administered medications for this visit.   Facility-Administered Medications Ordered in Other Visits:  .  sodium chloride flush (NS) 0.9 % injection 10 mL, 10 mL, Intravenous, PRN, Holli Humbles [place], NP, 10 mL at 12/26/16 1447  Allergies:  Allergies  Allergen Reactions  . Cefpodoxime Other (See Comments)    Nocturia & disturbed sleep  . Codeine      nausea    Past Medical History, Surgical history, Social history, and Family History were reviewed and updated.  Review of Systems: Has above  Physical Exam:  weight is 174 lb (78.9 kg). Her oral temperature is 97.9 F (36.6 C). Her blood pressure is 113/54 (abnormal) and her pulse is 69. Her respiration is 16 and oxygen saturation is 96%.   Wt Readings from Last 3 Encounters:  01/17/17 174 lb (78.9 kg)  12/26/16 178 lb (80.7 kg)  12/04/16 179 lb (81.2 kg)     Elderly white female. She has extensive ecchymoses on her face. She has some ptosis of the right eye. She has a little bit of hemorrhage of the right conjunctiva. There is no adenopathy in the neck. Her thyroid is nonpalpable. Lungs are clear bilaterally. Cardiac exam regular rate and rhythm with an occasional extra beat. There is no murmurs, rubs or bruits. Abdomen is soft. She has good bowel sounds. There is no fluid wave. There is no palpable liver or spleen tip. Back exam shows no tenderness over the spine, ribs or hips. Extremities shows some trace edema in her lower extremities. Skin exam shows the extensive facial ecchymoses. Neurological exam shows no focal neurological deficits.  Lab Results  Component Value Date   WBC 8.9 01/17/2017   HGB 11.4 (L) 01/17/2017   HCT 35.8 01/17/2017   MCV 85 01/17/2017   PLT 147 01/17/2017     Chemistry      Component Value Date/Time   NA 139 01/17/2017 0936   NA 137 08/08/2016 0948   K 4.2 01/17/2017 0936   K 4.2 08/08/2016 0948   CL 100 01/17/2017 0936   CO2 25 01/17/2017 0936   CO2 23 08/08/2016 0948   BUN 35 (H) 01/17/2017 0936   BUN 40.4 (H) 08/08/2016 0948   CREATININE 1.3 (H) 01/17/2017 0936   CREATININE 1.4 (H) 08/08/2016 0948      Component Value Date/Time   CALCIUM 8.8 01/17/2017 0936   CALCIUM 9.6 08/08/2016 0948   ALKPHOS 86 (H) 01/17/2017 0936   ALKPHOS 99 08/08/2016 0948   AST 21 01/17/2017 0936   AST 11 08/08/2016 0948   ALT 34 01/17/2017 0936   ALT 26  08/08/2016 0948   BILITOT 0.80 01/17/2017 0936   BILITOT 0.52 08/08/2016 0948         Impression and Plan: Brooke Rios is  an 83 year old white female. She has metastatic kidney cancer. This is clear-cell carcinoma. She presented with a solitary CNS met. This was treated with stereotactic radiosurgery. She had lung metastasis. She has the right kidney as a primary.   She really is doing much better than I would've thought. Because of this, I think we are going to have to think about having  this brain metastases resected. Radiation oncology does not have any confidence in stereotactic radiosurgery. I do not want this lesion to be, a problem and then cause her permanent neurological deficits that would adversely affect her quality of life.  I note this is a very difficult decision. There is really no "right or wrong answer". However, I just want her quality of life to be as good as possible. I think that she could get through surgery. She will clearly need to have cardiology involved given her Coumadin and cardiac issues.  She will think about this.  I want to see her back in another month or so.  She got her Xgeva today.  I spent about 25 minutes with she and her daughter. Her other daughter is recovering from surgery.  Volanda Napoleon, MD 3/1/20181:22 PM

## 2017-01-20 ENCOUNTER — Other Ambulatory Visit: Payer: Self-pay | Admitting: Cardiology

## 2017-01-21 ENCOUNTER — Encounter: Payer: Self-pay | Admitting: Hematology & Oncology

## 2017-01-23 ENCOUNTER — Encounter: Payer: Self-pay | Admitting: Hematology & Oncology

## 2017-01-28 ENCOUNTER — Telehealth: Payer: Self-pay | Admitting: Cardiology

## 2017-01-28 NOTE — Telephone Encounter (Signed)
Message sent to [place].

## 2017-01-28 NOTE — Telephone Encounter (Signed)
Request for surgical clearance:  1. What type of surgery is being performed? Craniology (brain tumor)   2. When is this surgery scheduled? Unknown   3. Are there any medications that need to be held prior to surgery and how long? Unknown   4. Name of physician performing surgery? Dr.Nundkumar  5. What is your office phone and fax number? [phone]  Fax  [phone]

## 2017-01-28 NOTE — Telephone Encounter (Signed)
She is cleared for surgery ( craniotomy ) from a cardiac standpoint. She may stop Eliquis 3 days prior.  Tierria Watson Martinique MD, [facility]

## 2017-01-29 NOTE — Telephone Encounter (Signed)
Spoke to patient Dr.Jordan's recommendations given.Note faxed to Dr.Nundkumar at fax # [phone].

## 2017-02-04 ENCOUNTER — Other Ambulatory Visit (HOSPITAL_COMMUNITY): Payer: Self-pay | Admitting: Neurosurgery

## 2017-02-04 ENCOUNTER — Other Ambulatory Visit: Payer: Self-pay | Admitting: Hematology & Oncology

## 2017-02-04 ENCOUNTER — Other Ambulatory Visit: Payer: Self-pay | Admitting: Neurosurgery

## 2017-02-04 DIAGNOSIS — C649 Malignant neoplasm of unspecified kidney, except renal pelvis: Secondary | ICD-10-CM

## 2017-02-04 DIAGNOSIS — I10 Essential (primary) hypertension: Secondary | ICD-10-CM | POA: Diagnosis not present

## 2017-02-04 DIAGNOSIS — I482 Chronic atrial fibrillation, unspecified: Secondary | ICD-10-CM

## 2017-02-04 DIAGNOSIS — C7931 Secondary malignant neoplasm of brain: Secondary | ICD-10-CM | POA: Diagnosis not present

## 2017-02-04 DIAGNOSIS — Z6829 Body mass index (BMI) 29.0-29.9, adult: Secondary | ICD-10-CM | POA: Diagnosis not present

## 2017-02-04 DIAGNOSIS — R410 Disorientation, unspecified: Secondary | ICD-10-CM

## 2017-02-04 DIAGNOSIS — C7801 Secondary malignant neoplasm of right lung: Principal | ICD-10-CM

## 2017-02-06 ENCOUNTER — Ambulatory Visit (HOSPITAL_COMMUNITY)
Admission: RE | Admit: 2017-02-06 | Discharge: 2017-02-06 | Disposition: A | Discharge status: Home / Self Care | Payer: Medicare Other | Source: Ambulatory Visit | Attending: Neurosurgery | Admitting: Neurosurgery

## 2017-02-06 DIAGNOSIS — G936 Cerebral edema: Secondary | ICD-10-CM | POA: Insufficient documentation

## 2017-02-06 DIAGNOSIS — C7931 Secondary malignant neoplasm of brain: Secondary | ICD-10-CM | POA: Diagnosis not present

## 2017-02-06 MED ORDER — IOPAMIDOL (ISOVUE-300) INJECTION 61%
INTRAVENOUS | Status: AC
  Administered 2017-02-06: 50 mL
  Filled 2017-02-06: qty 50

## 2017-02-07 ENCOUNTER — Encounter (HOSPITAL_COMMUNITY): Payer: Self-pay

## 2017-02-07 ENCOUNTER — Encounter (HOSPITAL_COMMUNITY)
Admission: RE | Admit: 2017-02-07 | Discharge: 2017-02-07 | Disposition: A | Discharge status: Home / Self Care | Payer: Medicare Other | Source: Ambulatory Visit | Attending: Neurosurgery | Admitting: Neurosurgery

## 2017-02-07 DIAGNOSIS — Z01818 Encounter for other preprocedural examination: Secondary | ICD-10-CM | POA: Insufficient documentation

## 2017-02-07 DIAGNOSIS — C7931 Secondary malignant neoplasm of brain: Secondary | ICD-10-CM | POA: Diagnosis not present

## 2017-02-07 LAB — CBC
HEMATOCRIT: 34.3 % — AB (ref 36.0–46.0)
HEMOGLOBIN: 10.8 g/dL — AB (ref 12.0–15.0)
MCH: 26.5 pg (ref 26.0–34.0)
MCHC: 31.5 g/dL (ref 30.0–36.0)
MCV: 84.1 fL (ref 78.0–100.0)
Platelets: 198 10*3/uL (ref 150–400)
RBC: 4.08 MIL/uL (ref 3.87–5.11)
RDW: 17.3 % — AB (ref 11.5–15.5)
WBC: 10.7 10*3/uL — ABNORMAL HIGH (ref 4.0–10.5)

## 2017-02-07 LAB — BASIC METABOLIC PANEL
ANION GAP: 12 (ref 5–15)
BUN: 30 mg/dL — ABNORMAL HIGH (ref 6–20)
CALCIUM: 8.5 mg/dL — AB (ref 8.9–10.3)
CO2: 21 mmol/L — AB (ref 22–32)
Chloride: 102 mmol/L (ref 101–111)
Creatinine, Ser: 1.25 mg/dL — ABNORMAL HIGH (ref 0.44–1.00)
GFR calc Af Amer: 43 mL/min — ABNORMAL LOW (ref >60)
GFR, EST NON AFRICAN AMERICAN: 37 mL/min — AB (ref >60)
GLUCOSE: 197 mg/dL — AB (ref 65–99)
Potassium: 4.6 mmol/L (ref 3.5–5.1)
Sodium: 135 mmol/L (ref 135–145)

## 2017-02-07 MED ORDER — CHLORHEXIDINE GLUCONATE CLOTH 2 % EX PADS: 6.0000 | MEDICATED_PAD | Freq: Once | CUTANEOUS | Status: DC

## 2017-02-07 NOTE — Progress Notes (Addendum)
PCP: Silvestre Mesi, MD  Cardiologist: Peter Martinique, MD  EKG: 05/2016 in [facility]  Stress test: pt denies  ECHO: pt denies  Cardiac Cath:pt denies  Chest x-ray: 09/2016 in [facility]

## 2017-02-07 NOTE — Pre-Procedure Instructions (Signed)
    Brooke Rios  02/07/2017      [id number] [address] [place] [address]Phone: [phone]: [phone]   Your procedure is scheduled on Tues. Mar. 27 '@245'$  pm.  Report to [facility] 1245 PM.  Call this number if you have problems the morning of surgery:[phone]   Remember:  Do not eat food or drink liquids after midnight.  Take these medicines the morning of surgery with A SIP OF WATER metoprolol (toprol-XL), dexamethasone (decadron), acetaminophen (tylenol)-if needed, docusate sodium (colace), polyvinyl alcohol (liquifilm tears) eye drops, pantoprazole (protonix), tramadol (ultram).  Stop taking any Vitamins or Herbal Medications. No Aspirin, Goody's, BC's, Aleve, Advil, Motrin, Ibuprofen or Fish oil.   Do not wear jewelry, make-up or nail polish.  Do not wear lotions, powders, or perfumes, or deoderant.  Do not shave 48 hours prior to surgery.    Do not bring valuables to the hospital.  [facility] or [facility].  Contacts, dentures or bridgework may not be worn into surgery.  Leave your suitcase in the car.  After surgery it may be brought to your room.  For patients admitted to the hospital, discharge time will be determined by your treatment team.  Patients discharged the day of surgery will not be allowed to drive home.    Special instructions:  See attached  Please read over the following fact sheets that you were given. Pain Booklet, Coughing and Deep Breathing and Surgical Site Infection Prevention
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
## 2017-02-09 ENCOUNTER — Encounter: Payer: Self-pay | Admitting: Hematology & Oncology

## 2017-02-11 MED ORDER — SODIUM CHLORIDE 0.9 % IV SOLN
INTRAVENOUS | Status: DC
  Administered 2017-02-12 (×2): via INTRAVENOUS

## 2017-02-11 MED ORDER — VANCOMYCIN HCL IN DEXTROSE 1-5 GM/200ML-% IV SOLN
1000.0000 mg | INTRAVENOUS | Status: AC
  Administered 2017-02-12: 1000 mg via INTRAVENOUS
  Filled 2017-02-11: qty 200

## 2017-02-12 ENCOUNTER — Inpatient Hospital Stay (HOSPITAL_COMMUNITY)
Admission: RE | Admit: 2017-02-12 | Discharge: 2017-02-15 | DRG: 026 | Disposition: A | Discharge status: Home Health | Payer: Medicare Other | Source: Ambulatory Visit | Attending: Neurosurgery | Admitting: Neurosurgery

## 2017-02-12 ENCOUNTER — Inpatient Hospital Stay (HOSPITAL_COMMUNITY): Payer: Medicare Other | Admitting: Vascular Surgery

## 2017-02-12 ENCOUNTER — Encounter (HOSPITAL_COMMUNITY)
Admission: RE | Disposition: A | Discharge status: Home Health | Payer: Self-pay | Source: Ambulatory Visit | Attending: Neurosurgery

## 2017-02-12 ENCOUNTER — Encounter (HOSPITAL_COMMUNITY): Payer: Self-pay

## 2017-02-12 DIAGNOSIS — C719 Malignant neoplasm of brain, unspecified: Secondary | ICD-10-CM | POA: Diagnosis not present

## 2017-02-12 DIAGNOSIS — I4891 Unspecified atrial fibrillation: Secondary | ICD-10-CM | POA: Diagnosis not present

## 2017-02-12 DIAGNOSIS — C7931 Secondary malignant neoplasm of brain: Secondary | ICD-10-CM | POA: Diagnosis not present

## 2017-02-12 DIAGNOSIS — D496 Neoplasm of unspecified behavior of brain: Secondary | ICD-10-CM | POA: Diagnosis present

## 2017-02-12 DIAGNOSIS — I2510 Atherosclerotic heart disease of native coronary artery without angina pectoris: Secondary | ICD-10-CM | POA: Diagnosis present

## 2017-02-12 DIAGNOSIS — Z9049 Acquired absence of other specified parts of digestive tract: Secondary | ICD-10-CM | POA: Diagnosis not present

## 2017-02-12 DIAGNOSIS — Z950 Presence of cardiac pacemaker: Secondary | ICD-10-CM | POA: Diagnosis not present

## 2017-02-12 DIAGNOSIS — C649 Malignant neoplasm of unspecified kidney, except renal pelvis: Secondary | ICD-10-CM | POA: Diagnosis present

## 2017-02-12 DIAGNOSIS — Z85528 Personal history of other malignant neoplasm of kidney: Secondary | ICD-10-CM

## 2017-02-12 DIAGNOSIS — C7800 Secondary malignant neoplasm of unspecified lung: Secondary | ICD-10-CM | POA: Diagnosis present

## 2017-02-12 DIAGNOSIS — Z923 Personal history of irradiation: Secondary | ICD-10-CM | POA: Diagnosis not present

## 2017-02-12 DIAGNOSIS — I129 Hypertensive chronic kidney disease with stage 1 through stage 4 chronic kidney disease, or unspecified chronic kidney disease: Secondary | ICD-10-CM | POA: Diagnosis present

## 2017-02-12 DIAGNOSIS — Z79899 Other long term (current) drug therapy: Secondary | ICD-10-CM | POA: Diagnosis not present

## 2017-02-12 DIAGNOSIS — I495 Sick sinus syndrome: Secondary | ICD-10-CM | POA: Diagnosis present

## 2017-02-12 DIAGNOSIS — Z888 Allergy status to other drugs, medicaments and biological substances status: Secondary | ICD-10-CM

## 2017-02-12 DIAGNOSIS — C7951 Secondary malignant neoplasm of bone: Secondary | ICD-10-CM | POA: Diagnosis present

## 2017-02-12 DIAGNOSIS — I252 Old myocardial infarction: Secondary | ICD-10-CM | POA: Diagnosis not present

## 2017-02-12 DIAGNOSIS — K219 Gastro-esophageal reflux disease without esophagitis: Secondary | ICD-10-CM | POA: Diagnosis present

## 2017-02-12 DIAGNOSIS — Z885 Allergy status to narcotic agent status: Secondary | ICD-10-CM

## 2017-02-12 DIAGNOSIS — I6789 Other cerebrovascular disease: Secondary | ICD-10-CM | POA: Diagnosis not present

## 2017-02-12 DIAGNOSIS — Z86010 Personal history of colonic polyps: Secondary | ICD-10-CM | POA: Diagnosis not present

## 2017-02-12 DIAGNOSIS — N189 Chronic kidney disease, unspecified: Secondary | ICD-10-CM | POA: Diagnosis present

## 2017-02-12 DIAGNOSIS — E785 Hyperlipidemia, unspecified: Secondary | ICD-10-CM | POA: Diagnosis present

## 2017-02-12 DIAGNOSIS — Z79891 Long term (current) use of opiate analgesic: Secondary | ICD-10-CM | POA: Diagnosis not present

## 2017-02-12 DIAGNOSIS — Z7901 Long term (current) use of anticoagulants: Secondary | ICD-10-CM

## 2017-02-12 DIAGNOSIS — Z90710 Acquired absence of both cervix and uterus: Secondary | ICD-10-CM | POA: Diagnosis not present

## 2017-02-12 DIAGNOSIS — R5383 Other fatigue: Secondary | ICD-10-CM | POA: Diagnosis not present

## 2017-02-12 DIAGNOSIS — M1990 Unspecified osteoarthritis, unspecified site: Secondary | ICD-10-CM | POA: Diagnosis not present

## 2017-02-12 DIAGNOSIS — Z862 Personal history of diseases of the blood and blood-forming organs and certain disorders involving the immune mechanism: Secondary | ICD-10-CM

## 2017-02-12 DIAGNOSIS — R600 Localized edema: Secondary | ICD-10-CM | POA: Diagnosis not present

## 2017-02-12 HISTORY — PX: APPLICATION OF CRANIAL NAVIGATION: SHX6578

## 2017-02-12 HISTORY — PX: CRANIOTOMY: SHX93

## 2017-02-12 LAB — TYPE AND SCREEN
ABO/RH(D): A POS
Antibody Screen: NEGATIVE

## 2017-02-12 LAB — MRSA PCR SCREENING: MRSA by PCR: NEGATIVE

## 2017-02-12 LAB — GLUCOSE, CAPILLARY: GLUCOSE-CAPILLARY: 116 mg/dL — AB (ref 65–99)

## 2017-02-12 SURGERY — CRANIOTOMY TUMOR EXCISION
Anesthesia: General | Laterality: Right

## 2017-02-12 MED ORDER — NALOXONE HCL 0.4 MG/ML IJ SOLN: 0.0800 mg | INTRAMUSCULAR | Status: DC | PRN

## 2017-02-12 MED ORDER — LIDOCAINE-EPINEPHRINE 2 %-1:100000 IJ SOLN
INTRAMUSCULAR | Status: AC
  Filled 2017-02-12: qty 1

## 2017-02-12 MED ORDER — SODIUM CHLORIDE 0.9 % IV SOLN
0.0500 ug/kg/min | INTRAVENOUS | Status: AC
  Administered 2017-02-12: .2 ug/kg/min via INTRAVENOUS
  Filled 2017-02-12: qty 5000

## 2017-02-12 MED ORDER — DEXAMETHASONE SODIUM PHOSPHATE 4 MG/ML IJ SOLN: 4.0000 mg | Freq: Three times a day (TID) | INTRAMUSCULAR | Status: DC

## 2017-02-12 MED ORDER — PHENYLEPHRINE HCL 10 MG/ML IJ SOLN
INTRAMUSCULAR | Status: DC | PRN
  Administered 2017-02-12: 20 ug/min via INTRAVENOUS

## 2017-02-12 MED ORDER — PROMETHAZINE HCL 25 MG PO TABS: 12.5000 mg | ORAL_TABLET | ORAL | Status: DC | PRN

## 2017-02-12 MED ORDER — SUGAMMADEX SODIUM 200 MG/2ML IV SOLN
INTRAVENOUS | Status: DC | PRN
  Administered 2017-02-12: 200 mg via INTRAVENOUS

## 2017-02-12 MED ORDER — DEXAMETHASONE SODIUM PHOSPHATE 10 MG/ML IJ SOLN
6.0000 mg | Freq: Four times a day (QID) | INTRAMUSCULAR | Status: DC
  Administered 2017-02-13 (×2): 6 mg via INTRAVENOUS
  Filled 2017-02-12 (×2): qty 1

## 2017-02-12 MED ORDER — ROCURONIUM BROMIDE 100 MG/10ML IV SOLN
INTRAVENOUS | Status: DC | PRN
  Administered 2017-02-12: 50 mg via INTRAVENOUS

## 2017-02-12 MED ORDER — THROMBIN 20000 UNITS EX SOLR
CUTANEOUS | Status: AC
  Filled 2017-02-12: qty 20000

## 2017-02-12 MED ORDER — SODIUM CHLORIDE 0.9 % IV SOLN: INTRAVENOUS | Status: DC

## 2017-02-12 MED ORDER — ONDANSETRON HCL 4 MG/2ML IJ SOLN
INTRAMUSCULAR | Status: AC
  Filled 2017-02-12: qty 2

## 2017-02-12 MED ORDER — PHENYLEPHRINE HCL 10 MG/ML IJ SOLN
INTRAMUSCULAR | Status: DC | PRN
  Administered 2017-02-12: 80 ug via INTRAVENOUS

## 2017-02-12 MED ORDER — BUPIVACAINE HCL (PF) 0.5 % IJ SOLN
INTRAMUSCULAR | Status: DC | PRN
  Administered 2017-02-12: 3.5 mL

## 2017-02-12 MED ORDER — SUCCINYLCHOLINE CHLORIDE 200 MG/10ML IV SOSY
PREFILLED_SYRINGE | INTRAVENOUS | Status: DC | PRN
  Administered 2017-02-12: 100 mg via INTRAVENOUS

## 2017-02-12 MED ORDER — ARTIFICIAL TEARS OP OINT
TOPICAL_OINTMENT | OPHTHALMIC | Status: AC
  Filled 2017-02-12: qty 3.5

## 2017-02-12 MED ORDER — PHENYLEPHRINE 40 MCG/ML (10ML) SYRINGE FOR IV PUSH (FOR BLOOD PRESSURE SUPPORT)
PREFILLED_SYRINGE | INTRAVENOUS | Status: AC
  Filled 2017-02-12: qty 20

## 2017-02-12 MED ORDER — BACITRACIN ZINC 500 UNIT/GM EX OINT
TOPICAL_OINTMENT | CUTANEOUS | Status: AC
  Filled 2017-02-12: qty 28.35

## 2017-02-12 MED ORDER — DEXAMETHASONE SODIUM PHOSPHATE 10 MG/ML IJ SOLN
INTRAMUSCULAR | Status: DC | PRN
  Administered 2017-02-12: 10 mg via INTRAVENOUS

## 2017-02-12 MED ORDER — BISACODYL 10 MG RE SUPP: 10.0000 mg | Freq: Every day | RECTAL | Status: DC | PRN

## 2017-02-12 MED ORDER — ESMOLOL HCL 100 MG/10ML IV SOLN
INTRAVENOUS | Status: DC | PRN
  Administered 2017-02-12: 50 mg via INTRAVENOUS
  Administered 2017-02-12: 40 mg via INTRAVENOUS
  Administered 2017-02-12: 50 mg via INTRAVENOUS
  Administered 2017-02-12 (×2): 30 mg via INTRAVENOUS

## 2017-02-12 MED ORDER — ESMOLOL HCL 100 MG/10ML IV SOLN
INTRAVENOUS | Status: AC
  Filled 2017-02-12: qty 10

## 2017-02-12 MED ORDER — THROMBIN 5000 UNITS EX SOLR
CUTANEOUS | Status: AC
  Filled 2017-02-12: qty 5000

## 2017-02-12 MED ORDER — BACITRACIN ZINC 500 UNIT/GM EX OINT
TOPICAL_OINTMENT | CUTANEOUS | Status: DC | PRN
  Administered 2017-02-12: 1 via TOPICAL

## 2017-02-12 MED ORDER — ONDANSETRON HCL 4 MG PO TABS: 4.0000 mg | ORAL_TABLET | ORAL | Status: DC | PRN

## 2017-02-12 MED ORDER — SURGIFOAM 100 EX MISC
CUTANEOUS | Status: DC | PRN
  Administered 2017-02-12: 17:00:00 via TOPICAL

## 2017-02-12 MED ORDER — VECURONIUM BROMIDE 10 MG IV SOLR
INTRAVENOUS | Status: AC
  Filled 2017-02-12: qty 10

## 2017-02-12 MED ORDER — PANTOPRAZOLE SODIUM 40 MG IV SOLR
40.0000 mg | Freq: Every day | INTRAVENOUS | Status: DC
  Administered 2017-02-12 – 2017-02-14 (×3): 40 mg via INTRAVENOUS
  Filled 2017-02-12 (×3): qty 40

## 2017-02-12 MED ORDER — DEXAMETHASONE SODIUM PHOSPHATE 4 MG/ML IJ SOLN: 4.0000 mg | Freq: Four times a day (QID) | INTRAMUSCULAR | Status: DC

## 2017-02-12 MED ORDER — HYDROCODONE-ACETAMINOPHEN 5-325 MG PO TABS
1.0000 | ORAL_TABLET | ORAL | Status: DC | PRN
Start: 2017-02-12 — End: 2017-02-15

## 2017-02-12 MED ORDER — LIDOCAINE HCL (CARDIAC) 20 MG/ML IV SOLN
INTRAVENOUS | Status: DC | PRN
  Administered 2017-02-12: 60 mg via INTRAVENOUS

## 2017-02-12 MED ORDER — VECURONIUM BROMIDE 10 MG IV SOLR
INTRAVENOUS | Status: DC | PRN
  Administered 2017-02-12: 1 mg via INTRAVENOUS
  Administered 2017-02-12: 3 mg via INTRAVENOUS
  Administered 2017-02-12: 2 mg via INTRAVENOUS

## 2017-02-12 MED ORDER — DOCUSATE SODIUM 100 MG PO CAPS
100.0000 mg | ORAL_CAPSULE | Freq: Two times a day (BID) | ORAL | Status: DC
  Administered 2017-02-12 – 2017-02-15 (×4): 100 mg via ORAL
  Filled 2017-02-12 (×6): qty 1

## 2017-02-12 MED ORDER — SENNOSIDES-DOCUSATE SODIUM 8.6-50 MG PO TABS: 1.0000 | ORAL_TABLET | Freq: Every evening | ORAL | Status: DC | PRN

## 2017-02-12 MED ORDER — MANNITOL 25 % IV SOLN
25.0000 g | INTRAVENOUS | Status: DC
  Filled 2017-02-12: qty 100

## 2017-02-12 MED ORDER — VANCOMYCIN HCL IN DEXTROSE 1-5 GM/200ML-% IV SOLN
1000.0000 mg | Freq: Once | INTRAVENOUS | Status: AC
  Administered 2017-02-13: 1000 mg via INTRAVENOUS
  Filled 2017-02-12: qty 200

## 2017-02-12 MED ORDER — ONDANSETRON HCL 4 MG/2ML IJ SOLN: 4.0000 mg | INTRAMUSCULAR | Status: DC | PRN

## 2017-02-12 MED ORDER — LABETALOL HCL 5 MG/ML IV SOLN: 10.0000 mg | INTRAVENOUS | Status: DC | PRN

## 2017-02-12 MED ORDER — ARTIFICIAL TEARS OP OINT
TOPICAL_OINTMENT | OPHTHALMIC | Status: DC | PRN
  Administered 2017-02-12: 1 via OPHTHALMIC

## 2017-02-12 MED ORDER — HEMOSTATIC AGENTS (NO CHARGE) OPTIME
TOPICAL | Status: DC | PRN
  Administered 2017-02-12: 1 via TOPICAL

## 2017-02-12 MED ORDER — ACETAMINOPHEN 650 MG RE SUPP: 650.0000 mg | RECTAL | Status: DC | PRN

## 2017-02-12 MED ORDER — SODIUM CHLORIDE 0.9 % IV SOLN
1000.0000 mg | INTRAVENOUS | Status: AC
  Administered 2017-02-12: 1000 mg via INTRAVENOUS
  Filled 2017-02-12: qty 10

## 2017-02-12 MED ORDER — ONDANSETRON HCL 4 MG/2ML IJ SOLN
INTRAMUSCULAR | Status: DC | PRN
  Administered 2017-02-12: 4 mg via INTRAVENOUS

## 2017-02-12 MED ORDER — FENTANYL CITRATE (PF) 100 MCG/2ML IJ SOLN
INTRAMUSCULAR | Status: DC | PRN
  Administered 2017-02-12 (×2): 50 ug via INTRAVENOUS

## 2017-02-12 MED ORDER — SUGAMMADEX SODIUM 200 MG/2ML IV SOLN
INTRAVENOUS | Status: AC
  Filled 2017-02-12: qty 2

## 2017-02-12 MED ORDER — 0.9 % SODIUM CHLORIDE (POUR BTL) OPTIME
TOPICAL | Status: DC | PRN
  Administered 2017-02-12: 1000 mL
  Administered 2017-02-12: 2000 mL

## 2017-02-12 MED ORDER — PHENYLEPHRINE 40 MCG/ML (10ML) SYRINGE FOR IV PUSH (FOR BLOOD PRESSURE SUPPORT)
PREFILLED_SYRINGE | INTRAVENOUS | Status: AC
  Filled 2017-02-12: qty 10

## 2017-02-12 MED ORDER — THROMBIN 5000 UNITS EX SOLR
OROMUCOSAL | Status: DC | PRN
  Administered 2017-02-12: 17:00:00 via TOPICAL

## 2017-02-12 MED ORDER — LABETALOL HCL 5 MG/ML IV SOLN
INTRAVENOUS | Status: DC | PRN
  Administered 2017-02-12 (×2): 10 mg via INTRAVENOUS

## 2017-02-12 MED ORDER — FENTANYL CITRATE (PF) 100 MCG/2ML IJ SOLN: 25.0000 ug | INTRAMUSCULAR | Status: DC | PRN

## 2017-02-12 MED ORDER — SODIUM CHLORIDE 0.9 % IV SOLN
500.0000 mg | Freq: Two times a day (BID) | INTRAVENOUS | Status: DC
  Administered 2017-02-12 – 2017-02-15 (×6): 500 mg via INTRAVENOUS
  Filled 2017-02-12 (×7): qty 5

## 2017-02-12 MED ORDER — HYDROMORPHONE HCL 1 MG/ML IJ SOLN: 0.5000 mg | INTRAMUSCULAR | Status: DC | PRN

## 2017-02-12 MED ORDER — FLEET ENEMA 7-19 GM/118ML RE ENEM: 1.0000 | ENEMA | Freq: Once | RECTAL | Status: DC | PRN

## 2017-02-12 MED ORDER — PROPOFOL 10 MG/ML IV BOLUS
INTRAVENOUS | Status: DC | PRN
  Administered 2017-02-12: 40 mg via INTRAVENOUS
  Administered 2017-02-12: 100 mg via INTRAVENOUS

## 2017-02-12 MED ORDER — ACETAMINOPHEN 325 MG PO TABS
650.0000 mg | ORAL_TABLET | ORAL | Status: DC | PRN
  Administered 2017-02-13: 650 mg via ORAL
  Filled 2017-02-12 (×2): qty 2

## 2017-02-12 MED ORDER — FENTANYL CITRATE (PF) 100 MCG/2ML IJ SOLN
INTRAMUSCULAR | Status: AC
  Filled 2017-02-12: qty 4

## 2017-02-12 MED ORDER — SODIUM CHLORIDE 0.9 % IV SOLN
INTRAVENOUS | Status: DC | PRN
  Administered 2017-02-12: 14:00:00 via INTRAVENOUS

## 2017-02-12 MED ORDER — PHENYLEPHRINE 40 MCG/ML (10ML) SYRINGE FOR IV PUSH (FOR BLOOD PRESSURE SUPPORT)
PREFILLED_SYRINGE | INTRAVENOUS | Status: DC | PRN
Start: 2017-02-12 — End: 2017-02-12
  Administered 2017-02-12: 60 ug via INTRAVENOUS
  Administered 2017-02-12: 80 ug via INTRAVENOUS

## 2017-02-12 MED ORDER — LIDOCAINE-EPINEPHRINE 2 %-1:100000 IJ SOLN
INTRAMUSCULAR | Status: DC | PRN
  Administered 2017-02-12: 3.5 mL via INTRADERMAL

## 2017-02-12 SURGICAL SUPPLY — 108 items
BANDAGE GAUZE 4  KLING STR (GAUZE/BANDAGES/DRESSINGS) IMPLANT
BATTERY IQ STERILE (MISCELLANEOUS) ×3 IMPLANT
BENZOIN TINCTURE PRP APPL 2/3 (GAUZE/BANDAGES/DRESSINGS) IMPLANT
BLADE 11 SAFETY STRL DISP (BLADE) ×3 IMPLANT
BLADE CLIPPER SURG (BLADE) ×3 IMPLANT
BLADE SAW GIGLI 16 STRL (MISCELLANEOUS) IMPLANT
BLADE SURG 15 STRL LF DISP TIS (BLADE) IMPLANT
BLADE SURG 15 STRL SS (BLADE)
BLADE ULTRA TIP 2M (BLADE) ×3 IMPLANT
BNDG GAUZE ELAST 4 BULKY (GAUZE/BANDAGES/DRESSINGS) ×6 IMPLANT
BNDG GAUZE STRTCH 6 (GAUZE/BANDAGES/DRESSINGS) ×3 IMPLANT
BUR ACORN 6.0 PRECISION (BURR) ×2 IMPLANT
BUR ACORN 6.0MM PRECISION (BURR) ×1
BUR ROUND FLUTED 4 SOFT TCH (BURR) IMPLANT
BUR ROUND FLUTED 4MM SOFT TCH (BURR)
BUR SPIRAL ROUTER 2.3 (BUR) ×2 IMPLANT
BUR SPIRAL ROUTER 2.3MM (BUR) ×1
CANISTER SUCT 3000ML PPV (MISCELLANEOUS) ×6 IMPLANT
CARTRIDGE OIL MAESTRO DRILL (MISCELLANEOUS) ×2 IMPLANT
CATH VENTRIC 35X38 W/TROCAR LG (CATHETERS) IMPLANT
CLIP TI MEDIUM 6 (CLIP) IMPLANT
CONT SPEC 4OZ CLIKSEAL STRL BL (MISCELLANEOUS) ×6 IMPLANT
COVER MAYO STAND STRL (DRAPES) IMPLANT
CUSA CURVED EXTENDED TIP ×2 IMPLANT
CUSA QUICK CONNECT ×2 IMPLANT
CUSA STANDARD TIP ×2 IMPLANT
DECANTER SPIKE VIAL GLASS SM (MISCELLANEOUS) ×3 IMPLANT
DIFFUSER DRILL AIR PNEUMATIC (MISCELLANEOUS) ×6 IMPLANT
DRAIN SUBARACHNOID (WOUND CARE) IMPLANT
DRAPE HALF SHEET 40X57 (DRAPES) ×3 IMPLANT
DRAPE MICROSCOPE LEICA (MISCELLANEOUS) ×3 IMPLANT
DRAPE NEUROLOGICAL W/INCISE (DRAPES) ×3 IMPLANT
DRAPE STERI IOBAN 125X83 (DRAPES) IMPLANT
DRAPE SURG 17X23 STRL (DRAPES) IMPLANT
DRAPE WARM FLUID 44X44 (DRAPE) ×3 IMPLANT
DRSG ADAPTIC 3X8 NADH LF (GAUZE/BANDAGES/DRESSINGS) IMPLANT
DRSG TELFA 3X8 NADH (GAUZE/BANDAGES/DRESSINGS) ×3 IMPLANT
DURAMATRIX ONLAY 3X3 (Plate) ×3 IMPLANT
DURAPREP 6ML APPLICATOR 50/CS (WOUND CARE) ×3 IMPLANT
ELECT REM PT RETURN 9FT ADLT (ELECTROSURGICAL) ×3
ELECTRODE REM PT RTRN 9FT ADLT (ELECTROSURGICAL) ×1 IMPLANT
EVACUATOR 1/8 PVC DRAIN (DRAIN) IMPLANT
EVACUATOR SILICONE 100CC (DRAIN) IMPLANT
FORCEPS BIPOLAR SPETZLER 8 1.0 (NEUROSURGERY SUPPLIES) ×3 IMPLANT
GAUZE SPONGE 4X4 12PLY STRL (GAUZE/BANDAGES/DRESSINGS) ×3 IMPLANT
GAUZE SPONGE 4X4 16PLY XRAY LF (GAUZE/BANDAGES/DRESSINGS) IMPLANT
GLOVE BIO SURGEON STRL SZ7 (GLOVE) IMPLANT
GLOVE BIOGEL PI IND STRL 7.0 (GLOVE) IMPLANT
GLOVE BIOGEL PI IND STRL 7.5 (GLOVE) ×1 IMPLANT
GLOVE BIOGEL PI INDICATOR 7.0 (GLOVE)
GLOVE BIOGEL PI INDICATOR 7.5 (GLOVE) ×2
GLOVE ECLIPSE 6.5 STRL STRAW (GLOVE) ×3 IMPLANT
GLOVE ECLIPSE 7.0 STRL STRAW (GLOVE) ×3 IMPLANT
GLOVE EXAM NITRILE LRG STRL (GLOVE) IMPLANT
GLOVE EXAM NITRILE XL STR (GLOVE) IMPLANT
GLOVE EXAM NITRILE XS STR PU (GLOVE) IMPLANT
GOWN STRL REUS W/ TWL LRG LVL3 (GOWN DISPOSABLE) ×2 IMPLANT
GOWN STRL REUS W/ TWL XL LVL3 (GOWN DISPOSABLE) IMPLANT
GOWN STRL REUS W/TWL 2XL LVL3 (GOWN DISPOSABLE) IMPLANT
GOWN STRL REUS W/TWL LRG LVL3 (GOWN DISPOSABLE) ×4
GOWN STRL REUS W/TWL XL LVL3 (GOWN DISPOSABLE)
HEMOSTAT POWDER KIT SURGIFOAM (HEMOSTASIS) ×3 IMPLANT
HEMOSTAT SURGICEL 2X14 (HEMOSTASIS) IMPLANT
HOOK DURA 1/2IN (MISCELLANEOUS) ×3 IMPLANT
INTEGRA CUSA CLARITY ×2 IMPLANT
IV NS 1000ML (IV SOLUTION) ×2
IV NS 1000ML BAXH (IV SOLUTION) ×1 IMPLANT
KIT BASIN OR (CUSTOM PROCEDURE TRAY) ×3 IMPLANT
KIT DRAIN CSF ACCUDRAIN (MISCELLANEOUS) IMPLANT
KIT ROOM TURNOVER OR (KITS) ×3 IMPLANT
KNIFE ARACHNOID DISP AM-24-S (MISCELLANEOUS) IMPLANT
MARKER SPHERE PSV REFLC 13MM (MARKER) ×6 IMPLANT
NEEDLE HYPO 25X1 1.5 SAFETY (NEEDLE) ×6 IMPLANT
NEEDLE SPNL 18GX3.5 QUINCKE PK (NEEDLE) IMPLANT
NS IRRIG 1000ML POUR BTL (IV SOLUTION) ×9 IMPLANT
OIL CARTRIDGE MAESTRO DRILL (MISCELLANEOUS) ×6
PACK CRANIOTOMY (CUSTOM PROCEDURE TRAY) ×3 IMPLANT
PATTIES SURGICAL .25X.25 (GAUZE/BANDAGES/DRESSINGS) IMPLANT
PATTIES SURGICAL .5 X.5 (GAUZE/BANDAGES/DRESSINGS) ×3 IMPLANT
PATTIES SURGICAL .5 X3 (DISPOSABLE) IMPLANT
PATTIES SURGICAL 1/4 X 3 (GAUZE/BANDAGES/DRESSINGS) IMPLANT
PATTIES SURGICAL 1X1 (DISPOSABLE) IMPLANT
PIN MAYFIELD SKULL DISP (PIN) ×3 IMPLANT
PLATE 1.5/0.5 18.5MM BURR HOLE (Plate) ×12 IMPLANT
RUBBERBAND STERILE (MISCELLANEOUS) ×6 IMPLANT
SCREW SELF DRILL HT 1.5/4MM (Screw) ×42 IMPLANT
SET TUBING W/EXT DISP (INSTRUMENTS) ×3 IMPLANT
SPECIMEN JAR SMALL (MISCELLANEOUS) IMPLANT
SPONGE NEURO XRAY DETECT 1X3 (DISPOSABLE) IMPLANT
SPONGE SURGIFOAM ABS GEL 100 (HEMOSTASIS) ×3 IMPLANT
STAPLER VISISTAT 35W (STAPLE) ×3 IMPLANT
STOCKINETTE 6  STRL (DRAPES) ×2
STOCKINETTE 6 STRL (DRAPES) ×1 IMPLANT
SUT ETHILON 3 0 FSL (SUTURE) IMPLANT
SUT ETHILON 3 0 PS 1 (SUTURE) IMPLANT
SUT NURALON 4 0 TR CR/8 (SUTURE) IMPLANT
SUT SILK 0 TIES 10X30 (SUTURE) IMPLANT
SUT VIC AB 0 CT1 18XCR BRD8 (SUTURE) ×1 IMPLANT
SUT VIC AB 0 CT1 8-18 (SUTURE) ×2
SUT VIC AB 3-0 SH 8-18 (SUTURE) ×9 IMPLANT
TIP STRAIGHT 25KHZ (INSTRUMENTS) ×3 IMPLANT
TOWEL GREEN STERILE (TOWEL DISPOSABLE) ×3 IMPLANT
TOWEL GREEN STERILE FF (TOWEL DISPOSABLE) ×2 IMPLANT
TRAY FOLEY W/METER SILVER 16FR (SET/KITS/TRAYS/PACK) ×3 IMPLANT
TUBE CONNECTING 12'X1/4 (SUCTIONS) ×1
TUBE CONNECTING 12X1/4 (SUCTIONS) ×2 IMPLANT
UNDERPAD 30X30 (UNDERPADS AND DIAPERS) ×3 IMPLANT
WATER STERILE IRR 1000ML POUR (IV SOLUTION) ×3 IMPLANT

## 2017-02-12 NOTE — Anesthesia Preprocedure Evaluation (Addendum)
Anesthesia Evaluation  Patient identified by MRN, date of birth, ID band Patient awake    Reviewed: Allergy & Precautions, NPO status , Patient's Chart, lab work & pertinent test results, reviewed documented beta blocker date and time   Airway Mallampati: IV  TM Distance: <3 FB Neck ROM: Full    Dental  (+) Poor Dentition   Pulmonary neg pulmonary ROS,    breath sounds clear to auscultation       Cardiovascular hypertension, Pt. on home beta blockers + CAD and + Past MI  + dysrhythmias Atrial Fibrillation + pacemaker  Rhythm:Regular Rate:Normal     Neuro/Psych negative neurological ROS  negative psych ROS   GI/Hepatic Neg liver ROS, GERD  Medicated,  Endo/Other  negative endocrine ROS  Renal/GU CRFRenal disease  negative genitourinary   Musculoskeletal  (+) Arthritis , Osteoarthritis,    Abdominal   Peds negative pediatric ROS (+)  Hematology   Anesthesia Other Findings - HLD - Sick Sinus Syndrome  Reproductive/Obstetrics negative OB ROS                            Lab Results  Component Value Date   WBC 10.7 (H) 02/07/2017   HGB 10.8 (L) 02/07/2017   HCT 34.3 (L) 02/07/2017   MCV 84.1 02/07/2017   PLT 198 02/07/2017   Lab Results  Component Value Date   CREATININE 1.25 (H) 02/07/2017   BUN 30 (H) 02/07/2017   NA 135 02/07/2017   K 4.6 02/07/2017   CL 102 02/07/2017   CO2 21 (L) 02/07/2017   Lab Results  Component Value Date   INR 1.07 10/29/2016   INR 1.08 06/19/2016   INR 1.32 06/04/2016   EKG: normal sinus rhythm.  Anesthesia Physical Anesthesia Plan  ASA: III  Anesthesia Plan: General   Post-op Pain Management:    Induction: Intravenous  Airway Management Planned: Oral ETT  Additional Equipment: Arterial line  Intra-op Plan:   Post-operative Plan: Possible Post-op intubation/ventilation  Informed Consent: I have reviewed the patients History and  Physical, chart, labs and discussed the procedure including the risks, benefits and alternatives for the proposed anesthesia with the patient or authorized representative who has indicated his/her understanding and acceptance.   Dental advisory given  Plan Discussed with: CRNA  Anesthesia Plan Comments:         Anesthesia Quick Evaluation

## 2017-02-12 NOTE — H&P (Signed)
CC:  Brain tumor  HPI: Brooke Rios is an 83 year old woman I am seeing in follow-up. She has a history of clear cell renal carcinoma, with a small right frontal metastasis treated with stereotactic radiosurgery back in August of 2017. She suffered a fall a few months after that, which demonstrated improvement in the lesion. Unfortunately, she underwent repeat CT scan in January of this year when she became confused. She was ultimately diagnosed with a urinary tract infection as the cause of her confusion, but the CT scan did demonstrate significant enlargement in the treated lesion suggesting treatment failure. Her case was discussed with her oncologist, Dr. Marin Olp, and was also discussed at the multidisciplinary Neuro-Oncology conference. Given the treatment failure, surgical resection was felt to be a reasonable option.    Upon questioning, she states that she does not really have any significant symptoms. She denies any real headaches, numbness tingling or weakness. She does have some blurry vision although this has been an issue for quite some time. She is somewhat unsteady on her feet. She has been living with her daughter who helps her at home.   PMH: Past Medical History:  Diagnosis Date  . Chronic kidney disease 2011   creat 1.5   . Colonic polyp   . Coronary artery disease    Moderate three vessel obstructive coronary disease  . Diverticulosis of colon   . E. coli UTI 02/2013   uniformly sensitive  . Goals of care, counseling/discussion 12/26/2016  . Gout   . History of radiation therapy 07/09/2016   SRS treatment to Right frontal lobe brain metastatis  . Hyperlipidemia   . Hypertension   . Metastatic renal cell carcinoma to bone ([place]) 12/26/2016  . Metastatic renal cell carcinoma to brain ([place]) 07/09/2016  . Metastatic renal cell carcinoma to lung ([place]) 07/09/2016  . Myocardial infarction 2012  . Osteoarthritis   . Sick sinus syndrome ([facility])    with paroxysmal atrial fibrillation   . Vertigo     PSH: Past Surgical History:  Procedure Laterality Date  . ABDOMINAL HYSTERECTOMY    . APPENDECTOMY     done at [facility]  . BREAST BIOPSY Left    x 1  . BREAST LUMPECTOMY Left 2009   Dr Margot Chimes  . CHOLECYSTECTOMY     age 37  . CHOLECYSTECTOMY    . COLONOSCOPY    . IR GENERIC HISTORICAL  10/29/2016   IR FLUORO GUIDE PORT INSERTION RIGHT 10/29/2016 Greggory Keen, MD [facility]  . IR GENERIC HISTORICAL  10/29/2016   IR US GUIDE VASC ACCESS RIGHT 10/29/2016 Greggory Keen, MD [facility]  . OOPHORECTOMY     bilat for fibroids @ age 36, anemia pre op due to dysfunctional menses yo  . PACEMAKER INSERTION  2011  . PAF induced MI, s/p pacer for Brady-tach syndrome  04/2010   Dr.jordan  . TONSILLECTOMY      SH: Social History  Substance Use Topics  . Smoking status: Never Smoker  . Smokeless tobacco: Never Used  . Alcohol use No    MEDS: Prior to Admission medications   Medication Sig Start Date End Date Taking? Authorizing Provider  acetaminophen (TYLENOL) 500 MG tablet Take 500 mg by mouth every 6 (six) hours as needed (for pain.).    Yes Historical Provider, MD  apixaban (ELIQUIS) 2.5 MG TABS tablet Take 1 tablet (2.5 mg total) by mouth 2 (two) times daily. 02/17/16  Yes Peter M Martinique, MD  [facility] MELATONIN 5 MG TABS Take  5 mg by mouth at bedtime as needed (for sleep.).  12/14/16  Yes Historical Provider, MD  dexamethasone (DECADRON) 4 MG tablet TAKE 5 TABLETS AT ONE TIME TODAY WITH FOOD. STARTING TOMORROW, TAKE 3 PILLS A DAY WITH FOOD Patient taking differently: TAKE 1 TABLET (4 MG) BY MOUTH DAILY IN THE MORNING. 02/04/17  Yes Volanda Napoleon, MD  docusate sodium (COLACE) 100 MG capsule Take 100 mg by mouth 2 (two) times daily.   Yes Historical Provider, MD  isosorbide mononitrate (IMDUR) 30 MG 24 hr tablet TAKE 1 TABLET BY MOUTH TWICE A DAY 01/21/17  Yes Peter M Martinique, MD  lidocaine-prilocaine (EMLA) cream Apply 1 application topically as needed. Patient taking  differently: Apply 1 application topically daily as needed (prior to port begin access).  10/29/16  Yes Volanda Napoleon, MD  metoprolol succinate (TOPROL-XL) 50 MG 24 hr tablet Take 1 tablet (50 mg total) by mouth every morning. Take with or immediately following a meal. Patient taking differently: Take 50 mg by mouth daily. Take with or immediately following a meal. 07/12/15  Yes Peter M Martinique, MD  nitroGLYCERIN (NITROSTAT) 0.4 MG SL tablet Place 1 tablet (0.4 mg total) under the tongue every 5 (five) minutes as needed for chest pain (MAX 3 TABLETS). 11/08/15  Yes Peter M Martinique, MD  pantoprazole (PROTONIX) 40 MG tablet Take 1 tablet (40 mg total) by mouth daily. 12/12/16  Yes Volanda Napoleon, MD  pentoxifylline (TRENTAL) 400 MG CR tablet Take '400mg'$  tab daily x 1 week, then '400mg'$  BID; take with food Patient taking differently: Take 400 mg by mouth 2 (two) times daily. Take with food 12/31/16  Yes Eppie Gibson, MD  polyethylene glycol [facility] / Floria Raveling) packet Take 17 g by mouth daily as needed (constipation). Mix in 8 oz liquid and drink    Yes Historical Provider, MD  temazepam (RESTORIL) 15 MG capsule TAKE 1 CAPSULE BY MOUTH AT BEDTIME AS NEEDED FOR INSOMNIA MAY REPEAT X 1 DOSE 12/18/16  Yes Historical Provider, MD  traMADol (ULTRAM) 50 MG tablet Take 1 tablet (50 mg total) by mouth every 6 (six) hours as needed. Patient taking differently: Take 50 mg by mouth every 6 (six) hours as needed (for pain.).  07/16/16  Yes Volanda Napoleon, MD  vitamin E 400 UNIT capsule Take 400 IU daily x 1 week, then 400 IU BID Patient taking differently: Take 400 Units by mouth 2 (two) times daily.  12/31/16  Yes Eppie Gibson, MD  polyvinyl alcohol (LIQUIFILM TEARS) 1.4 % ophthalmic solution Place 1 drop into both eyes every hour as needed for dry eyes. Patient not taking: Reported on 02/05/2017 10/09/16   Lisette Abu, PA-C    ALLERGY: Allergies  Allergen Reactions  . Cefpodoxime Other (See Comments)     Nocturia & disturbed sleep  . Codeine Nausea Only    ROS: ROS  NEUROLOGIC EXAM: Awake, alert, oriented Memory and concentration grossly intact Speech fluent, appropriate CN grossly intact Motor exam: Upper Extremities Deltoid Bicep Tricep Grip  Right 5/5 5/5 5/5 5/5  Left 5/5 5/5 5/5 5/5   Lower Extremity IP Quad PF DF EHL  Right 5/5 5/5 5/5 5/5 5/5  Left 5/5 5/5 5/5 5/5 5/5   Sensation grossly intact to LT  [facility]: Peripherally enhancing right frontal lesion measures 2.7 x 2.88m, slightly decreased from prior. There has been interval improvement in the surrounding vasogenic edema and resolution of midline shift. No hydrocephalus.  IMPRESSION: 83year old woman with metastatic clear  cell renal carcinoma, with right frontal metastasis treatment failure after SRS. At this point, she is largely asymptomatic, and remains relatively functional. In this setting, I do think that surgical resection is reasonable given her state of health, and the goal of preserving functionality as long as possible.  PLAN: We will plan on proceeding with stereotactic right frontal craniotomy for resection of lesion   I have reviewed the situation at length with the patient and her family including her two daughters in the office. We discussed treatment options at this point which include symptomatic management with steroids, versus resection. Risks and benefits of the surgery were explained in detail, including the risk of worsening weakness, numbness, tingling, paralysis, coma, death, bleeding, seizure, hydrocephalus, and the general risks of anesthesia including heart attack, stroke, and DVT/PE. We did also discuss the possibility of poor recovery and the need for possible assistance with activities of daily living. With an understanding of these risks, the patient and her family do wish to proceed. All questions were answered.

## 2017-02-12 NOTE — Progress Notes (Signed)
Pharmacy Antibiotic Note Brooke Rios is a 83 y.o. female admitted on 02/12/2017. Pharmacy consulted for vancomycin dosing for surgical prophylaxis. No drain in place. CrCl ~ 30 ml/min.  Plan: Vancomycin 1 gram IV x 1 on 3/28 am  Pharmacy to sign off; please reconsult if needed   Height: '5\' 6"'$  (167.6 cm) Weight: 180 lb (81.6 kg) IBW/kg (Calculated) : 59.3  Temp (24hrs), Avg:97.9 F (36.6 C), Min:97.6 F (36.4 C), Max:98.1 F (36.7 C)   Recent Labs Lab 02/07/17 1535  WBC 10.7*  CREATININE 1.25*    Estimated Creatinine Clearance: 32.8 mL/min (A) (by C-G formula based on SCr of 1.25 mg/dL (H)).    Allergies  Allergen Reactions  . Cefpodoxime Other (See Comments)    Nocturia & disturbed sleep  . Codeine Nausea Only     Vincenza Hews, PharmD, BCPS 02/12/2017, 9:52 PM

## 2017-02-12 NOTE — Transfer of Care (Signed)
Immediate Anesthesia Transfer of Care Note  Patient: Brooke Rios  Procedure(s) Performed: Procedure(s) with comments: STERIOTACTIC RIGHT FRONTAL CRANIOTOMY, RESECTION OF TUMOR (Right) - STERIOTACTIC RIGHT FRONTAL CRANIOTOMY, RESECTION OF TUMOR APPLICATION OF CRANIAL NAVIGATION (Right)  Patient Location: PACU  Anesthesia Type:General  Level of Consciousness: awake, alert  and patient cooperative  Airway & Oxygen Therapy: Patient Spontanous Breathing and Patient connected to nasal cannula oxygen  Post-op Assessment: Report given to RN and Post -op Vital signs reviewed and stable  Post vital signs: Reviewed and stable  Last Vitals:  Vitals:   02/12/17 1250  BP: (!) 161/66  Pulse: 82  Resp: 18  Temp: 36.4 C    Last Pain:  Vitals:   02/12/17 1250  TempSrc: Oral      Patients Stated Pain Goal: 5 (22/24/11 4643)  Complications: No apparent anesthesia complications

## 2017-02-12 NOTE — Op Note (Signed)
PREOP DIAGNOSIS:  1. Metastatic renal carcinoma 2. Right frontal tumor   POSTOP DIAGNOSIS: Same  PROCEDURE: 1. Stereotactic right frontal craniotomy, resection of tumor 2. Use of intraoperative microscope for microdissection  SURGEON: Dr. Consuella Lose, MD  ASSISTANT: Dr. Erline Levine, MD  ANESTHESIA: General Endotracheal  EBL: 200cc  SPECIMENS: Right frontal tumor for permanent pathology  DRAINS: None  COMPLICATIONS: None immediate  CONDITION: Stable to PACU  HISTORY: Brooke Rios is a 83 y.o. female with a history of metastatic renal cell carcinoma, with right frontal metastatic lesion. This was treated with stereotactic radiosurgery back in August 2017. She had follow-up CT scan which demonstrated progressive enlargement of the lesion suggesting local recurrence. After lengthy discussion with the patient's oncologist, radiation oncologist, and her family, the decision was made to proceed with surgical resection. The risks and benefits of the surgery were explained in detail to the patient and her family including expected postoperative recovery period  PROCEDURE IN DETAIL: After informed consent was obtained and witnessed, the patient was brought to the operating room. After induction of general anesthesia, the patient was positioned on the operative table in the [place] headholder in the supine position. All pressure points were meticulously padded. The preoperative stereotactic CT scan was then co-registered with surface markers until satisfactory accuracy was achieved. Curvilinear skin incision was then marked out to allow access to the tumor.   After timeout was conducted, incision was infiltrated with local anesthetic with epinephrine. Incision was then made sharply and carried through the galea and the periosteum. A single piece myocutaneous flap was then elevated and reflected anteriorly. The stereotactic system was then used to plan out a craniotomy which would allow  complete access to the underlying tumor. Bur holes were then created and connected with the craniotome, and a bone flap was elevated. The dura was densely adherent to the cortical surface of the bone, and was removed with the flap. Good hemostasis was achieved on the brain surface. The microscope was then draped sterilely and brought into the field, and the remainder of the case was done under the microscope using microdissection technique.  The stereotactic system was used to identify the best place to make the corticectomy to allow access to the underlying tumor. The pia was then coagulated and cut, and the ultrasonic aspirator was used to dissect the underlying white matter. There was no frank tumor, however we did identify what appeared to be very gliotic white matter. A pseudo-plane was identified between this highly gliotic tissue and the surrounding more normal white matter. This plane was developed circumferentially around the lesion using a combination of bipolar electrocautery and ultrasonic aspirator, and general location was confirmed with the stereotactic system. The lesion was then removed and sent for permanent pathology. The edges of the resection cavity were then inspected, and further removal of gliotic lesional tissue was removed at the deep margin, until the ependyma of the right frontal horn was identified.  At this point the wound is irrigated with copious amounts of normal saline irrigation. Good hemostasis was confirmed on the brain and resection cavity. A layer of duramatrix was placed over the brain. Bone flap was replaced and plated with standard titanium plates and screws.  Muscle was then closed using interrupted 0 Vicryl stitches, and the galea was closed using interrupted 3-0 Vicryl sutures. The skin was closed using standard surgical skin staples. Sterile dressing was then applied after the [place] head holder was removed. The patient was then transferred to the  stretcher and  taken to the PACU in stable hemodynamic condition.  At the end of the case all sponge, needle, cottonoid, and instrument counts were correct.

## 2017-02-12 NOTE — Anesthesia Procedure Notes (Addendum)
Procedure Name: Intubation Date/Time: 02/12/2017 3:15 PM Performed by: Garrison Columbus T Pre-anesthesia Checklist: Patient identified, Emergency Drugs available, Suction available, Patient being monitored and Timeout performed Patient Re-evaluated:Patient Re-evaluated prior to inductionOxygen Delivery Method: Circle system utilized Preoxygenation: Pre-oxygenation with 100% oxygen Intubation Type: IV induction Ventilation: Mask ventilation without difficulty Laryngoscope Size: Miller and 2 Grade View: Grade II Tube type: Subglottic suction tube Tube size: 7.0 mm Number of attempts: 1 Airway Equipment and Method: Patient positioned with wedge pillow and Stylet Placement Confirmation: ETT inserted through vocal cords under direct vision,  positive ETCO2 and breath sounds checked- equal and bilateral Secured at: 21 cm Tube secured with: Tape Dental Injury: Teeth and Oropharynx as per pre-operative assessment  Difficulty Due To: Difficulty was anticipated, Difficult Airway- due to large tongue, Difficult Airway- due to limited oral opening and Difficult Airway- due to anterior larynx Future Recommendations: Recommend- induction with short-acting agent, and alternative techniques readily available Comments: Intubation by Denton Meek, SRNA.

## 2017-02-12 NOTE — Progress Notes (Signed)
eLink Physician-Brief Progress Note Patient Name: Brooke Rios DOB: 1924/12/11 MRN: [id number]   Date of Service  02/12/2017  HPI/Events of Note  83 yo presented with confusion h/o RCC  1. Metastatic renal carcinoma to brain 2. Right frontal tumor    s/p Stereotactic right frontal craniotomy, resection of tumor   resting in bed, not on oxygen, VS stable No acute issues  eICU Interventions  Follow Neuro Surgery recs No new orders at this time     Intervention Category Evaluation Type: New Patient Evaluation  Clarabel Marion 02/12/2017, 8:16 PM

## 2017-02-13 DIAGNOSIS — C7951 Secondary malignant neoplasm of bone: Secondary | ICD-10-CM

## 2017-02-13 DIAGNOSIS — C649 Malignant neoplasm of unspecified kidney, except renal pelvis: Secondary | ICD-10-CM

## 2017-02-13 DIAGNOSIS — C7931 Secondary malignant neoplasm of brain: Principal | ICD-10-CM

## 2017-02-13 LAB — CBC
HCT: 28.5 % — ABNORMAL LOW (ref 36.0–46.0)
HCT: 28.7 % — ABNORMAL LOW (ref 36.0–46.0)
Hemoglobin: 9.2 g/dL — ABNORMAL LOW (ref 12.0–15.0)
Hemoglobin: 9 g/dL — ABNORMAL LOW (ref 12.0–15.0)
MCH: 26.9 pg (ref 26.0–34.0)
MCH: 25.9 pg — ABNORMAL LOW (ref 26.0–34.0)
MCHC: 32.3 g/dL (ref 30.0–36.0)
MCHC: 31.4 g/dL (ref 30.0–36.0)
MCV: 83.3 fL (ref 78.0–100.0)
MCV: 82.7 fL (ref 78.0–100.0)
Platelets: 158 10*3/uL (ref 150–400)
Platelets: 151 10*3/uL (ref 150–400)
RBC: 3.42 MIL/uL — ABNORMAL LOW (ref 3.87–5.11)
RBC: 3.47 MIL/uL — ABNORMAL LOW (ref 3.87–5.11)
RDW: 18 % — ABNORMAL HIGH (ref 11.5–15.5)
RDW: 17.5 % — ABNORMAL HIGH (ref 11.5–15.5)
WBC: 9.2 10*3/uL (ref 4.0–10.5)
WBC: 10.7 10*3/uL — ABNORMAL HIGH (ref 4.0–10.5)

## 2017-02-13 LAB — BASIC METABOLIC PANEL
Anion gap: 9 (ref 5–15)
Anion gap: 11 (ref 5–15)
Anion gap: 11 (ref 5–15)
BUN: 28 mg/dL — ABNORMAL HIGH (ref 6–20)
BUN: 26 mg/dL — ABNORMAL HIGH (ref 6–20)
BUN: 27 mg/dL — ABNORMAL HIGH (ref 6–20)
CO2: 21 mmol/L — ABNORMAL LOW (ref 22–32)
CO2: 20 mmol/L — ABNORMAL LOW (ref 22–32)
CO2: 21 mmol/L — ABNORMAL LOW (ref 22–32)
Calcium: 7.4 mg/dL — ABNORMAL LOW (ref 8.9–10.3)
Calcium: 7.4 mg/dL — ABNORMAL LOW (ref 8.9–10.3)
Calcium: 7.4 mg/dL — ABNORMAL LOW (ref 8.9–10.3)
Chloride: 107 mmol/L (ref 101–111)
Chloride: 107 mmol/L (ref 101–111)
Chloride: 106 mmol/L (ref 101–111)
Creatinine, Ser: 1.08 mg/dL — ABNORMAL HIGH (ref 0.44–1.00)
Creatinine, Ser: 1.07 mg/dL — ABNORMAL HIGH (ref 0.44–1.00)
Creatinine, Ser: 1.1 mg/dL — ABNORMAL HIGH (ref 0.44–1.00)
GFR calc Af Amer: 51 mL/min — ABNORMAL LOW (ref >60)
GFR calc Af Amer: 52 mL/min — ABNORMAL LOW (ref >60)
GFR calc Af Amer: 50 mL/min — ABNORMAL LOW (ref >60)
GFR calc non Af Amer: 44 mL/min — ABNORMAL LOW (ref >60)
GFR calc non Af Amer: 45 mL/min — ABNORMAL LOW (ref >60)
GFR calc non Af Amer: 43 mL/min — ABNORMAL LOW (ref >60)
Glucose, Bld: 143 mg/dL — ABNORMAL HIGH (ref 65–99)
Glucose, Bld: 146 mg/dL — ABNORMAL HIGH (ref 65–99)
Glucose, Bld: 150 mg/dL — ABNORMAL HIGH (ref 65–99)
Potassium: 5.7 mmol/L — ABNORMAL HIGH (ref 3.5–5.1)
Potassium: 4.9 mmol/L (ref 3.5–5.1)
Potassium: 4.7 mmol/L (ref 3.5–5.1)
Sodium: 137 mmol/L (ref 135–145)
Sodium: 138 mmol/L (ref 135–145)
Sodium: 138 mmol/L (ref 135–145)

## 2017-02-13 MED ORDER — NITROGLYCERIN 0.4 MG SL SUBL: 0.4000 mg | SUBLINGUAL_TABLET | SUBLINGUAL | Status: DC | PRN

## 2017-02-13 MED ORDER — METOPROLOL SUCCINATE ER 25 MG PO TB24
50.0000 mg | ORAL_TABLET | Freq: Every day | ORAL | Status: DC
  Administered 2017-02-13 – 2017-02-15 (×3): 50 mg via ORAL
  Filled 2017-02-13 (×4): qty 2

## 2017-02-13 MED ORDER — POLYETHYLENE GLYCOL 3350 17 G PO PACK: 17.0000 g | PACK | Freq: Every day | ORAL | Status: DC | PRN

## 2017-02-13 MED ORDER — DEXAMETHASONE 4 MG PO TABS
6.0000 mg | ORAL_TABLET | Freq: Four times a day (QID) | ORAL | Status: AC
  Administered 2017-02-13 (×2): 6 mg via ORAL
  Filled 2017-02-13 (×2): qty 1

## 2017-02-13 MED ORDER — DEXAMETHASONE 4 MG PO TABS
4.0000 mg | ORAL_TABLET | Freq: Three times a day (TID) | ORAL | Status: DC
  Administered 2017-02-14 – 2017-02-15 (×2): 4 mg via ORAL
  Filled 2017-02-13: qty 1

## 2017-02-13 MED ORDER — MELATONIN 5 MG PO TABS
5.0000 mg | ORAL_TABLET | Freq: Every evening | ORAL | Status: DC | PRN
Start: 2017-02-13 — End: 2017-02-13

## 2017-02-13 MED ORDER — ISOSORBIDE MONONITRATE ER 30 MG PO TB24
30.0000 mg | ORAL_TABLET | Freq: Two times a day (BID) | ORAL | Status: DC
  Administered 2017-02-13 – 2017-02-15 (×4): 30 mg via ORAL
  Filled 2017-02-13 (×6): qty 1

## 2017-02-13 MED ORDER — DEXAMETHASONE 4 MG PO TABS
4.0000 mg | ORAL_TABLET | Freq: Four times a day (QID) | ORAL | Status: AC
  Administered 2017-02-14 (×3): 4 mg via ORAL
  Filled 2017-02-13 (×5): qty 1

## 2017-02-13 MED ORDER — MELATONIN 3 MG PO TABS
3.0000 mg | ORAL_TABLET | Freq: Every evening | ORAL | Status: DC | PRN
  Administered 2017-02-14: 3 mg via ORAL
  Filled 2017-02-13 (×2): qty 1

## 2017-02-13 NOTE — Progress Notes (Signed)
Pt seen and examined. No issues overnight. No pain, no visual changes, no new N/T/W.   EXAM: Temp:  [97.6 F (36.4 C)-98.7 F (37.1 C)] 98.5 F (36.9 C) (03/28 1200) Pulse Rate:  [58-82] 60 (03/28 1200) Resp:  [13-25] 25 (03/28 1200) BP: (98-161)/(37-74) 98/61 (03/28 1200) SpO2:  [95 %-100 %] 97 % (03/28 1200) Arterial Line BP: (120-152)/(42-58) 131/48 (03/28 0900) Weight:  [81.6 kg (180 lb)-81.9 kg (180 lb 9.6 oz)] 81.6 kg (180 lb) (03/27 1311) Intake/Output      03/27 0701 - 03/28 0700 03/28 0701 - 03/29 0700   P.O.  300   I.V. (mL/kg) 1592.5 (19.5) 50 (0.6)   IV Piggyback 305 105   Total Intake(mL/kg) 1897.5 (23.3) 455 (5.6)   Urine (mL/kg/hr) 1950 163 (0.4)   Stool  0 (0)   Blood 200    Total Output 2150 163   Net -252.5 +292        Stool Occurrence  1 x    Awake, alert, oriented Speech fluent CN intact Good strength throughout  LABS: Lab Results  Component Value Date   CREATININE 1.10 (H) 02/13/2017   BUN 27 (H) 02/13/2017   NA 138 02/13/2017   K 4.7 02/13/2017   CL 106 02/13/2017   CO2 21 (L) 02/13/2017   Lab Results  Component Value Date   WBC 10.7 (H) 02/13/2017   HGB 9.0 (L) 02/13/2017   HCT 28.7 (L) 02/13/2017   MCV 82.7 02/13/2017   PLT 151 02/13/2017    IMPRESSION: - 83 y.o. female POD#1 right frontal crani for resection of tumor, hx of clear cell renal CA. Neurologically at baseline  PLAN: - OOB today - d/c a-line, foley - transfer to floor with monitoring
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
## 2017-02-13 NOTE — Anesthesia Postprocedure Evaluation (Addendum)
Anesthesia Post Note  Patient: Brooke Rios  Procedure(s) Performed: Procedure(s) (LRB): STERIOTACTIC RIGHT FRONTAL CRANIOTOMY, RESECTION OF TUMOR (Right) APPLICATION OF CRANIAL NAVIGATION (Right)  Patient location during evaluation: Other Anesthesia Type: General Level of consciousness: awake and alert Pain management: pain level controlled Vital Signs Assessment: post-procedure vital signs reviewed and stable Respiratory status: spontaneous breathing, nonlabored ventilation, respiratory function stable and patient connected to nasal cannula oxygen Cardiovascular status: blood pressure returned to baseline and stable Postop Assessment: no signs of nausea or vomiting Anesthetic complications: no       Last Vitals:  Vitals:   02/13/17 0700 02/13/17 0800  BP: (!) 135/57 (!) 111/48  Pulse: 64 (!) 59  Resp: 17 (!) 21  Temp:  37.1 C    Last Pain:  Vitals:   02/13/17 0800  TempSrc: Oral  PainSc: 3                  Effie Berkshire

## 2017-02-14 ENCOUNTER — Encounter (HOSPITAL_COMMUNITY): Payer: Self-pay | Admitting: Neurosurgery

## 2017-02-14 NOTE — Evaluation (Signed)
Physical Therapy Evaluation Patient Details Name: Brooke Rios MRN: [id number] DOB: Jul 27, 1925 Today's Date: 02/14/2017   History of Present Illness  83 y.o. female s/p right frontal crani for resection of tumor. Pt has a past medical history including coronary artery disease; Hypertension; Metastatic renal cell carcinoma to bone (12/26/2016); Metastatic renal cell carcinoma to brain (07/09/2016); Metastatic renal cell carcinoma to lung (07/09/2016); Myocardial infarction (2012); Osteoarthritis; pacemaker; and Vertigo.   Clinical Impression  Pt is s/p surgery above with deficits below. PTA, pt and family reports pt was independent with all functional mobility. Upon evaluation, pt presenting with decreased balance, strength, and activity tolerance, and required from min to min guard assist with functional mobility tasks. Pt performed gait and stair training this session. Reviewed assist level required at home and pt and family agreeable. Recommending HHPT at d/c to increase independence with functional mobility. Will continue to follow to maximize functional mobility independence.     Follow Up Recommendations Home health PT;Supervision/Assistance - 24 hour    Equipment Recommendations  None recommended by PT    Recommendations for Other Services       Precautions / Restrictions Precautions Precautions: Fall Restrictions Weight Bearing Restrictions: No      Mobility  Bed Mobility               General bed mobility comments: In chair upon entry   Transfers Overall transfer level: Needs assistance Equipment used: Rolling walker (2 wheeled) Transfers: Sit to/from Stand Sit to Stand: Min guard         General transfer comment: Min Guard for [place]. Verbal cues for appropriate UE placement during transfer.   Ambulation/Gait Ambulation/Gait assistance: Min guard;Supervision Ambulation Distance (Feet): 100 Feet Assistive device: Rolling walker (2 wheeled) Gait  Pattern/deviations: Step-through pattern;Decreased stride length;Trunk flexed Gait velocity: Decreased  Gait velocity interpretation: Below normal speed for age/gender General Gait Details: Verbal cues for upright posture. Slow gait with RW. Verbal cues for sequencing using RW. Educated to use RW at home to increase stability.   Stairs Stairs: Yes Stairs assistance: Min assist Stair Management: One rail Right;Step to pattern;Forwards Number of Stairs: 3 General stair comments: Verbal cues for LE sequencing and step to technique. Verbal cues to use BUE on R rail to increase safety and stability. Educated pt and pt's daughter about assist required at home for stair management. Pt demonstrating SOB at end of stair training, however, pt requesting to continue walking. Oxygen sats at 98% on RA. OT brought chair for seated rest break.   Wheelchair Mobility    Modified Rankin (Stroke Patients Only)       Balance Overall balance assessment: Needs assistance Sitting-balance support: No upper extremity supported;Feet supported Sitting balance-Leahy Scale: Fair     Standing balance support: Bilateral upper extremity supported;During functional activity Standing balance-Leahy Scale: Poor Standing balance comment: Reliant on RW for balance                              Pertinent Vitals/Pain Pain Assessment: No/denies pain    Home Living Family/patient expects to be discharged to:: Private residence Living Arrangements: Children Available Help at Discharge: Family;Available 24 hours/day Type of Home: House Home Access: Stairs to enter Entrance Stairs-Rails: Right Entrance Stairs-Number of Steps: 4 Home Layout: Two level;Able to live on main level with bedroom/bathroom Home Equipment: Bedside commode;Walker - 2 wheels;Cane - single point;Grab bars - tub/shower;Hand held shower head;Shower seat;Other (comment) (sock aid, reacher)  Additional Comments: Pt's daughter is Therapist, sports and is  available to assist pt as needed.     Prior Function Level of Independence: Independent               Hand Dominance   Dominant Hand: Right    Extremity/Trunk Assessment   Upper Extremity Assessment Upper Extremity Assessment: Defer to OT evaluation    Lower Extremity Assessment Lower Extremity Assessment: Generalized weakness (at least 3+/5 throughout LE; not formally tested)    Cervical / Trunk Assessment Cervical / Trunk Assessment: Kyphotic  Communication   Communication: No difficulties  Cognition Arousal/Alertness: Awake/alert Behavior During Therapy: WFL for tasks assessed/performed Overall Cognitive Status: Within Functional Limits for tasks assessed                                        General Comments General comments (skin integrity, edema, etc.): Pt's daughters present throughout session. Discussed level of assist required at home and HHPT recommendations. Daughters agreeable to recommendations.     Exercises     Assessment/Plan    PT Assessment Patient needs continued PT services  PT Problem List Decreased strength;Decreased activity tolerance;Decreased balance;Decreased mobility;Decreased knowledge of use of DME       PT Treatment Interventions DME instruction;Gait training;Stair training;Functional mobility training;Therapeutic activities;Therapeutic exercise;Balance training;Neuromuscular re-education;Patient/family education    PT Goals (Current goals can be found in the Care Plan section)  Acute Rehab PT Goals Patient Stated Goal: to return to [employer], playing scrabble and cards PT Goal Formulation: With patient Time For Goal Achievement: 02/21/17 Potential to Achieve Goals: Good    Frequency Min 3X/week   Barriers to discharge        Co-evaluation PT/OT/SLP Co-Evaluation/Treatment: Yes Reason for Co-Treatment: Complexity of the patient's impairments (multi-system involvement)   OT goals addressed  during session: ADL's and self-care       End of Session Equipment Utilized During Treatment: Gait belt Activity Tolerance: Patient tolerated treatment well Patient left: in chair;with call bell/phone within reach;with family/visitor present Nurse Communication: Mobility status PT Visit Diagnosis: Other abnormalities of gait and mobility (R26.89);Unsteadiness on feet (R26.81)    Time: 2761-4709 PT Time Calculation (min) (ACUTE ONLY): 33 min   Charges:   PT Evaluation $PT Eval Low Complexity: 1 Procedure     PT G Codes:        Nicky Pugh, PT, DPT  Acute Rehabilitation Services  Pager: [phone]   Army Melia 02/14/2017, 3:20 PM

## 2017-02-14 NOTE — Care Management Note (Signed)
Case Management Note  Patient Details  Name: Brooke Rios MRN: [id number] Date of Birth: 1925-06-04  Subjective/Objective:  Pt underwent:   STERIOTACTIC RIGHT FRONTAL CRANIOTOMY, RESECTION OF TUMOR. She is from home with family.                 Action/Plan: Awaiting PT/OT recommendations. CM following for d/c needs, physician orders.   Expected Discharge Date:                  Expected Discharge Plan:     In-House Referral:     Discharge planning Services     Post Acute Care Choice:    Choice offered to:     DME Arranged:    DME Agency:     HH Arranged:    HH Agency:     Status of Service:  In process, will continue to follow  If discussed at Long Length of Stay Meetings, dates discussed:    Additional Comments:  Pollie Friar, RN 02/14/2017, 11:27 AM

## 2017-02-14 NOTE — Progress Notes (Signed)
No issues overnight. Pt without complaints today. Ambulating with PT, also up/down stairs.  EXAM:  BP (!) 130/55   Pulse (!) 57   Temp 99.1 F (37.3 C) (Oral)   Resp 18   Ht '5\' 6"'$  (1.676 m)   Wt 180 lb (81.6 kg)   SpO2 100%   BMI 29.05 kg/m   Awake, alert, oriented  Speech fluent, appropriate  CN grossly intact  5/5 BUE/BLE  Wound c/d/I  IMPRESSION:  83 y.o. female POD#2 s/p right frontal crani for tumor, doing very well.  PLAN: - Cont to mobilize as tolerated, likely discharge tomorrow.

## 2017-02-14 NOTE — Progress Notes (Signed)
Patient with a temp of 101. Medication given as ordered and patient educated on the use of incentive spirometer. Patient demonstrated understanding.

## 2017-02-14 NOTE — Progress Notes (Signed)
PT Cancellation Note  Patient Details Name: Brooke Rios MRN: [id number] DOB: 1925/03/30   Cancelled Treatment:    Reason Eval/Treat Not Completed: Fatigue/lethargy limiting ability to participate Pt reporting she just got back in bed and is really tired. Requesting PT to come back at later time. Will reattempt as schedule allows.   Nicky Pugh, PT, DPT  Acute Rehabilitation Services  Pager: [place] 02/14/2017, 10:22 AM

## 2017-02-14 NOTE — Evaluation (Signed)
Occupational Therapy Evaluation Patient Details Name: Brooke Rios MRN: [id number] DOB: 24-Apr-1925 Today's Date: 02/14/2017    History of Present Illness 83 y.o. female s/p right frontal crani for resection of tumor. Pt has a past medical history including coronary artery disease; Hypertension; Metastatic renal cell carcinoma to bone (12/26/2016); Metastatic renal cell carcinoma to brain (07/09/2016); Metastatic renal cell carcinoma to lung (07/09/2016); Myocardial infarction (2012); Osteoarthritis; pacemaker; and Vertigo.    Clinical Impression   PTA Pt independent in ADL and mobility. Pt currently min A for LB ADL and min A for ambulation with RW. Pt very cheerful and family very supportive, very positive therapy session overall - a charming family! Please see OT problem list below as Pt will benefit from skilled OT in the acute setting prior to dc to venue below to maximize safety and independence in ADL. Next session should focus on AE practice (Pt has kit but has not used it), and shower transfer.     Follow Up Recommendations  Home health OT;Supervision - Intermittent    Equipment Recommendations  None recommended by OT (Pt has appropriate DME)    Recommendations for Other Services       Precautions / Restrictions Precautions Precautions: Fall Restrictions Weight Bearing Restrictions: No      Mobility Bed Mobility               General bed mobility comments: In chair upon entry   Transfers Overall transfer level: Needs assistance Equipment used: Rolling walker (2 wheeled) Transfers: Sit to/from Stand Sit to Stand: Min guard         General transfer comment: Min Guard for Toast. Verbal cues for appropriate UE placement during transfer.     Balance Overall balance assessment: Needs assistance Sitting-balance support: No upper extremity supported;Feet supported Sitting balance-Leahy Scale: Fair Sitting balance - Comments: able to to don/doff sock sitting  edge of recliner   Standing balance support: Bilateral upper extremity supported;During functional activity Standing balance-Leahy Scale: Poor Standing balance comment: Reliant on RW for balance                            ADL either performed or assessed with clinical judgement   ADL Overall ADL's : Needs assistance/impaired Eating/Feeding: Modified independent;Sitting   Grooming: Wash/dry face;Min guard Grooming Details (indicate cue type and reason): standing at sink Upper Body Bathing: Minimal assistance;With caregiver independent assisting;Sitting   Lower Body Bathing: Minimal assistance;With caregiver independent assisting   Upper Body Dressing : Minimal assistance;With caregiver independent assisting;Sitting   Lower Body Dressing: Minimal assistance;With caregiver independent assisting Lower Body Dressing Details (indicate cue type and reason): min A to don/doff socks sitting in recliner. Pt has AE kit at home to assist with LB dressing Toilet Transfer: Min guard;Ambulation;BSC   Toileting- Water quality scientist and Hygiene: Minimal assistance   Tub/ Banker: Minimal assistance   Functional mobility during ADLs: Minimal assistance;Cueing for safety;Rolling walker General ADL Comments: Pt also has AE kit for LB bathing and dressing as she's had a fall when attempting to put on her shoes previously     Vision Baseline Vision/History: No visual deficits Patient Visual Report: No change from baseline Additional Comments: Pt able to read from menu and visually scan around room to find objects and read signs on door     Perception     Praxis      Pertinent Vitals/Pain Pain Assessment: No/denies pain     Hand  Dominance Right   Extremity/Trunk Assessment Upper Extremity Assessment Upper Extremity Assessment: Overall WFL for tasks assessed   Lower Extremity Assessment Lower Extremity Assessment: Defer to PT evaluation   Cervical / Trunk  Assessment Cervical / Trunk Assessment: Kyphotic   Communication Communication Communication: No difficulties   Cognition Arousal/Alertness: Awake/alert Behavior During Therapy: WFL for tasks assessed/performed Overall Cognitive Status: Within Functional Limits for tasks assessed                                 General Comments: Alert and oriented x4, able to problem solve during session   General Comments  Pt's daughters present throughout session. Discussed level of assist required at home and HHPT recommendations. Daughters agreeable to recommendations.     Exercises     Shoulder Instructions      Home Living Family/patient expects to be discharged to:: Private residence Living Arrangements: Children Available Help at Discharge: Family;Available 24 hours/day Type of Home: House Home Access: Stairs to enter [employer] of Steps: 4 Entrance Stairs-Rails: Right Home Layout: Two level;Able to live on main level with bedroom/bathroom     Bathroom Shower/Tub: Occupational psychologist: Standard Bathroom Accessibility: Yes   Home Equipment: Bedside commode;Walker - 2 wheels;Cane - single point;Grab bars - tub/shower;Hand held shower head;Shower seat;Other (comment) (sock aid, reacher)   Additional Comments: Pt's daughter is Therapist, sports and is available to assist pt as needed.       Prior Functioning/Environment Level of Independence: Independent                 OT Problem List: Impaired balance (sitting and/or standing);Decreased activity tolerance;Decreased safety awareness      OT Treatment/Interventions: Energy conservation;DME and/or AE instruction;Therapeutic activities;Patient/family education;Balance training    OT Goals(Current goals can be found in the care plan section) Acute Rehab OT Goals Patient Stated Goal: to return to [employer], playing scrabble and cards OT Goal Formulation: With patient Time For Goal  Achievement: 02/28/17 Potential to Achieve Goals: Good ADL Goals Pt Will Transfer to Toilet: with modified independence;ambulating (with RW) Pt Will Perform Toileting - Clothing Manipulation and hygiene: with modified independence;sit to/from stand Pt Will Perform Tub/Shower Transfer: Shower transfer;with supervision;with caregiver independent in assisting;ambulating;shower seat;grab bars Additional ADL Goal #1: Pt will demonstrate proper use of AE kit items to assist with LB bathing/dressing  OT Frequency: Min 2X/week   Barriers to D/C:            Co-evaluation PT/OT/SLP Co-Evaluation/Treatment: Yes Reason for Co-Treatment: Complexity of the patient's impairments (multi-system involvement);To address functional/ADL transfers   OT goals addressed during session: ADL's and self-care      End of Session Equipment Utilized During Treatment: Gait belt;Rolling walker Nurse Communication: Mobility status  Activity Tolerance: Patient tolerated treatment well Patient left: in chair;with call bell/phone within reach;with family/visitor present  OT Visit Diagnosis: Unsteadiness on feet (R26.81)                Time: 0973-5329 OT Time Calculation (min): 33 min Charges:  OT General Charges $OT Visit: 1 Procedure OT Evaluation $OT Eval Moderate Complexity: 1 Procedure G-Codes:     Hulda Humphrey OTR/L [facility] 02/14/2017, 5:21 PM

## 2017-02-15 DIAGNOSIS — R5383 Other fatigue: Secondary | ICD-10-CM

## 2017-02-15 DIAGNOSIS — R600 Localized edema: Secondary | ICD-10-CM

## 2017-02-15 MED ORDER — METHYLPREDNISOLONE 4 MG PO TBPK: ORAL_TABLET | ORAL | 0 refills | Status: DC

## 2017-02-15 MED ORDER — APIXABAN 2.5 MG PO TABS
2.5000 mg | ORAL_TABLET | Freq: Two times a day (BID) | ORAL | 3 refills | Status: DC
Start: 2017-02-20 — End: 2017-03-05

## 2017-02-15 MED ORDER — LEVETIRACETAM 500 MG PO TABS: 500.0000 mg | ORAL_TABLET | Freq: Two times a day (BID) | ORAL | 0 refills | Status: DC

## 2017-02-15 MED ORDER — HYDROCODONE-ACETAMINOPHEN 5-325 MG PO TABS: 1.0000 | ORAL_TABLET | ORAL | 0 refills | Status: DC | PRN

## 2017-02-15 NOTE — Progress Notes (Signed)
Pt discharged at this time with daughter taking all personal belongings. Surgical staple site dry and intact. Pt has a follow up appt with surgeon to remove staples. Discharge instructions provided with prescriptions with verbal understanding. IV removed, dry dressing applied. No noted distress.

## 2017-02-15 NOTE — Progress Notes (Signed)
Ms. Rease is making progress. She walked a little bit yesterday. She was quite fatigued. She may need some physical therapy.  The pathology report is still not out yet. Hopefully, it will be out today.  She is eating better. She is not complaining of any pain.  Her vital signs all look stable. Her blood pressure is 135/60. Her pulse is 66. Temperature 97.8. Her head and neck exam shows the healing craniectomy scar in the forehead. She does have some ecchymoses on her face. There is no intraoral lesions. She has no adenopathy in the neck. Lungs are clear. Cardiac exam regular rate and rhythm. She has a paced rhythm. Abdomen is soft. Bowel sounds are present. Extremities shows some trace edema in her legs. Neurological exam shows no focal neurological deficits.  She is come through surgery very nicely. She has done a great job. She hopefully will be oh to go home today or tomorrow. I'm surprised the pathology report is not back yet. Hopefully it will be back today.  We will continue to follow her up in the office.  Lattie Haw, MD  Romans 5:3-5

## 2017-02-15 NOTE — Progress Notes (Signed)
qPhysical Therapy Treatment Patient Details Name: Brooke Rios MRN: [id number] DOB: 1925/06/05 Today's Date: 02/15/2017    History of Present Illness 83 y.o. female s/p right frontal crani for resection of tumor. Pt has a past medical history including coronary artery disease; Hypertension; Metastatic renal cell carcinoma to bone (12/26/2016); Metastatic renal cell carcinoma to brain (07/09/2016); Metastatic renal cell carcinoma to lung (07/09/2016); Myocardial infarction (2012); Osteoarthritis; pacemaker; and Vertigo.     PT Comments    Pt progressing towards goals. Pt increasing ambulation distance this session and increasing activity tolerance. Pt and family education provided about activity pacing upon return home. Continue to recommend d/c recommendations below. Will continue to follow.    Follow Up Recommendations  Home health PT;Supervision/Assistance - 24 hour     Equipment Recommendations  None recommended by PT    Recommendations for Other Services       Precautions / Restrictions Precautions Precautions: Fall Restrictions Weight Bearing Restrictions: No    Mobility  Bed Mobility Overal bed mobility: Needs Assistance Bed Mobility: Supine to Sit     Supine to sit: Supervision;HOB elevated     General bed mobility comments: Relied heavily on bed rails for bed mobility. supervision for safety and verbal cues for sequencing.   Transfers Overall transfer level: Needs assistance Equipment used: Rolling walker (2 wheeled) Transfers: Sit to/from Stand Sit to Stand: Min guard         General transfer comment: Min guard for steadying. Verbal cues for appropriate hand placement during transfer.   Ambulation/Gait Ambulation/Gait assistance: Min guard;Supervision Ambulation Distance (Feet): 150 Feet Assistive device: Rolling walker (2 wheeled) Gait Pattern/deviations: Step-through pattern;Decreased stride length;Trunk flexed Gait velocity: Decreased  Gait velocity  interpretation: Below normal speed for age/gender General Gait Details: Verbal cues for upright posture and for activity pacing during mobility. Educated about importance of mobility at home and level of assist required at home. Pt reporting she feels less fatigue than when walking last night with NT.    Stairs            Wheelchair Mobility    Modified Rankin (Stroke Patients Only)       Balance Overall balance assessment: Needs assistance Sitting-balance support: No upper extremity supported;Feet supported Sitting balance-Leahy Scale: Fair     Standing balance support: Bilateral upper extremity supported;During functional activity Standing balance-Leahy Scale: Poor Standing balance comment: Reliant on RW for balance                             Cognition Arousal/Alertness: Awake/alert Behavior During Therapy: WFL for tasks assessed/performed Overall Cognitive Status: Within Functional Limits for tasks assessed                                        Exercises      General Comments General comments (skin integrity, edema, etc.): Pt's daughter present throughout. Education to pt and daughter about activity pacing upon return home, and strategic placement of chairs in home for seated rest breaks if needed.       Pertinent Vitals/Pain Pain Assessment: No/denies pain    Home Living                      Prior Function            PT Goals (current goals can now be found  in the care plan section) Acute Rehab PT Goals Patient Stated Goal: to return to [employer], playing scrabble and cards PT Goal Formulation: With patient Time For Goal Achievement: 02/21/17 Potential to Achieve Goals: Good Progress towards PT goals: Progressing toward goals    Frequency    Min 3X/week      PT Plan Current plan remains appropriate    Co-evaluation             End of Session Equipment Utilized During Treatment: Gait  belt Activity Tolerance: Patient tolerated treatment well Patient left: in chair;with call bell/phone within reach;with family/visitor present Nurse Communication: Mobility status PT Visit Diagnosis: Other abnormalities of gait and mobility (R26.89);Unsteadiness on feet (R26.81)     Time: 0722-5750 PT Time Calculation (min) (ACUTE ONLY): 12 min  Charges:  $Gait Training: 8-22 mins                    G Codes:       Nicky Pugh, PT, DPT  Acute Rehabilitation Services  Pager: [phone]   Army Melia 02/15/2017, 12:53 PM

## 2017-02-15 NOTE — Discharge Summary (Signed)
Physician Discharge Summary  Patient ID: Brooke Rios MRN: [id number] DOB/AGE: 12-22-1924 83 y.o.  Admit date: 02/12/2017 Discharge date: 02/15/2017  PREOP DIAGNOSIS:  1. Metastatic renal carcinoma 2. Right frontal tumor   POSTOP DIAGNOSIS: Same  PROCEDURE: 1. Stereotactic right frontal craniotomy, resection of tumor 2. Use of intraoperative microscope for microdissection  Discharged Condition: Stable  Hospital Course:  Brooke Rios is a 83 y.o. female was admitted for the above procedure. Her post-operative course was uncomplicated. She was ambulating. Tolerating po. Without any pain. PT rec HH. Agree with that decision.  Discharge Exam: Blood pressure (!) 112/49, pulse 60, temperature 97.7 F (36.5 C), temperature source Oral, resp. rate 18, height '5\' 6"'$  (1.676 m), weight 81.6 kg (180 lb), SpO2 99 %. Awake, alert, oriented Speech fluent, appropriate CN grossly intact 5/5 BUE/BLE Wound c/d/i  Disposition: 06-Home-Health Care Svc  Discharge Instructions    Call MD for:  difficulty breathing, headache or visual disturbances    Complete by:  As directed    Call MD for:  persistant dizziness or light-headedness    Complete by:  As directed    Call MD for:  redness, tenderness, or signs of infection (pain, swelling, redness, odor or green/yellow discharge around incision site)    Complete by:  As directed    Call MD for:  severe uncontrolled pain    Complete by:  As directed    Call MD for:  temperature >100.4    Complete by:  As directed    Diet general    Complete by:  As directed    Driving Restrictions    Complete by:  As directed    Do not drive until given clearance.   Increase activity slowly    Complete by:  As directed    Lifting restrictions    Complete by:  As directed    Do not lift anything >10lbs. Avoid bending and twisting in awkward positions. Avoid bending at the back.   May shower / Bathe    Complete by:  As directed    Okay to shower if  wearing a water proof shower cap   Remove dressing in 24 hours    Complete by:  As directed      Allergies as of 02/15/2017      Reactions   Cefpodoxime Other (See Comments)   Nocturia & disturbed sleep   Codeine Nausea Only      Medication List    STOP taking these medications   dexamethasone 4 MG tablet Commonly known as:  DECADRON   traMADol 50 MG tablet Commonly known as:  ULTRAM     TAKE these medications   acetaminophen 500 MG tablet Commonly known as:  TYLENOL Take 500 mg by mouth every 6 (six) hours as needed (for pain.).   apixaban 2.5 MG Tabs tablet Commonly known as:  ELIQUIS Take 1 tablet (2.5 mg total) by mouth 2 (two) times daily. Start taking on:  02/20/2017   [facility] MELATONIN 5 MG Tabs Generic drug:  Melatonin Take 5 mg by mouth at bedtime as needed (for sleep.).   docusate sodium 100 MG capsule Commonly known as:  COLACE Take 100 mg by mouth 2 (two) times daily.   HYDROcodone-acetaminophen 5-325 MG tablet Commonly known as:  NORCO/VICODIN Take 1 tablet by mouth every 4 (four) hours as needed for moderate pain.   isosorbide mononitrate 30 MG 24 hr tablet Commonly known as:  IMDUR TAKE 1 TABLET BY MOUTH TWICE A DAY  levETIRAcetam 500 MG tablet Commonly known as:  KEPPRA Take 1 tablet (500 mg total) by mouth 2 (two) times daily.   lidocaine-prilocaine cream Commonly known as:  EMLA Apply 1 application topically as needed. What changed:  when to take this  reasons to take this   methylPREDNISolone 4 MG Tbpk tablet Commonly known as:  MEDROL DOSEPAK Take according to package inserts   metoprolol succinate 50 MG 24 hr tablet Commonly known as:  TOPROL-XL Take 1 tablet (50 mg total) by mouth every morning. Take with or immediately following a meal. What changed:  when to take this  additional instructions   nitroGLYCERIN 0.4 MG SL tablet Commonly known as:  NITROSTAT Place 1 tablet (0.4 mg total) under the tongue every 5 (five) minutes  as needed for chest pain (MAX 3 TABLETS).   pantoprazole 40 MG tablet Commonly known as:  PROTONIX Take 1 tablet (40 mg total) by mouth daily.   pentoxifylline 400 MG CR tablet Commonly known as:  TRENTAL Take '400mg'$  tab daily x 1 week, then '400mg'$  BID; take with food What changed:  how much to take  how to take this  when to take this  additional instructions   polyethylene glycol packet Commonly known as:  MIRALAX / GLYCOLAX Take 17 g by mouth daily as needed (constipation). Mix in 8 oz liquid and drink   polyvinyl alcohol 1.4 % ophthalmic solution Commonly known as:  LIQUIFILM TEARS Place 1 drop into both eyes every hour as needed for dry eyes.   temazepam 15 MG capsule Commonly known as:  RESTORIL TAKE 1 CAPSULE BY MOUTH AT BEDTIME AS NEEDED FOR INSOMNIA MAY REPEAT X 1 DOSE   vitamin E 400 UNIT capsule Take 400 IU daily x 1 week, then 400 IU BID What changed:  how much to take  how to take this  when to take this  additional instructions        Signed: Traci Sermon 02/15/2017, 10:19 AM

## 2017-02-15 NOTE — Care Management Important Message (Signed)
Important Message  Patient Details  Name: Brooke Rios MRN: [id number] Date of Birth: [birth year]   Medicare Important Message Given:  Yes    Denijah Karrer 02/15/2017, 1:46 PM

## 2017-02-15 NOTE — Care Management Note (Signed)
Case Management Note  Patient Details  Name: Brooke Rios MRN: [id number] Date of Birth: 1924-12-25  Subjective/Objective:                    Action/Plan: Pt discharging home with her daughter with orders for [facility] services. CM met with patient and spoke to her daughter over the phone and provided a list of [facility] agencies. They selected [facility]. Santiago Glad with [facility] notified and accepted the referral.  Family in the room or daughter to provide transportation home.   Expected Discharge Date:  02/15/17               Expected Discharge Plan:  [place]  In-House Referral:     Discharge planning Services  CM Consult  Post Acute Care Choice:  Home Health Choice offered to:  Patient, Adult Children  DME Arranged:    DME Agency:     HH Arranged:  PT, OT HH Agency:  [place]  Status of Service:  Completed, signed off  If discussed at [place] of Stay Meetings, dates discussed:    Additional Comments:  Pollie Friar, RN 02/15/2017, 1:09 PM

## 2017-02-19 ENCOUNTER — Encounter: Payer: Self-pay | Admitting: Internal Medicine

## 2017-02-21 ENCOUNTER — Other Ambulatory Visit: Payer: Medicare Other

## 2017-02-21 ENCOUNTER — Ambulatory Visit: Payer: Medicare Other | Admitting: Hematology & Oncology

## 2017-02-21 ENCOUNTER — Inpatient Hospital Stay: Payer: Medicare Other

## 2017-02-27 ENCOUNTER — Encounter: Payer: Self-pay | Admitting: Hematology & Oncology

## 2017-02-28 ENCOUNTER — Ambulatory Visit (HOSPITAL_BASED_OUTPATIENT_CLINIC_OR_DEPARTMENT_OTHER): Payer: Medicare Other

## 2017-02-28 ENCOUNTER — Ambulatory Visit (HOSPITAL_BASED_OUTPATIENT_CLINIC_OR_DEPARTMENT_OTHER): Payer: Medicare Other | Admitting: Family

## 2017-02-28 VITALS — BP 123/56 | HR 74 | Temp 97.6°F | Resp 20 | Wt 182.1 lb

## 2017-02-28 DIAGNOSIS — C7951 Secondary malignant neoplasm of bone: Secondary | ICD-10-CM

## 2017-02-28 DIAGNOSIS — C7800 Secondary malignant neoplasm of unspecified lung: Principal | ICD-10-CM

## 2017-02-28 DIAGNOSIS — C649 Malignant neoplasm of unspecified kidney, except renal pelvis: Secondary | ICD-10-CM | POA: Diagnosis not present

## 2017-02-28 DIAGNOSIS — R5383 Other fatigue: Secondary | ICD-10-CM | POA: Diagnosis not present

## 2017-02-28 DIAGNOSIS — N390 Urinary tract infection, site not specified: Secondary | ICD-10-CM | POA: Diagnosis not present

## 2017-02-28 DIAGNOSIS — C641 Malignant neoplasm of right kidney, except renal pelvis: Secondary | ICD-10-CM

## 2017-02-28 DIAGNOSIS — N3000 Acute cystitis without hematuria: Secondary | ICD-10-CM | POA: Diagnosis not present

## 2017-02-28 DIAGNOSIS — C7931 Secondary malignant neoplasm of brain: Secondary | ICD-10-CM | POA: Diagnosis not present

## 2017-02-28 LAB — COMPREHENSIVE METABOLIC PANEL (CC13)
ALT: 20 IU/L (ref 0–32)
AST: 12 IU/L (ref 0–40)
Albumin, Serum: 3.2 g/dL — ABNORMAL LOW (ref 3.5–4.7)
Albumin/Globulin Ratio: 1.1 — ABNORMAL LOW (ref 1.2–2.2)
Alkaline Phosphatase, S: 99 IU/L (ref 39–117)
BUN/Creatinine Ratio: 17 (ref 12–28)
BUN: 19 mg/dL (ref 8–27)
Bilirubin Total: 0.3 mg/dL (ref 0.0–1.2)
CALCIUM: 9.3 mg/dL (ref 8.7–10.3)
CO2: 22 mmol/L (ref 18–29)
CREATININE: 1.14 mg/dL — AB (ref 0.57–1.00)
Chloride, Ser: 98 mmol/L (ref 96–106)
GFR, EST AFRICAN AMERICAN: 49 mL/min/{1.73_m2} — AB (ref >59)
GFR, EST NON AFRICAN AMERICAN: 43 mL/min/{1.73_m2} — AB (ref >59)
GLOBULIN, TOTAL: 2.8 g/dL (ref 1.5–4.5)
Glucose: 148 mg/dL — ABNORMAL HIGH (ref 65–99)
Potassium, Ser: 3.9 mmol/L (ref 3.5–5.2)
SODIUM: 134 mmol/L (ref 134–144)
TOTAL PROTEIN: 6 g/dL (ref 6.0–8.5)

## 2017-02-28 LAB — CBC WITH DIFFERENTIAL (CANCER CENTER ONLY)
BASO#: 0 10*3/uL (ref 0.0–0.2)
BASO%: 0.6 % (ref 0.0–2.0)
EOS%: 1.2 % (ref 0.0–7.0)
Eosinophils Absolute: 0.1 10*3/uL (ref 0.0–0.5)
HEMATOCRIT: 29 % — AB (ref 34.8–46.6)
HEMOGLOBIN: 9.1 g/dL — AB (ref 11.6–15.9)
LYMPH#: 0.9 10*3/uL (ref 0.9–3.3)
LYMPH%: 17.1 % (ref 14.0–48.0)
MCH: 26.4 pg (ref 26.0–34.0)
MCHC: 31.4 g/dL — ABNORMAL LOW (ref 32.0–36.0)
MCV: 84 fL (ref 81–101)
MONO#: 0.4 10*3/uL (ref 0.1–0.9)
MONO%: 7.6 % (ref 0.0–13.0)
NEUT%: 73.5 % (ref 39.6–80.0)
NEUTROS ABS: 3.7 10*3/uL (ref 1.5–6.5)
Platelets: 185 10*3/uL (ref 145–400)
RBC: 3.45 10*6/uL — AB (ref 3.70–5.32)
RDW: 17.3 % — AB (ref 11.1–15.7)
WBC: 5 10*3/uL (ref 3.9–10.0)

## 2017-02-28 LAB — URINALYSIS, MICROSCOPIC (CHCC SATELLITE)
Bilirubin (Urine): NEGATIVE
Blood: NEGATIVE
Glucose: NEGATIVE mg/dL
KETONES: NEGATIVE mg/dL
Nitrite: POSITIVE
Specific Gravity, Urine: 1.025 (ref 1.003–1.035)
UROBILINOGEN UR: 0.2 mg/dL (ref 0.2–1)
pH: 6 (ref 4.60–8.00)

## 2017-02-28 MED ORDER — SODIUM CHLORIDE 0.9% FLUSH
10.0000 mL | INTRAVENOUS | Status: DC | PRN
  Administered 2017-02-28: 10 mL via INTRAVENOUS
  Filled 2017-02-28: qty 10

## 2017-02-28 MED ORDER — HEPARIN SOD (PORK) LOCK FLUSH 100 UNIT/ML IV SOLN
500.0000 [IU] | Freq: Once | INTRAVENOUS | Status: AC
  Administered 2017-02-28: 500 [IU] via INTRAVENOUS
  Filled 2017-02-28: qty 5

## 2017-02-28 MED ORDER — HYDROCORTISONE 10 MG PO TABS: 10.0000 mg | ORAL_TABLET | Freq: Two times a day (BID) | ORAL | 1 refills | Status: DC

## 2017-02-28 NOTE — Progress Notes (Signed)
Hematology and Oncology Follow Up Visit  Brooke Rios [id number] 08-29-1925 83 y.o. 02/28/2017   Principle Diagnosis:  Metastatic  RIGHT renal cell carcinoma-progressive Paroxysmal atrial fibrillation  Current Therapy:   Votrient 400 mg by mouth daily - stopped December 2017 Nivolumab '240mg'$  IV q 2 week - start 10/30/2016 - s/p c#3 Status post stereotactic radiosurgery for solitary brain met ELIQUIS 2.5 mg by mouth twice a day Xgeva 120 mg subcutaneous every month   Interim History:  Brooke Rios is here today for follow-up with c/o fatigue and SOB with any exertion. She feels that this started after she finished her steroid. She would like to go back on a low dose steroid daily.  He daughter states that her urine has a strong odor and is dark. UA was positive for UTI. We will get her on an antibiotic.  She had a stereotactic right frontal craniotomy to resect the tumor 2 weeks ago. She tolerated the procedure well and her right frontal incision site has healed nicely. No redness or s/s of infection.  No fever, chills, n/v, cough, rash, headache, chest pain, palpitations, abdominal pain or changes in bowel habits.   No swelling, tenderness, numbness or tingling in her extremities. No c/o pain at this time. She is ambulating nicely with a walker for support. No falls or syncopal episodes.  No episodes of bleeding. She is on Eliquis and bruises easily with extensive bruising on her arms and legs.  She has maintained a good appetite and is staying hydrated. Her weight is stable.   ECOG Performance Status: 1 - Symptomatic but completely ambulatory  Medications:  Allergies as of 02/28/2017      Reactions   Cefpodoxime Other (See Comments)   Nocturia & disturbed sleep   Codeine Nausea Only      Medication List       Accurate as of 02/28/17  5:10 PM. Always use your most recent med list.          acetaminophen 500 MG tablet Commonly known as:  TYLENOL Take 500 mg by mouth every 6  (six) hours as needed (for pain.).   apixaban 2.5 MG Tabs tablet Commonly known as:  ELIQUIS Take 1 tablet (2.5 mg total) by mouth 2 (two) times daily.   [facility] MELATONIN 5 MG Tabs Generic drug:  Melatonin Take 5 mg by mouth at bedtime as needed (for sleep.).   docusate sodium 100 MG capsule Commonly known as:  COLACE Take 100 mg by mouth 2 (two) times daily.   hydrocortisone 10 MG tablet Commonly known as:  CORTEF Take 1 tablet (10 mg total) by mouth 2 (two) times daily.   isosorbide mononitrate 30 MG 24 hr tablet Commonly known as:  IMDUR TAKE 1 TABLET BY MOUTH TWICE A DAY   levETIRAcetam 500 MG tablet Commonly known as:  KEPPRA Take 1 tablet (500 mg total) by mouth 2 (two) times daily.   lidocaine-prilocaine cream Commonly known as:  EMLA Apply 1 application topically as needed.   metoprolol succinate 50 MG 24 hr tablet Commonly known as:  TOPROL-XL Take 1 tablet (50 mg total) by mouth every morning. Take with or immediately following a meal.   nitroGLYCERIN 0.4 MG SL tablet Commonly known as:  NITROSTAT Place 1 tablet (0.4 mg total) under the tongue every 5 (five) minutes as needed for chest pain (MAX 3 TABLETS).   pantoprazole 40 MG tablet Commonly known as:  PROTONIX Take 1 tablet (40 mg total) by mouth daily.  polyethylene glycol packet Commonly known as:  MIRALAX / GLYCOLAX Take 17 g by mouth daily as needed (constipation). Mix in 8 oz liquid and drink   polyvinyl alcohol 1.4 % ophthalmic solution Commonly known as:  LIQUIFILM TEARS Place 1 drop into both eyes every hour as needed for dry eyes.   temazepam 15 MG capsule Commonly known as:  RESTORIL TAKE 1 CAPSULE BY MOUTH AT BEDTIME AS NEEDED FOR INSOMNIA MAY REPEAT X 1 DOSE   vitamin E 400 UNIT capsule Take 400 IU daily x 1 week, then 400 IU BID       Allergies:  Allergies  Allergen Reactions  . Cefpodoxime Other (See Comments)    Nocturia & disturbed sleep  . Codeine Nausea Only    Past  Medical History, Surgical history, Social history, and Family History were reviewed and updated.  Review of Systems: All other 10 point review of systems is negative.   Physical Exam:  weight is 182 lb 1.9 oz (82.6 kg). Her oral temperature is 97.6 F (36.4 C). Her blood pressure is 123/56 (abnormal) and her pulse is 74. Her respiration is 20 and oxygen saturation is 99%.   Wt Readings from Last 3 Encounters:  02/28/17 182 lb 1.9 oz (82.6 kg)  02/12/17 180 lb (81.6 kg)  02/07/17 180 lb 9.6 oz (81.9 kg)    Ocular: Sclerae unicteric, pupils equal, round and reactive to light Ear-nose-throat: Oropharynx clear, dentition fair Lymphatic: No cervical, supraclavicular or axillary adenopathy Lungs no rales or rhonchi, good excursion bilaterally Heart regular rate and rhythm, no murmur appreciated Abd soft, nontender, positive bowel sounds, no liver or spleen tip palpated on exam, no fluid wave MSK no focal spinal tenderness, no joint edema Neuro: non-focal, well-oriented, appropriate affect Breasts: Deferred   Lab Results  Component Value Date   WBC 5.0 02/28/2017   HGB 9.1 (L) 02/28/2017   HCT 29.0 (L) 02/28/2017   MCV 84 02/28/2017   PLT 185 02/28/2017   Lab Results  Component Value Date   FERRITIN 1,024 (H) 11/13/2016   IRON 40 (L) 11/13/2016   TIBC 261 11/13/2016   UIBC 221 11/13/2016   IRONPCTSAT 15 (L) 11/13/2016   Lab Results  Component Value Date   RBC 3.45 (L) 02/28/2017   No results found for: KPAFRELGTCHN, LAMBDASER, KAPLAMBRATIO No results found for: IGGSERUM, IGA, IGMSERUM No results found for: Georgann Housekeeper, MSPIKE, SPEI   Chemistry      Component Value Date/Time   NA 138 02/13/2017 0550   NA 139 01/17/2017 0936   NA 137 08/08/2016 0948   K 4.7 02/13/2017 0550   K 4.2 01/17/2017 0936   K 4.2 08/08/2016 0948   CL 106 02/13/2017 0550   CL 100 01/17/2017 0936   CO2 21 (L) 02/13/2017 0550   CO2 25 01/17/2017  0936   CO2 23 08/08/2016 0948   BUN 27 (H) 02/13/2017 0550   BUN 35 (H) 01/17/2017 0936   BUN 40.4 (H) 08/08/2016 0948   CREATININE 1.10 (H) 02/13/2017 0550   CREATININE 1.3 (H) 01/17/2017 0936   CREATININE 1.4 (H) 08/08/2016 0948      Component Value Date/Time   CALCIUM 7.4 (L) 02/13/2017 0550   CALCIUM 8.8 01/17/2017 0936   CALCIUM 9.6 08/08/2016 0948   ALKPHOS 86 (H) 01/17/2017 0936   ALKPHOS 99 08/08/2016 0948   AST 21 01/17/2017 0936   AST 11 08/08/2016 0948   ALT 34 01/17/2017 0936   ALT 26  08/08/2016 0948   BILITOT 0.80 01/17/2017 0936   BILITOT 0.52 08/08/2016 0948      Impression and Plan: Brooke Rios is an 83 yo white female with metastatic clear renal cell carcinoma (right kidney primary), lung and brain metastasis. The solitary brain met was treated with stereotactic radiosurgery in August 2017. She had a stereotactic right frontal craniotomy to resect the tumor 2 weeks ago and tolerated this well. Her incision has healed nicely.  She is here today with c/o fatigued and SOB with exertion since stopping her steroid. We will have her start hydrocortisone 10 mg PO BID daily.  Her UA was positive for UTI and culture is pending. We will go ahead and start her on Bactrim DS PO BID for 3 days. She has her current treatment and appointment schedule and we will see her back next week for follow-up with Dr. Marin Olp and Delton See.  I spent 25 minutes face to face counseling the patient and her daughter.   They will contact our office with any questions or concerns. We can certainly see her sooner if need be.    Brooke Bottom, NP 4/12/20185:10 PM

## 2017-03-01 ENCOUNTER — Other Ambulatory Visit: Payer: Self-pay | Admitting: *Deleted

## 2017-03-01 LAB — LACTATE DEHYDROGENASE: LDH: 262 U/L — ABNORMAL HIGH (ref 125–245)

## 2017-03-01 MED ORDER — SULFAMETHOXAZOLE-TRIMETHOPRIM 800-160 MG PO TABS: 1.0000 | ORAL_TABLET | Freq: Two times a day (BID) | ORAL | 0 refills | Status: DC

## 2017-03-04 ENCOUNTER — Other Ambulatory Visit: Payer: Self-pay | Admitting: Hematology & Oncology

## 2017-03-04 ENCOUNTER — Inpatient Hospital Stay: Payer: Medicare Other

## 2017-03-04 ENCOUNTER — Other Ambulatory Visit: Payer: Medicare Other

## 2017-03-04 ENCOUNTER — Ambulatory Visit: Payer: Medicare Other | Admitting: Hematology & Oncology

## 2017-03-04 DIAGNOSIS — C7801 Secondary malignant neoplasm of right lung: Principal | ICD-10-CM

## 2017-03-04 DIAGNOSIS — C649 Malignant neoplasm of unspecified kidney, except renal pelvis: Secondary | ICD-10-CM

## 2017-03-04 DIAGNOSIS — I482 Chronic atrial fibrillation, unspecified: Secondary | ICD-10-CM

## 2017-03-04 DIAGNOSIS — R410 Disorientation, unspecified: Secondary | ICD-10-CM

## 2017-03-04 LAB — URINE CULTURE

## 2017-03-05 ENCOUNTER — Telehealth: Payer: Self-pay | Admitting: Internal Medicine

## 2017-03-05 ENCOUNTER — Ambulatory Visit (INDEPENDENT_AMBULATORY_CARE_PROVIDER_SITE_OTHER): Payer: Medicare Other | Admitting: Internal Medicine

## 2017-03-05 ENCOUNTER — Encounter: Payer: Self-pay | Admitting: Internal Medicine

## 2017-03-05 ENCOUNTER — Other Ambulatory Visit: Payer: Self-pay | Admitting: Cardiology

## 2017-03-05 VITALS — BP 126/68 | HR 81 | Ht 66.0 in | Wt 186.4 lb

## 2017-03-05 DIAGNOSIS — C7801 Secondary malignant neoplasm of right lung: Secondary | ICD-10-CM

## 2017-03-05 DIAGNOSIS — C649 Malignant neoplasm of unspecified kidney, except renal pelvis: Secondary | ICD-10-CM

## 2017-03-05 DIAGNOSIS — I495 Sick sinus syndrome: Secondary | ICD-10-CM

## 2017-03-05 LAB — CUP PACEART INCLINIC DEVICE CHECK
Battery Impedance: 443 Ohm
Battery Voltage: 2.78 V
Brady Statistic AP VS Percent: 78.5 %
Implantable Lead Implant Date: 20110608
Implantable Lead Model: 4470
Implantable Lead Serial Number: 480470
Implantable Pulse Generator Implant Date: 20110608
Lead Channel Impedance Value: 377 Ohm
Lead Channel Pacing Threshold Amplitude: 0.5 V
Lead Channel Pacing Threshold Amplitude: 0.5 V
Lead Channel Pacing Threshold Pulse Width: 0.4 ms
Lead Channel Pacing Threshold Pulse Width: 0.4 ms
Lead Channel Setting Pacing Amplitude: 2 V
Lead Channel Setting Sensing Sensitivity: 2.8 mV
MDC IDC LEAD IMPLANT DT: 20110608
MDC IDC LEAD LOCATION: 753859
MDC IDC LEAD LOCATION: 753860
MDC IDC LEAD SERIAL: 672291
MDC IDC MSMT BATTERY REMAINING LONGEVITY: 90 mo
MDC IDC MSMT LEADCHNL RA SENSING INTR AMPL: 5.6 mV
MDC IDC MSMT LEADCHNL RV IMPEDANCE VALUE: 402 Ohm
MDC IDC MSMT LEADCHNL RV SENSING INTR AMPL: 5.6 mV
MDC IDC SESS DTM: 20180417150444
MDC IDC SET LEADCHNL RV PACING AMPLITUDE: 2.5 V
MDC IDC SET LEADCHNL RV PACING PULSEWIDTH: 0.4 ms
MDC IDC STAT BRADY AP VP PERCENT: 4.3 %
MDC IDC STAT BRADY AS VP PERCENT: 0.3 %
MDC IDC STAT BRADY AS VS PERCENT: 16.8 %

## 2017-03-05 NOTE — Patient Instructions (Signed)
Your physician recommends that you continue on your current medications as directed. Please refer to the Current Medication list given to you today. --please call our office if there this medication list is inaccurate.--  Remote monitoring is used to monitor your Pacemaker of ICD from home. This monitoring reduces the number of office visits required to check your device to one time per year. It allows [place] to keep an eye on the functioning of your device to ensure it is working properly. You are scheduled for a device check from home on 06/04/17. You may send your transmission at any time that day. If you have a wireless device, the transmission will be sent automatically. After your physician reviews your transmission, you will receive a postcard with your next transmission date.  Your physician wants you to follow-up in: 12 months with Dr. Lovena Le.  You will receive a reminder letter in the mail two months in advance. If you don't receive a letter, please call our office to schedule the follow-up appointment.

## 2017-03-05 NOTE — Telephone Encounter (Signed)
Spoke w/ pt daughter and informed her that pt PPM will be checked today at her appt w/ [facility]. Pt daughter verbalized understanding.

## 2017-03-05 NOTE — Progress Notes (Signed)
HPI Brooke Rios returns today for followup. She is a very pleasant 83 -year-old woman, with a history of symptomatic bradycardia,  hypertension, status post permanent pacemaker insertion. In the interim, the patient has been stable. She has been diagnosed with metastatic renal cell CA. She has undergone resection of a brain met and chemotherapy. She feels well. She has minimal palpitations. She has been bothered by recurrent UTI's. Allergies  Allergen Reactions  . Cefpodoxime Other (See Comments)    Nocturia & disturbed sleep  . Codeine Nausea Only     Current Outpatient Prescriptions  Medication Sig Dispense Refill  . acetaminophen (TYLENOL) 500 MG tablet Take 500 mg by mouth every 6 (six) hours as needed (for pain.).     Marland Kitchen [facility] MELATONIN 5 MG TABS Take 5 mg by mouth at bedtime as needed (for sleep.).   2  . docusate sodium (COLACE) 100 MG capsule Take 100 mg by mouth 2 (two) times daily.    Marland Kitchen ELIQUIS 2.5 MG TABS tablet TAKE 1 TABLET (2.5 MG TOTAL) BY MOUTH 2 (TWO) TIMES DAILY. 180 tablet 2  . hydrocortisone (CORTEF) 10 MG tablet Take 1 tablet (10 mg total) by mouth 2 (two) times daily. 60 tablet 1  . isosorbide mononitrate (IMDUR) 30 MG 24 hr tablet TAKE 1 TABLET BY MOUTH TWICE A DAY 90 tablet 0  . levETIRAcetam (KEPPRA) 500 MG tablet Take 1 tablet (500 mg total) by mouth 2 (two) times daily. 120 tablet 0  . lidocaine-prilocaine (EMLA) cream Apply 1 application topically daily as needed (prior to port begin access).    . metoprolol succinate (TOPROL-XL) 50 MG 24 hr tablet Take 1 tablet (50 mg total) by mouth every morning. Take with or immediately following a meal. 90 tablet 3  . nitroGLYCERIN (NITROSTAT) 0.4 MG SL tablet Place 1 tablet (0.4 mg total) under the tongue every 5 (five) minutes as needed for chest pain (MAX 3 TABLETS). 25 tablet 1  . pantoprazole (PROTONIX) 40 MG tablet TAKE 1 TABLET (40 MG TOTAL) BY MOUTH DAILY. 30 tablet 2  . polyethylene glycol (MIRALAX / GLYCOLAX) packet Take  17 g by mouth daily as needed (constipation). Mix in 8 oz liquid and drink     . polyvinyl alcohol (LIQUIFILM TEARS) 1.4 % ophthalmic solution Place 1 drop into both eyes every hour as needed for dry eyes. 15 mL 0  . sulfamethoxazole-trimethoprim (BACTRIM DS,SEPTRA DS) 800-160 MG tablet Take 1 tablet by mouth 2 (two) times daily. 6 tablet 0  . temazepam (RESTORIL) 15 MG capsule TAKE 1 CAPSULE BY MOUTH AT BEDTIME AS NEEDED FOR INSOMNIA MAY REPEAT X 1 DOSE  2  . vitamin E 400 UNIT capsule Take 400 Units by mouth as directed. Take 1 capsule daily for one week then start taking 1 capsule twice daily     No current facility-administered medications for this visit.    Facility-Administered Medications Ordered in Other Visits  Medication Dose Route Frequency Provider Last Rate Last Dose  . sodium chloride flush (NS) 0.9 % injection 10 mL  10 mL Intravenous PRN Eliezer Bottom, NP   10 mL at 12/26/16 1447     Past Medical History:  Diagnosis Date  . Chronic kidney disease 2011   creat 1.5   . Colonic polyp   . Coronary artery disease    Moderate three vessel obstructive coronary disease  . Diverticulosis of colon   . E. coli UTI 02/2013   uniformly sensitive  . Goals of care, counseling/discussion  12/26/2016  . Gout   . History of radiation therapy 07/09/2016   SRS treatment to Right frontal lobe brain metastatis  . Hyperlipidemia   . Hypertension   . Metastatic renal cell carcinoma to bone ([facility] [facility]) 12/26/2016  . Metastatic renal cell carcinoma to brain ([place]) 07/09/2016  . Metastatic renal cell carcinoma to lung ([place]) 07/09/2016  . Myocardial infarction ([place]) 2012  . Osteoarthritis   . Sick sinus syndrome ([facility])    with paroxysmal atrial fibrillation  . Vertigo     ROS:   All systems reviewed and negative except as noted in the HPI.   Past Surgical History:  Procedure Laterality Date  . ABDOMINAL HYSTERECTOMY    . APPENDECTOMY     done at [facility]  . APPLICATION OF CRANIAL NAVIGATION  Right 02/12/2017   Procedure: APPLICATION OF CRANIAL NAVIGATION;  Surgeon: Consuella Lose, MD;  Location: [facility];  Service: Neurosurgery;  Laterality: Right;  . BREAST BIOPSY Left    x 1  . BREAST LUMPECTOMY Left 2009   Dr Margot Chimes  . CHOLECYSTECTOMY     age 53  . CHOLECYSTECTOMY    . COLONOSCOPY    . CRANIOTOMY Right 02/12/2017   Procedure: STERIOTACTIC RIGHT FRONTAL CRANIOTOMY, RESECTION OF TUMOR;  Surgeon: Consuella Lose, MD;  Location: [address];  Service: Neurosurgery;  Laterality: Right;  STERIOTACTIC RIGHT FRONTAL CRANIOTOMY, RESECTION OF TUMOR  . IR GENERIC HISTORICAL  10/29/2016   IR FLUORO GUIDE PORT INSERTION RIGHT 10/29/2016 Greggory Keen, MD [facility]  . IR GENERIC HISTORICAL  10/29/2016   IR US GUIDE VASC ACCESS RIGHT 10/29/2016 Greggory Keen, MD [facility]  . OOPHORECTOMY     bilat for fibroids @ age 36, anemia pre op due to dysfunctional menses yo  . PACEMAKER INSERTION  2011  . PAF induced MI, s/p pacer for Brady-tach syndrome  04/2010   Dr.jordan  . TONSILLECTOMY       Family History  Problem Relation Age of Onset  . Hyperlipidemia Father   . Stroke Father     TIA,CVA  . Heart failure Father   . Cancer Mother     ? renal primary  . Breast cancer Sister     11/08  . Breast cancer Maternal Aunt   . Colon cancer Maternal Aunt   . Breast cancer Daughter     lumpectomy 2007;mastectomy 2013  . Heart disease Brother     pacer  . Diabetes Neg Hx      Social History   Social History  . Marital status: Widowed    Spouse name: N/A  . Number of children: 2  . Years of education: N/A   Occupational History  . retired    Social History Main Topics  . Smoking status: Never Smoker  . Smokeless tobacco: Never Used  . Alcohol use No  . Drug use: No  . Sexual activity: Not Currently   Other Topics Concern  . Not on file   Social History Narrative   Regular exercise- no      BP 126/68   Pulse 81   Ht _0  (1.676 m)   Wt 186 lb 6.4 oz (84.6  kg)   SpO2 98%   BMI 30.09 kg/m   Physical Exam:  Well appearing elderly woman, NAD HEENT: Unremarkable Neck:  No JVD, no thyromegally Lungs:  Clear with no wheezes, rales, or rhonchi. HEART:  Regular rate rhythm, no murmurs, no rubs, no clicks Abd:  soft, positive bowel sounds, no organomegally, no  rebound, no guarding Ext:  2 plus pulses, no edema, no cyanosis, no clubbing Skin:  No rashes no nodules Neuro:  CN II through XII intact, motor grossly intact  DEVICE  Normal device function.  See PaceArt for details.   Assess/Plan:  1. Sinus node dysfunction - she is stable after her PPM insertion 2. PAF - she is asymptomatic and tolerating anti-coagulation 3. Renal cell CA - she is undergoing chemotherapy 4. HTN heart disease - her blood pressure is controlled. Will follow.  Mikle Bosworth.D.

## 2017-03-05 NOTE — Telephone Encounter (Signed)
New message    Pt daughter is calling to say that they could not do a home transmission for pt because she was in and out of the hospital. She wants to know if it was needed for her appt with Dr. Lovena Le.

## 2017-03-06 ENCOUNTER — Ambulatory Visit (HOSPITAL_BASED_OUTPATIENT_CLINIC_OR_DEPARTMENT_OTHER): Payer: Medicare Other

## 2017-03-06 ENCOUNTER — Other Ambulatory Visit: Payer: Self-pay | Admitting: Family

## 2017-03-06 VITALS — BP 118/54 | HR 77 | Temp 97.6°F | Resp 19

## 2017-03-06 DIAGNOSIS — C7800 Secondary malignant neoplasm of unspecified lung: Principal | ICD-10-CM

## 2017-03-06 DIAGNOSIS — N3000 Acute cystitis without hematuria: Secondary | ICD-10-CM

## 2017-03-06 DIAGNOSIS — C641 Malignant neoplasm of right kidney, except renal pelvis: Secondary | ICD-10-CM

## 2017-03-06 DIAGNOSIS — R5383 Other fatigue: Secondary | ICD-10-CM

## 2017-03-06 DIAGNOSIS — C7951 Secondary malignant neoplasm of bone: Secondary | ICD-10-CM

## 2017-03-06 DIAGNOSIS — C649 Malignant neoplasm of unspecified kidney, except renal pelvis: Secondary | ICD-10-CM

## 2017-03-06 DIAGNOSIS — C7931 Secondary malignant neoplasm of brain: Secondary | ICD-10-CM

## 2017-03-06 MED ORDER — DENOSUMAB 120 MG/1.7ML ~~LOC~~ SOLN
120.0000 mg | Freq: Once | SUBCUTANEOUS | Status: AC
Start: 2017-03-06 — End: 2017-03-06
  Administered 2017-03-06: 120 mg via SUBCUTANEOUS
  Filled 2017-03-06: qty 1.7

## 2017-03-06 MED ORDER — SULFAMETHOXAZOLE-TRIMETHOPRIM 800-160 MG PO TABS: 1.0000 | ORAL_TABLET | Freq: Two times a day (BID) | ORAL | 0 refills | Status: DC

## 2017-03-06 NOTE — Progress Notes (Signed)
Advised they follow-up with PCP for urology referral for frequent UTI. UA was positive earlier this week for UTI. Cx did not grow anything significant.

## 2017-03-06 NOTE — Patient Instructions (Signed)

## 2017-03-13 ENCOUNTER — Ambulatory Visit (INDEPENDENT_AMBULATORY_CARE_PROVIDER_SITE_OTHER): Payer: Medicare Other | Admitting: Family Medicine

## 2017-03-13 VITALS — BP 118/62 | HR 80 | Temp 97.8°F | Ht 66.0 in | Wt 180.8 lb

## 2017-03-13 DIAGNOSIS — N390 Urinary tract infection, site not specified: Secondary | ICD-10-CM | POA: Diagnosis not present

## 2017-03-13 DIAGNOSIS — R350 Frequency of micturition: Secondary | ICD-10-CM | POA: Diagnosis not present

## 2017-03-13 LAB — POC URINALSYSI DIPSTICK (AUTOMATED)
BILIRUBIN UA: NEGATIVE
GLUCOSE UA: NEGATIVE
KETONES UA: NEGATIVE
Nitrite, UA: POSITIVE
Protein, UA: NEGATIVE
RBC UA: NEGATIVE
UROBILINOGEN UA: 0.2 U/dL
pH, UA: 6 (ref 5.0–8.0)

## 2017-03-13 NOTE — Progress Notes (Addendum)
[facility] [address] [place], [place] [address] [phone]  Date:  03/13/2017   Name:  Brooke Rios   DOB:  1925/10/05   MRN:  [id number]  PCP:  Lamar Blinks, MD    Chief Complaint: Urinary Tract Infection (c/o increased urinary frequency for about a month. )   History of Present Illness:  Brooke Rios is a 83 y.o. very pleasant female patient who presents with the following:  They are concerned about a recent UTI and would like to make sure that her urine is clear.  She has had 2 UTI since the first of the year treated by her oncologist, Dr. Marin Olp Her symptoms are generally increased weakness and urine looking dark, bad urine smell She is actually not having sx right now- she did abx on 4/12 per Dr, Marin Olp She was treated with Bactrim.  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  She had a small craniotomy to resect a tumor 2 weeks ago and has done well  Patient Active Problem List   Diagnosis Date Noted  . Brain tumor ([place]) 02/12/2017  . Goals of care, counseling/discussion 12/26/2016  . Metastatic renal cell carcinoma to bone ([place]) 12/26/2016  . Acute blood loss anemia 10/08/2016  . Fall 10/06/2016  . Scalp laceration, initial encounter 10/06/2016  . CKD (chronic kidney disease) stage 3, GFR 30-59 ml/min 10/06/2016  . Metastatic renal cell carcinoma to lung ([place]) 07/09/2016  . Metastatic renal cell carcinoma to brain ([place]) 07/09/2016  . Widespread metastatic malignant neoplastic disease ([place]) 06/06/2016  . Elevated TSH 08/19/2015  . Vertigo 06/21/2014  . Vomiting 06/21/2014  . History of anemia 09/08/2013  . History of gout 05/22/2013  . Pacemaker-Medtronic 09/10/2012  . Unspecified adverse effect of unspecified drug, medicinal and  biological substance 06/12/2012  . Hyperglycemia 06/12/2012  . Tachy-brady syndrome ([place]) 02/05/2012  . Atrial fibrillation ([place]) 09/11/2011  . Coronary artery disease   . HIP PAIN 10/17/2010  . DIZZINESS 11/22/2009  . RENAL DISEASE, CHRONIC, MILD 06/08/2009  . BENIGN PAROXYSMAL POSITIONAL VERTIGO 12/30/2008  . BRADYCARDIA, CHRONIC 12/30/2008  . DIVERTICULOSIS, COLON 12/30/2008  . OSTEOARTHRITIS 12/30/2008  . COLONIC POLYPS, HX OF 12/30/2008  . Hyperlipidemia 12/24/2007  . Thrombocytopenia ([place]) 12/24/2007  . Essential hypertension 12/24/2007  . FIBROCYSTIC BREAST DISEASE 12/24/2007    Past Medical History:  Diagnosis Date  . Chronic kidney disease 2011   creat 1.5   . Colonic polyp   . Coronary artery disease    Moderate three vessel obstructive coronary disease  . Diverticulosis of colon   . E. coli UTI 02/2013   uniformly sensitive  . Goals of care, counseling/discussion 12/26/2016  . Gout   . History of radiation therapy 07/09/2016   SRS treatment to Right frontal lobe brain metastatis  . Hyperlipidemia   . Hypertension   . Metastatic renal cell carcinoma to bone ([place]) 12/26/2016  . Metastatic renal cell carcinoma to brain ([place]) 07/09/2016  . Metastatic renal cell carcinoma to lung ([place]) 07/09/2016  . Myocardial infarction ([place]) 2012  . Osteoarthritis   . Sick sinus syndrome ([facility])    with paroxysmal atrial fibrillation  . Vertigo     Past Surgical History:  Procedure Laterality Date  . ABDOMINAL HYSTERECTOMY    .  APPENDECTOMY     done at [facility]  . APPLICATION OF CRANIAL NAVIGATION Right 02/12/2017   Procedure: APPLICATION OF CRANIAL NAVIGATION;  Surgeon: Consuella Lose, MD;  Location: [facility];  Service: Neurosurgery;  Laterality: Right;  . BREAST BIOPSY Left    x 1  . BREAST LUMPECTOMY Left 2009   Dr Margot Chimes  . CHOLECYSTECTOMY     age 88  . CHOLECYSTECTOMY    . COLONOSCOPY    . CRANIOTOMY Right 02/12/2017   Procedure: STERIOTACTIC RIGHT FRONTAL CRANIOTOMY,  RESECTION OF TUMOR;  Surgeon: Consuella Lose, MD;  Location: [facility];  Service: Neurosurgery;  Laterality: Right;  STERIOTACTIC RIGHT FRONTAL CRANIOTOMY, RESECTION OF TUMOR  . IR GENERIC HISTORICAL  10/29/2016   IR FLUORO GUIDE PORT INSERTION RIGHT 10/29/2016 Greggory Keen, MD [facility]  . IR GENERIC HISTORICAL  10/29/2016   IR US GUIDE VASC ACCESS RIGHT 10/29/2016 Greggory Keen, MD [facility]  . OOPHORECTOMY     bilat for fibroids @ age 62, anemia pre op due to dysfunctional menses yo  . PACEMAKER INSERTION  2011  . PAF induced MI, s/p pacer for Brady-tach syndrome  04/2010   Dr.jordan  . TONSILLECTOMY      Social History  Substance Use Topics  . Smoking status: Never Smoker  . Smokeless tobacco: Never Used  . Alcohol use No    Family History  Problem Relation Age of Onset  . Hyperlipidemia Father   . Stroke Father     TIA,CVA  . Heart failure Father   . Cancer Mother     ? renal primary  . Breast cancer Sister     11/08  . Breast cancer Maternal Aunt   . Colon cancer Maternal Aunt   . Breast cancer Daughter     lumpectomy 2007;mastectomy 2013  . Heart disease Brother     pacer  . Diabetes Neg Hx     Allergies  Allergen Reactions  . Cefpodoxime Other (See Comments)    Nocturia & disturbed sleep  . Codeine Nausea Only    Medication list has been reviewed and updated.  Current Outpatient Prescriptions on File Prior to Visit  Medication Sig Dispense Refill  . acetaminophen (TYLENOL) 500 MG tablet Take 500 mg by mouth every 6 (six) hours as needed (for pain.).     Marland Kitchen [employer] MELATONIN 5 MG TABS Take 5 mg by mouth at bedtime as needed (for sleep.).   2  . docusate sodium (COLACE) 100 MG capsule Take 100 mg by mouth 2 (two) times daily.    Marland Kitchen ELIQUIS 2.5 MG TABS tablet TAKE 1 TABLET (2.5 MG TOTAL) BY MOUTH 2 (TWO) TIMES DAILY. 180 tablet 2  . hydrocortisone (CORTEF) 10 MG tablet Take 1 tablet (10 mg total) by mouth 2 (two) times daily. 60 tablet 1  . isosorbide  mononitrate (IMDUR) 30 MG 24 hr tablet TAKE 1 TABLET BY MOUTH TWICE A DAY 90 tablet 0  . lidocaine-prilocaine (EMLA) cream Apply 1 application topically daily as needed (prior to port begin access).    . metoprolol succinate (TOPROL-XL) 50 MG 24 hr tablet Take 1 tablet (50 mg total) by mouth every morning. Take with or immediately following a meal. 90 tablet 3  . nitroGLYCERIN (NITROSTAT) 0.4 MG SL tablet Place 1 tablet (0.4 mg total) under the tongue every 5 (five) minutes as needed for chest pain (MAX 3 TABLETS). 25 tablet 1  . pantoprazole (PROTONIX) 40 MG tablet TAKE 1 TABLET (40 MG TOTAL) BY MOUTH DAILY.  30 tablet 2  . polyethylene glycol (MIRALAX / GLYCOLAX) packet Take 17 g by mouth daily as needed (constipation). Mix in 8 oz liquid and drink     . polyvinyl alcohol (LIQUIFILM TEARS) 1.4 % ophthalmic solution Place 1 drop into both eyes every hour as needed for dry eyes. 15 mL 0  . temazepam (RESTORIL) 15 MG capsule TAKE 1 CAPSULE BY MOUTH AT BEDTIME AS NEEDED FOR INSOMNIA MAY REPEAT X 1 DOSE  2  . vitamin E 400 UNIT capsule Take 400 Units by mouth as directed. Take 1 capsule daily for one week then start taking 1 capsule twice daily     Current Facility-Administered Medications on File Prior to Visit  Medication Dose Route Frequency Provider Last Rate Last Dose  . sodium chloride flush (NS) 0.9 % injection 10 mL  10 mL Intravenous PRN Eliezer Bottom, NP   10 mL at 12/26/16 1447    Review of Systems:  As per HPI- otherwise negative.   Physical Examination: Vitals:   03/13/17 1132  BP: 118/62  Pulse: 80  Temp: 97.8 F (36.6 C)   Vitals:   03/13/17 1132  Weight: 180 lb 12.8 oz (82 kg)  Height: '5\' 6"'$  (1.676 m)   Body mass index is 29.18 kg/m. Ideal Body Weight: Weight in (lb) to have BMI = 25: 154.6  GEN: WDWN, NAD, Non-toxic, A & O x 3, looks well, her hair is growing back from recent brain operation HEENT: Atraumatic, Normocephalic. Neck supple. No masses, No  LAD. Ears and Nose: No external deformity. CV: RRR, No M/G/R. No JVD. No thrill. No extra heart sounds. PULM: CTA B, no wheezes, crackles, rhonchi. No retractions. No resp. distress. No accessory muscle use. ABD: S, NT, ND, +BS. No rebound. No HSM.  Belly is benign EXTR: No c/c/.  Trace BLE edema NEURO Normal gait.  PSYCH: Normally interactive. Conversant. Not depressed or anxious appearing.  Calm demeanor.     Assessment and Plan: Urinary tract infection without hematuria, site unspecified - Plan: Urine culture  Frequent urination - Plan: POCT Urinalysis Dipstick (Automated), Urine culture  Frequent UTI - Plan: Urine culture  Concern about if her recent UTI is cleared up.  Will obtain a culture today and be in touch with them asap    Signed Lamar Blinks, MD They do use mychart  Received her urine culture on 4/29- she has 2 bugs, both sensitive to cipro and levaquin.  levaquin does not appear to interact with her eliquis so choose this medication  Called her daughter [name] and explained- she states understanding.  Explained that we do not prefer quinolones in seniors but do not have much choice in this situation    Results for orders placed or performed in visit on 03/13/17  Urine culture  Result Value Ref Range   Culture KLEBSIELLA PNEUMONIAE ESCHERICHIA COLI     Colony Count Greater than 100,000 CFU/mL    Organism ID, Bacteria KLEBSIELLA PNEUMONIAE    Colony Count 50,000-100,000 CFU/mL    Organism ID, Bacteria ESCHERICHIA COLI       Susceptibility   Escherichia coli -  (no method available)    AMPICILLIN >=32 Resistant     AMOX/CLAVULANIC 16 Intermediate     AMPICILLIN/SULBACTAM >=32 Resistant     PIP/TAZO <=4 Sensitive     IMIPENEM <=0.25 Sensitive     CEFAZOLIN 8 Resistant     CEFTRIAXONE <=1 Sensitive     CEFTAZIDIME <=1 Sensitive     CEFEPIME <=  1 Sensitive     GENTAMICIN >=16 Resistant     TOBRAMYCIN 8 Intermediate     CIPROFLOXACIN 1 Sensitive      LEVOFLOXACIN 1 Sensitive     NITROFURANTOIN <=16 Sensitive     TRIMETH/SULFA* >=320 Resistant      * NR=NOT REPORTABLE,SEE COMMENTORAL therapy:A cefazolin MIC of <32 predicts susceptibility to the oral agents cefaclor,cefdinir,cefpodoxime,cefprozil,cefuroxime,cephalexin,and loracarbef when used for therapy of uncomplicated UTIs due to E.coli,K.pneumomiae,and P.mirabilis. PARENTERAL therapy: A cefazolinMIC of >8 indicates resistance to parenteralcefazolin. An alternate test method must beperformed to confirm susceptibility to parenteralcefazolin.NR=NOT REPORTABLE,SEE COMMENTORAL therapy:A cefazolin MIC of <32 predicts susceptibility to the oral agents cefaclor,cefdinir,cefpodoxime,cefprozil,cefuroxime,cephalexin,and loracarbef when used for therapy of uncomplicated UTIs due to E.coli,K.pneumomiae,and P.mirabilis. PARENTERAL therapy: A cefazolinMIC of >8 indicates resistance to parenteralcefazolin. An alternate test method must beperformed to confirm susceptibility to parenteralcefazolin.   Klebsiella pneumoniae -  (no method available)    AMPICILLIN >=32 Resistant     AMOX/CLAVULANIC 8 Sensitive     AMPICILLIN/SULBACTAM >=32 Resistant     PIP/TAZO <=4 Sensitive     IMIPENEM <=0.25 Sensitive     CEFAZOLIN <=4 Not Reportable     CEFTRIAXONE <=1 Sensitive     CEFTAZIDIME <=1 Sensitive     CEFEPIME <=1 Sensitive     GENTAMICIN >=16 Resistant     TOBRAMYCIN 8 Intermediate     CIPROFLOXACIN <=0.25 Sensitive     LEVOFLOXACIN <=0.12 Sensitive     NITROFURANTOIN 32 Sensitive     TRIMETH/SULFA* >=320 Resistant      * NR=NOT REPORTABLE,SEE COMMENTORAL therapy:A cefazolin MIC of <32 predicts susceptibility to the oral agents cefaclor,cefdinir,cefpodoxime,cefprozil,cefuroxime,cephalexin,and loracarbef when used for therapy of uncomplicated UTIs due to E.coli,K.pneumomiae,and P.mirabilis. PARENTERAL therapy: A cefazolinMIC of >8 indicates resistance to parenteralcefazolin. An alternate test method must  beperformed to confirm susceptibility to parenteralcefazolin.  POCT Urinalysis Dipstick (Automated)  Result Value Ref Range   Color, UA yellow    Clarity, UA clear    Glucose, UA negative    Bilirubin, UA negative    Ketones, UA negative    Spec Grav, UA >=1.030 (A) 1.010 - 1.025   Blood, UA negative    pH, UA 6.0 5.0 - 8.0   Protein, UA negative    Urobilinogen, UA 0.2 0.2 or 1.0 E.U./dL   Nitrite, UA positive    Leukocytes, UA Moderate (2+) (A) Negative

## 2017-03-16 LAB — URINE CULTURE

## 2017-03-17 ENCOUNTER — Encounter: Payer: Self-pay | Admitting: Family Medicine

## 2017-03-17 MED ORDER — LEVOFLOXACIN 250 MG PO TABS: ORAL_TABLET | ORAL | 0 refills | Status: DC

## 2017-03-17 NOTE — Addendum Note (Signed)
Addended by: Lamar Blinks C on: 03/17/2017 02:58 PM   Modules accepted: Orders

## 2017-03-22 ENCOUNTER — Other Ambulatory Visit: Payer: Self-pay | Admitting: Cardiology

## 2017-03-28 ENCOUNTER — Encounter: Payer: Self-pay | Admitting: *Deleted

## 2017-04-08 ENCOUNTER — Other Ambulatory Visit (HOSPITAL_BASED_OUTPATIENT_CLINIC_OR_DEPARTMENT_OTHER): Payer: Medicare Other

## 2017-04-08 ENCOUNTER — Ambulatory Visit (HOSPITAL_BASED_OUTPATIENT_CLINIC_OR_DEPARTMENT_OTHER): Payer: Medicare Other | Admitting: Hematology & Oncology

## 2017-04-08 ENCOUNTER — Ambulatory Visit (HOSPITAL_BASED_OUTPATIENT_CLINIC_OR_DEPARTMENT_OTHER): Payer: Medicare Other

## 2017-04-08 ENCOUNTER — Ambulatory Visit: Payer: Medicare Other

## 2017-04-08 VITALS — BP 141/60 | HR 82 | Temp 98.0°F | Resp 18

## 2017-04-08 VITALS — BP 141/60 | HR 82 | Temp 98.0°F | Resp 18 | Wt 185.0 lb

## 2017-04-08 DIAGNOSIS — R5383 Other fatigue: Secondary | ICD-10-CM

## 2017-04-08 DIAGNOSIS — C7931 Secondary malignant neoplasm of brain: Secondary | ICD-10-CM

## 2017-04-08 DIAGNOSIS — C649 Malignant neoplasm of unspecified kidney, except renal pelvis: Secondary | ICD-10-CM

## 2017-04-08 DIAGNOSIS — C7800 Secondary malignant neoplasm of unspecified lung: Principal | ICD-10-CM

## 2017-04-08 DIAGNOSIS — C641 Malignant neoplasm of right kidney, except renal pelvis: Secondary | ICD-10-CM | POA: Diagnosis not present

## 2017-04-08 DIAGNOSIS — Z95828 Presence of other vascular implants and grafts: Secondary | ICD-10-CM

## 2017-04-08 DIAGNOSIS — C7951 Secondary malignant neoplasm of bone: Secondary | ICD-10-CM

## 2017-04-08 DIAGNOSIS — N3000 Acute cystitis without hematuria: Secondary | ICD-10-CM

## 2017-04-08 LAB — CMP (CANCER CENTER ONLY)
ALT(SGPT): 21 U/L (ref 10–47)
AST: 17 U/L (ref 11–38)
Albumin: 2.9 g/dL — ABNORMAL LOW (ref 3.3–5.5)
Alkaline Phosphatase: 74 U/L (ref 26–84)
BUN: 12 mg/dL (ref 7–22)
CHLORIDE: 101 meq/L (ref 98–108)
CO2: 28 meq/L (ref 18–33)
CREATININE: 1.5 mg/dL — AB (ref 0.6–1.2)
Calcium: 9.1 mg/dL (ref 8.0–10.3)
Glucose, Bld: 85 mg/dL (ref 73–118)
POTASSIUM: 4.4 meq/L (ref 3.3–4.7)
Sodium: 134 mEq/L (ref 128–145)
Total Bilirubin: 0.6 mg/dl (ref 0.20–1.60)
Total Protein: 6.3 g/dL — ABNORMAL LOW (ref 6.4–8.1)

## 2017-04-08 LAB — CBC WITH DIFFERENTIAL (CANCER CENTER ONLY)
BASO#: 0 10*3/uL (ref 0.0–0.2)
BASO%: 0.1 % (ref 0.0–2.0)
EOS%: 1.1 % (ref 0.0–7.0)
Eosinophils Absolute: 0.1 10*3/uL (ref 0.0–0.5)
HEMATOCRIT: 31.1 % — AB (ref 34.8–46.6)
HGB: 9.5 g/dL — ABNORMAL LOW (ref 11.6–15.9)
LYMPH#: 1.3 10*3/uL (ref 0.9–3.3)
LYMPH%: 18.2 % (ref 14.0–48.0)
MCH: 26 pg (ref 26.0–34.0)
MCHC: 30.5 g/dL — ABNORMAL LOW (ref 32.0–36.0)
MCV: 85 fL (ref 81–101)
MONO#: 0.5 10*3/uL (ref 0.1–0.9)
MONO%: 7.3 % (ref 0.0–13.0)
NEUT#: 5.4 10*3/uL (ref 1.5–6.5)
NEUT%: 73.3 % (ref 39.6–80.0)
Platelets: 257 10*3/uL (ref 145–400)
RBC: 3.65 10*6/uL — ABNORMAL LOW (ref 3.70–5.32)
RDW: 17.3 % — AB (ref 11.1–15.7)
WBC: 7.3 10*3/uL (ref 3.9–10.0)

## 2017-04-08 MED ORDER — DENOSUMAB 120 MG/1.7ML ~~LOC~~ SOLN
120.0000 mg | Freq: Once | SUBCUTANEOUS | Status: AC
  Administered 2017-04-08: 120 mg via SUBCUTANEOUS
  Filled 2017-04-08: qty 1.7

## 2017-04-08 MED ORDER — HEPARIN SOD (PORK) LOCK FLUSH 100 UNIT/ML IV SOLN
500.0000 [IU] | Freq: Once | INTRAVENOUS | Status: AC
  Administered 2017-04-08: 500 [IU] via INTRAVENOUS
  Filled 2017-04-08: qty 5

## 2017-04-08 MED ORDER — SODIUM CHLORIDE 0.9% FLUSH
10.0000 mL | INTRAVENOUS | Status: DC | PRN
  Administered 2017-04-08: 10 mL via INTRAVENOUS
  Filled 2017-04-08: qty 10

## 2017-04-08 NOTE — Progress Notes (Signed)
Hematology and Oncology Follow Up Visit  Brooke Rios [id number] October 05, 1925 83 y.o. 04/08/2017   Principle Diagnosis:   Metastatic  RIGHT renal cell carcinoma-progressive  Paroxysmal atrial fibrillation  Current Therapy:    Votrient 400 mg by mouth daily  Nivolumab 23m IV q 2 week - start 10/30/2016 - s/p c#3  Status post stereotactic radiosurgery for solitary brain met  ELIQUIS 2.5 mg by mouth twice a day  Xgeva 120 mg subcutaneous every month     Interim History:  Ms. BCurlingis back for follow-up. she actually is looking quite good. She really has recovered nicely from her brain surgery. She had this on March 27. She had a solitary CNS metastasis resected. The pathology report (([id number] did not show any obvious viable tumor. Hopefully, this is from where she had passed stereotactic radiosurgery.  She has had no problems with cough or shortness of breath. She's had no change in bowel or bladder habits. She's had no seizures. I'm not sure she is still on seizure medication.  His been no rashes. She has had a little bit of leg swelling. She is on low-dose hydrocortisone.  She does have the pacemaker in. This has been doing pretty well.  She is on ELIQUIS. She is on low-dose ELIQUIS and doing well.  Overall, her performance status is ECOG 2.   Medications:  Current Outpatient Prescriptions:  .  acetaminophen (TYLENOL) 500 MG tablet, Take 500 mg by mouth every 6 (six) hours as needed (for pain.). , Disp: , Rfl:  .  [employer] MELATONIN 5 MG TABS, Take 5 mg by mouth at bedtime as needed (for sleep.). , Disp: , Rfl: 2 .  docusate sodium (COLACE) 100 MG capsule, Take 100 mg by mouth 2 (two) times daily., Disp: , Rfl:  .  ELIQUIS 2.5 MG TABS tablet, TAKE 1 TABLET (2.5 MG TOTAL) BY MOUTH 2 (TWO) TIMES DAILY., Disp: 180 tablet, Rfl: 2 .  hydrocortisone (CORTEF) 10 MG tablet, Take 1 tablet (10 mg total) by mouth 2 (two) times daily., Disp: 60 tablet, Rfl: 1 .  isosorbide mononitrate  (IMDUR) 30 MG 24 hr tablet, Take 1 tablet (30 mg total) by mouth 2 (two) times daily., Disp: 60 tablet, Rfl: 0 .  levofloxacin (LEVAQUIN) 250 MG tablet, Take 2 pills on day 1, then 1 pill daily for 6 days more, Disp: 8 tablet, Rfl: 0 .  lidocaine-prilocaine (EMLA) cream, Apply 1 application topically daily as needed (prior to port begin access)., Disp: , Rfl:  .  metoprolol succinate (TOPROL-XL) 50 MG 24 hr tablet, Take 1 tablet (50 mg total) by mouth every morning. Take with or immediately following a meal., Disp: 90 tablet, Rfl: 3 .  nitroGLYCERIN (NITROSTAT) 0.4 MG SL tablet, Place 1 tablet (0.4 mg total) under the tongue every 5 (five) minutes as needed for chest pain (MAX 3 TABLETS)., Disp: 25 tablet, Rfl: 1 .  pantoprazole (PROTONIX) 40 MG tablet, TAKE 1 TABLET (40 MG TOTAL) BY MOUTH DAILY., Disp: 30 tablet, Rfl: 2 .  polyethylene glycol (MIRALAX / GLYCOLAX) packet, Take 17 g by mouth daily as needed (constipation). Mix in 8 oz liquid and drink , Disp: , Rfl:  .  polyvinyl alcohol (LIQUIFILM TEARS) 1.4 % ophthalmic solution, Place 1 drop into both eyes every hour as needed for dry eyes., Disp: 15 mL, Rfl: 0 .  temazepam (RESTORIL) 15 MG capsule, TAKE 1 CAPSULE BY MOUTH AT BEDTIME AS NEEDED FOR INSOMNIA MAY REPEAT X 1 DOSE, Disp: , Rfl:  2 .  vitamin E 400 UNIT capsule, Take 400 Units by mouth as directed. Take 1 capsule daily for one week then start taking 1 capsule twice daily, Disp: , Rfl:  No current facility-administered medications for this visit.   Facility-Administered Medications Ordered in Other Visits:  .  sodium chloride flush (NS) 0.9 % injection 10 mL, 10 mL, Intravenous, PRN, [place], Holli Humbles, NP, 10 mL at 12/26/16 1447  Allergies:  Allergies  Allergen Reactions  . Cefpodoxime Other (See Comments)    Nocturia & disturbed sleep  . Codeine Nausea Only    Past Medical History, Surgical history, Social history, and Family History were reviewed and updated.  Review of  Systems: Has above  Physical Exam:  weight is 185 lb (83.9 kg). Her oral temperature is 98 F (36.7 C). Her blood pressure is 141/60 (abnormal) and her pulse is 82. Her respiration is 18 and oxygen saturation is 100%.   Wt Readings from Last 3 Encounters:  04/08/17 185 lb (83.9 kg)  03/13/17 180 lb 12.8 oz (82 kg)  03/05/17 186 lb 6.4 oz (84.6 kg)     Elderly white female.  she really has a small surgical scar on her right frontal scalp from the craniotomy. She has healed up very well from this. She has no ecchymoses on her face. Her extra-ocular muscles move well. There is no ptosis. She has no intraoral lesions.There is no adenopathy in the neck. Her thyroid is nonpalpable. Lungs are clear bilaterally. Cardiac exam regular rate and rhythm with an occasional extra beat. There is no murmurs, rubs or bruits. Abdomen is soft. She has good bowel sounds. There is no fluid wave. There is no palpable liver or spleen tip. Back exam shows no tenderness over the spine, ribs or hips. Extremities shows some trace edema in her lower extremities. Skin exam shows no rashes, ecchymoses or petechia.  Neurological exam shows no focal neurological deficits.  Lab Results  Component Value Date   WBC 7.3 04/08/2017   HGB 9.5 (L) 04/08/2017   HCT 31.1 (L) 04/08/2017   MCV 85 04/08/2017   PLT 257 04/08/2017     Chemistry      Component Value Date/Time   NA 134 04/08/2017 1431   NA 137 08/08/2016 0948   K 4.4 04/08/2017 1431   K 4.2 08/08/2016 0948   CL 101 04/08/2017 1431   CO2 28 04/08/2017 1431   CO2 23 08/08/2016 0948   BUN 12 04/08/2017 1431   BUN 40.4 (H) 08/08/2016 0948   CREATININE 1.5 (H) 04/08/2017 1431   CREATININE 1.4 (H) 08/08/2016 0948      Component Value Date/Time   CALCIUM 9.1 04/08/2017 1431   CALCIUM 9.6 08/08/2016 0948   ALKPHOS 74 04/08/2017 1431   ALKPHOS 99 08/08/2016 0948   AST 17 04/08/2017 1431   AST 11 08/08/2016 0948   ALT 21 04/08/2017 1431   ALT 26 08/08/2016 0948    BILITOT 0.60 04/08/2017 1431   BILITOT 0.52 08/08/2016 0948         Impression and Plan: Brooke Rios is  an 83 year old white female. She has metastatic kidney cancer. This is clear-cell carcinoma. She presented with a solitary CNS met. This was treated with stereotactic radiosurgery. She had lung metastasis. She has the right kidney as a primary.   We did go ahead and get the CNS tumor out. Again, there is no viable cancer that was found.  It will be very interested to see how things  look. I think we probably do need to get her set up with a CT scan so we can see how her cancer is progressing. By her clinical status, I would have to say that her cancer probably is moving along slowly.   we see her back in one month, we will get a CT scan sanded that we see her.   I'm just so glad that her quality of life is doing so well.   Volanda Napoleon, MD 5/21/20184:01 PM

## 2017-04-08 NOTE — Patient Instructions (Signed)

## 2017-04-14 ENCOUNTER — Encounter: Payer: Self-pay | Admitting: Hematology & Oncology

## 2017-04-16 ENCOUNTER — Encounter: Payer: Self-pay | Admitting: Hematology & Oncology

## 2017-04-18 ENCOUNTER — Other Ambulatory Visit: Payer: Self-pay | Admitting: *Deleted

## 2017-04-18 MED ORDER — METOPROLOL SUCCINATE ER 50 MG PO TB24: 50.0000 mg | ORAL_TABLET | Freq: Every morning | ORAL | 3 refills | Status: AC
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
## 2017-04-19 NOTE — Addendum Note (Signed)
Addendum  created 04/19/17 1127 by Effie Berkshire, MD   Sign clinical note

## 2017-04-21 ENCOUNTER — Other Ambulatory Visit: Payer: Self-pay | Admitting: Family

## 2017-04-21 DIAGNOSIS — C7951 Secondary malignant neoplasm of bone: Secondary | ICD-10-CM

## 2017-04-21 DIAGNOSIS — N3000 Acute cystitis without hematuria: Secondary | ICD-10-CM

## 2017-04-21 DIAGNOSIS — C7931 Secondary malignant neoplasm of brain: Secondary | ICD-10-CM

## 2017-04-21 DIAGNOSIS — C7800 Secondary malignant neoplasm of unspecified lung: Principal | ICD-10-CM

## 2017-04-21 DIAGNOSIS — R5383 Other fatigue: Secondary | ICD-10-CM

## 2017-04-21 DIAGNOSIS — C649 Malignant neoplasm of unspecified kidney, except renal pelvis: Secondary | ICD-10-CM

## 2017-04-22 ENCOUNTER — Encounter: Payer: Self-pay | Admitting: Cardiology

## 2017-04-22 ENCOUNTER — Ambulatory Visit (INDEPENDENT_AMBULATORY_CARE_PROVIDER_SITE_OTHER): Payer: Medicare Other | Admitting: Cardiology

## 2017-04-22 ENCOUNTER — Other Ambulatory Visit: Payer: Self-pay | Admitting: Cardiology

## 2017-04-22 VITALS — BP 133/70 | HR 80 | Ht 66.0 in | Wt 184.2 lb

## 2017-04-22 DIAGNOSIS — I2510 Atherosclerotic heart disease of native coronary artery without angina pectoris: Secondary | ICD-10-CM

## 2017-04-22 DIAGNOSIS — I480 Paroxysmal atrial fibrillation: Secondary | ICD-10-CM | POA: Diagnosis not present

## 2017-04-22 DIAGNOSIS — I495 Sick sinus syndrome: Secondary | ICD-10-CM

## 2017-04-22 NOTE — Patient Instructions (Signed)
Continue your current therapy  I will see you in 6  Months.

## 2017-04-22 NOTE — Telephone Encounter (Signed)
REFILL 

## 2017-04-24 DIAGNOSIS — M1712 Unilateral primary osteoarthritis, left knee: Secondary | ICD-10-CM | POA: Diagnosis not present

## 2017-04-30 ENCOUNTER — Encounter: Payer: Self-pay | Admitting: Family Medicine

## 2017-04-30 DIAGNOSIS — R82998 Other abnormal findings in urine: Secondary | ICD-10-CM

## 2017-05-01 NOTE — Telephone Encounter (Signed)
Called Brooke Rios back- they have noted that her urine seemed to be strong and her urine appeared dark in her diaper No fever or behavior change Her last UTI had 2 multi drug resistant bugs. Will have her bring in a urine sample for [place] tomorrow (they will sterilize a container that they have on hand) and I will dip it, send for a culture .  If the dip looks suspicious will start her on abx while we await culture

## 2017-05-02 ENCOUNTER — Other Ambulatory Visit (INDEPENDENT_AMBULATORY_CARE_PROVIDER_SITE_OTHER): Payer: Medicare Other

## 2017-05-02 ENCOUNTER — Encounter: Payer: Self-pay | Admitting: Family Medicine

## 2017-05-02 DIAGNOSIS — R8299 Other abnormal findings in urine: Secondary | ICD-10-CM | POA: Diagnosis not present

## 2017-05-02 LAB — POCT URINALYSIS DIP (MANUAL ENTRY)
Bilirubin, UA: NEGATIVE
Blood, UA: NEGATIVE
Glucose, UA: NEGATIVE mg/dL
Ketones, POC UA: NEGATIVE mg/dL
Nitrite, UA: NEGATIVE
Protein Ur, POC: NEGATIVE mg/dL
Spec Grav, UA: 1.02
Urobilinogen, UA: 0.2 U/dL
pH, UA: 6

## 2017-05-03 ENCOUNTER — Encounter: Payer: Self-pay | Admitting: Family Medicine

## 2017-05-03 LAB — URINE CULTURE

## 2017-05-06 ENCOUNTER — Encounter: Payer: Self-pay | Admitting: Hematology & Oncology

## 2017-05-06 ENCOUNTER — Other Ambulatory Visit (HOSPITAL_BASED_OUTPATIENT_CLINIC_OR_DEPARTMENT_OTHER): Payer: Medicare Other

## 2017-05-06 ENCOUNTER — Other Ambulatory Visit: Payer: Medicare Other

## 2017-05-06 ENCOUNTER — Ambulatory Visit (HOSPITAL_BASED_OUTPATIENT_CLINIC_OR_DEPARTMENT_OTHER): Payer: Medicare Other

## 2017-05-06 ENCOUNTER — Ambulatory Visit (HOSPITAL_BASED_OUTPATIENT_CLINIC_OR_DEPARTMENT_OTHER)
Admission: RE | Admit: 2017-05-06 | Discharge: 2017-05-06 | Disposition: A | Discharge status: Home / Self Care | Payer: Medicare Other | Source: Ambulatory Visit | Attending: Hematology & Oncology | Admitting: Hematology & Oncology

## 2017-05-06 ENCOUNTER — Ambulatory Visit (HOSPITAL_BASED_OUTPATIENT_CLINIC_OR_DEPARTMENT_OTHER): Payer: Medicare Other | Admitting: Hematology & Oncology

## 2017-05-06 ENCOUNTER — Telehealth: Payer: Self-pay | Admitting: *Deleted

## 2017-05-06 VITALS — BP 147/58 | HR 89 | Temp 98.1°F | Resp 20 | Wt 184.0 lb

## 2017-05-06 DIAGNOSIS — C641 Malignant neoplasm of right kidney, except renal pelvis: Secondary | ICD-10-CM | POA: Diagnosis not present

## 2017-05-06 DIAGNOSIS — C7972 Secondary malignant neoplasm of left adrenal gland: Secondary | ICD-10-CM | POA: Diagnosis not present

## 2017-05-06 DIAGNOSIS — C7802 Secondary malignant neoplasm of left lung: Secondary | ICD-10-CM | POA: Diagnosis not present

## 2017-05-06 DIAGNOSIS — C7801 Secondary malignant neoplasm of right lung: Secondary | ICD-10-CM | POA: Insufficient documentation

## 2017-05-06 DIAGNOSIS — M25562 Pain in left knee: Secondary | ICD-10-CM | POA: Diagnosis not present

## 2017-05-06 DIAGNOSIS — C649 Malignant neoplasm of unspecified kidney, except renal pelvis: Secondary | ICD-10-CM

## 2017-05-06 DIAGNOSIS — I712 Thoracic aortic aneurysm, without rupture: Secondary | ICD-10-CM | POA: Diagnosis not present

## 2017-05-06 DIAGNOSIS — C7931 Secondary malignant neoplasm of brain: Secondary | ICD-10-CM

## 2017-05-06 DIAGNOSIS — C7951 Secondary malignant neoplasm of bone: Principal | ICD-10-CM

## 2017-05-06 DIAGNOSIS — C7800 Secondary malignant neoplasm of unspecified lung: Principal | ICD-10-CM

## 2017-05-06 DIAGNOSIS — C3490 Malignant neoplasm of unspecified part of unspecified bronchus or lung: Secondary | ICD-10-CM | POA: Diagnosis not present

## 2017-05-06 LAB — CMP (CANCER CENTER ONLY)
ALK PHOS: 96 U/L — AB (ref 26–84)
ALT: 17 U/L (ref 10–47)
AST: 13 U/L (ref 11–38)
Albumin: 2.8 g/dL — ABNORMAL LOW (ref 3.3–5.5)
BILIRUBIN TOTAL: 0.6 mg/dL (ref 0.20–1.60)
BUN: 16 mg/dL (ref 7–22)
CO2: 28 meq/L (ref 18–33)
Calcium: 9.5 mg/dL (ref 8.0–10.3)
Chloride: 101 mEq/L (ref 98–108)
Creat: 1.1 mg/dl (ref 0.6–1.2)
GLUCOSE: 104 mg/dL (ref 73–118)
Potassium: 4.4 mEq/L (ref 3.3–4.7)
Sodium: 138 mEq/L (ref 128–145)
Total Protein: 6.5 g/dL (ref 6.4–8.1)

## 2017-05-06 LAB — CBC WITH DIFFERENTIAL (CANCER CENTER ONLY)
BASO#: 0 10*3/uL (ref 0.0–0.2)
BASO%: 0.2 % (ref 0.0–2.0)
EOS%: 1.1 % (ref 0.0–7.0)
Eosinophils Absolute: 0.1 10*3/uL (ref 0.0–0.5)
HCT: 32.7 % — ABNORMAL LOW (ref 34.8–46.6)
HGB: 10 g/dL — ABNORMAL LOW (ref 11.6–15.9)
LYMPH#: 1.2 10*3/uL (ref 0.9–3.3)
LYMPH%: 18.7 % (ref 14.0–48.0)
MCH: 25.4 pg — ABNORMAL LOW (ref 26.0–34.0)
MCHC: 30.6 g/dL — AB (ref 32.0–36.0)
MCV: 83 fL (ref 81–101)
MONO#: 0.5 10*3/uL (ref 0.1–0.9)
MONO%: 7.4 % (ref 0.0–13.0)
NEUT#: 4.6 10*3/uL (ref 1.5–6.5)
NEUT%: 72.6 % (ref 39.6–80.0)
PLATELETS: 260 10*3/uL (ref 145–400)
RBC: 3.94 10*6/uL (ref 3.70–5.32)
RDW: 17 % — AB (ref 11.1–15.7)
WBC: 6.3 10*3/uL (ref 3.9–10.0)

## 2017-05-06 LAB — LACTATE DEHYDROGENASE: LDH: 187 U/L (ref 125–245)

## 2017-05-06 MED ORDER — DENOSUMAB 120 MG/1.7ML ~~LOC~~ SOLN
120.0000 mg | Freq: Once | SUBCUTANEOUS | Status: AC
  Administered 2017-05-06: 120 mg via SUBCUTANEOUS
  Filled 2017-05-06: qty 1.7

## 2017-05-06 NOTE — Progress Notes (Signed)
Hematology and Oncology Follow Up Visit  Brooke Rios [id number] [birth year] 83 y.o. 05/06/2017   Principle Diagnosis:   Metastatic  RIGHT renal cell carcinoma-progressive  Paroxysmal atrial fibrillation  Current Therapy:    Votrient 400 mg by mouth daily  Nivolumab 26m IV q 2 week - start 10/30/2016 - s/p c#3  Status post stereotactic radiosurgery for solitary brain met  ELIQUIS 2.5 mg by mouth twice a day  Xgeva 120 mg subcutaneous every month     Interim History:  Brooke Rios back for follow-up. We did do a CT scan on her today. Unfortunately, the CT scan shows that she has progression of her disease. There is progression of her pulmonary metastasis. She has progression of the primary right renal mass. In addition, there is noted to be a cortical lesion involving the left proximal femur. This was incompletely visualized. We'll probably have to get a plain series to see without looks like. Again, she is on Xgeva to try to help minimize fracture risk. We may have to consider something for this area so that she does not develop a fracture that would really cause her problems.  She is not eating as much. Her weight is holding steady. She's had no nausea or vomiting. Her appetite is just decreased.  She's had no bleeding. She's had no headache. She's not fallen.  She has recovered quite well from her brain surgery.  She is on ELIQUIS. She's doing well on the ELIQUIS. Medications:  Current Outpatient Prescriptions:  .  acetaminophen (TYLENOL) 500 MG tablet, Take 500 mg by mouth every 6 (six) hours as needed (for pain.). , Disp: , Rfl:  .  [facility] MELATONIN 5 MG TABS, Take 5 mg by mouth at bedtime as needed (for sleep.). , Disp: , Rfl: 2 .  docusate sodium (COLACE) 100 MG capsule, Take 100 mg by mouth 2 (two) times daily., Disp: , Rfl:  .  ELIQUIS 2.5 MG TABS tablet, TAKE 1 TABLET (2.5 MG TOTAL) BY MOUTH 2 (TWO) TIMES DAILY., Disp: 180 tablet, Rfl: 2 .  hydrocortisone (CORTEF) 10  MG tablet, TAKE 1 TABLET BY MOUTH TWICE A DAY, Disp: 60 tablet, Rfl: 1 .  isosorbide mononitrate (IMDUR) 30 MG 24 hr tablet, TAKE 1 TABLET BY MOUTH TWICE A DAY, Disp: 60 tablet, Rfl: 11 .  levofloxacin (LEVAQUIN) 250 MG tablet, Take 2 pills on day 1, then 1 pill daily for 6 days more, Disp: 8 tablet, Rfl: 0 .  lidocaine-prilocaine (EMLA) cream, Apply 1 application topically daily as needed (prior to port begin access)., Disp: , Rfl:  .  metoprolol succinate (TOPROL-XL) 50 MG 24 hr tablet, Take 1 tablet (50 mg total) by mouth every morning. Take with or immediately following a meal., Disp: 90 tablet, Rfl: 3 .  nitroGLYCERIN (NITROSTAT) 0.4 MG SL tablet, Place 1 tablet (0.4 mg total) under the tongue every 5 (five) minutes as needed for chest pain (MAX 3 TABLETS)., Disp: 25 tablet, Rfl: 1 .  pantoprazole (PROTONIX) 40 MG tablet, TAKE 1 TABLET (40 MG TOTAL) BY MOUTH DAILY., Disp: 30 tablet, Rfl: 2 .  polyethylene glycol (MIRALAX / GLYCOLAX) packet, Take 17 g by mouth daily as needed (constipation). Mix in 8 oz liquid and drink , Disp: , Rfl:  .  polyvinyl alcohol (LIQUIFILM TEARS) 1.4 % ophthalmic solution, Place 1 drop into both eyes every hour as needed for dry eyes., Disp: 15 mL, Rfl: 0 .  temazepam (RESTORIL) 15 MG capsule, TAKE 1 CAPSULE BY MOUTH  AT BEDTIME AS NEEDED FOR INSOMNIA MAY REPEAT X 1 DOSE, Disp: , Rfl: 2 .  vitamin E 400 UNIT capsule, Take 400 Units by mouth as directed. Take 1 capsule daily for one week then start taking 1 capsule twice daily, Disp: , Rfl:  No current facility-administered medications for this visit.   Facility-Administered Medications Ordered in Other Visits:  .  sodium chloride flush (NS) 0.9 % injection 10 mL, 10 mL, Intravenous, PRN, [place], Holli Humbles, NP, 10 mL at 12/26/16 1447  Allergies:  Allergies  Allergen Reactions  . Cefpodoxime Other (See Comments)    Nocturia & disturbed sleep  . Codeine Nausea Only    Past Medical History, Surgical history,  Social history, and Family History were reviewed and updated.  Review of Systems: Has above  Physical Exam:  weight is 184 lb (83.5 kg). Her oral temperature is 98.1 F (36.7 C). Her blood pressure is 147/58 (abnormal) and her pulse is 89. Her respiration is 20 and oxygen saturation is 100%.   Wt Readings from Last 3 Encounters:  05/06/17 184 lb (83.5 kg)  04/22/17 184 lb 3.2 oz (83.6 kg)  04/08/17 185 lb (83.9 kg)     Elderly white female.  she really has a small surgical scar on her right frontal scalp from the craniotomy. She has healed up very well from this. She has no ecchymoses on her face. Her extra-ocular muscles move well. There is no ptosis. She has no intraoral lesions.There is no adenopathy in the neck. Her thyroid is nonpalpable. Lungs are clear bilaterally. Cardiac exam regular rate and rhythm with an occasional extra beat. There is no murmurs, rubs or bruits. Abdomen is soft. She has good bowel sounds. There is no fluid wave. There is no palpable liver or spleen tip. Back exam shows no tenderness over the spine, ribs or hips. Extremities shows some trace edema in her lower extremities. Skin exam shows no rashes, ecchymoses or petechia.  Neurological exam shows no focal neurological deficits.  Lab Results  Component Value Date   WBC 6.3 05/06/2017   HGB 10.0 (L) 05/06/2017   HCT 32.7 (L) 05/06/2017   MCV 83 05/06/2017   PLT 260 05/06/2017     Chemistry      Component Value Date/Time   NA 138 05/06/2017 0829   NA 137 08/08/2016 0948   K 4.4 05/06/2017 0829   K 4.2 08/08/2016 0948   CL 101 05/06/2017 0829   CO2 28 05/06/2017 0829   CO2 23 08/08/2016 0948   BUN 16 05/06/2017 0829   BUN 40.4 (H) 08/08/2016 0948   CREATININE 1.1 05/06/2017 0829   CREATININE 1.4 (H) 08/08/2016 0948      Component Value Date/Time   CALCIUM 9.5 05/06/2017 0829   CALCIUM 9.6 08/08/2016 0948   ALKPHOS 96 (H) 05/06/2017 0829   ALKPHOS 99 08/08/2016 0948   AST 13 05/06/2017 0829    AST 11 08/08/2016 0948   ALT 17 05/06/2017 0829   ALT 26 08/08/2016 0948   BILITOT 0.60 05/06/2017 0829   BILITOT 0.52 08/08/2016 0948         Impression and Plan: Brooke Rios is  an 83 year old white female. She has metastatic kidney cancer. This is clear-cell carcinoma. She presented with a solitary CNS met. This was treated with stereotactic radiosurgery. She had lung metastasis. She has the right kidney as a primary.   We did go ahead and get the CNS tumor out. Again, there is no viable cancer that  was found.  I am a little disappointed about the results of the CT scan .  We still have to focus on her quality of life. I still believe that we really have to get hospice. I cannot imagine why hospice would not see her. The Xgeva injections are to help prevent fractures. It is not any aggressive therapy at all.   Brooke Rios and her family are all open to having hospice come in.   I would like to see her back in about 6 weeks.   I spent about 30 minutes with she and her daughter.    Volanda Napoleon, MD 6/18/201810:08 AM

## 2017-05-06 NOTE — Telephone Encounter (Signed)
New patient referral called into [facility]. Spoke with [employer] who will process referral.

## 2017-05-08 ENCOUNTER — Telehealth: Payer: Self-pay | Admitting: *Deleted

## 2017-05-08 ENCOUNTER — Encounter: Payer: Self-pay | Admitting: Hematology & Oncology

## 2017-05-08 NOTE — Telephone Encounter (Signed)
Received a call from Dr Jenene Slicker. Patient was worked up for Hospice admission however the patient and family want to continue Xgeva which is not allowable with Hospice. They decided that they would not accept a Hospice Admission but rather a Palliative Care Admission.   Reviewed with Dr Marin Olp and he is okay with this change.

## 2017-05-10 ENCOUNTER — Encounter: Payer: Self-pay | Admitting: Hematology & Oncology

## 2017-05-13 ENCOUNTER — Encounter: Payer: Self-pay | Admitting: Radiation Oncology

## 2017-05-13 NOTE — Progress Notes (Signed)
Histology and Location of Primary Cancer: Renal Cell Cancer  Sites of Visceral and Bony Metastatic Disease: Left  Proximal Femur   Location(s) of Symptomatic Metastases: She has pain in her Left knee up into her Left Thigh at times.   Past/Anticipated chemotherapy by medical oncology, if any:  Dr. Marin Olp 05/06/17 Current Therapy:         Votrient 400 mg by mouth daily  Nivolumab 231m IV q 2 week - start 10/30/2016 - s/p c#3  Status post stereotactic radiosurgery for solitary brain met  ELIQUIS 2.5 mg by mouth twice a day  Xgeva 120 mg subcutaneous every month  Pain on a scale of 0-10 is: She denies   If Spine Met(s), symptoms, if any, include:  Bowel/Bladder retention or incontinence (please describe): N/A  Numbness or weakness in extremities (please describe): Weakness in Left Leg  Current Decadron regimen, if applicable: N/A  Ambulatory status? Walker? Wheelchair?: Ambulatory  SAFETY ISSUES:  Prior radiation? Yes, 07/09/17 Right frontal 115mtarget was treated using 3 Dynamic Conformal Arcs to a prescription dose of 20 Gy prescribed to the 80% isodose line.  6FFF photons were used.                         Pacemaker/ICD? Yes  Possible current pregnancy? No  Is the patient on methotrexate? No  Current Complaints / other details:   05/06/17 Femur X-Ray FINDINGS: Lytic defect is seen involving the cortex of the proximal left femoral shaft concerning for metastatic disease. Moderate degenerative changes seen involving the left knee joint. No definite abnormality seen in the distal left femur.  BP (!) 136/57   Pulse 65   Temp 97.6 F (36.4 C)   Wt 180 lb 3.2 oz (81.7 kg)   SpO2 97% Comment: room air  BMI 29.09 kg/m    Wt Readings from Last 3 Encounters:  05/15/17 180 lb 3.2 oz (81.7 kg)  05/06/17 184 lb (83.5 kg)  04/22/17 184 lb 3.2 oz (83.6 kg)

## 2017-05-15 ENCOUNTER — Encounter: Payer: Self-pay | Admitting: Radiation Oncology

## 2017-05-15 ENCOUNTER — Ambulatory Visit
Admission: RE | Admit: 2017-05-15 | Discharge: 2017-05-15 | Disposition: A | Discharge status: Home / Self Care | Payer: Medicare Other | Source: Ambulatory Visit | Attending: Radiation Oncology | Admitting: Radiation Oncology

## 2017-05-15 VITALS — BP 136/57 | HR 65 | Temp 97.6°F | Wt 180.2 lb

## 2017-05-15 DIAGNOSIS — C7931 Secondary malignant neoplasm of brain: Secondary | ICD-10-CM | POA: Insufficient documentation

## 2017-05-15 DIAGNOSIS — C800 Disseminated malignant neoplasm, unspecified: Secondary | ICD-10-CM

## 2017-05-15 DIAGNOSIS — C7972 Secondary malignant neoplasm of left adrenal gland: Secondary | ICD-10-CM | POA: Diagnosis not present

## 2017-05-15 DIAGNOSIS — Z923 Personal history of irradiation: Secondary | ICD-10-CM | POA: Insufficient documentation

## 2017-05-15 DIAGNOSIS — Z885 Allergy status to narcotic agent status: Secondary | ICD-10-CM | POA: Insufficient documentation

## 2017-05-15 DIAGNOSIS — C649 Malignant neoplasm of unspecified kidney, except renal pelvis: Secondary | ICD-10-CM | POA: Diagnosis not present

## 2017-05-15 DIAGNOSIS — Z79899 Other long term (current) drug therapy: Secondary | ICD-10-CM | POA: Diagnosis not present

## 2017-05-15 DIAGNOSIS — M25562 Pain in left knee: Secondary | ICD-10-CM | POA: Insufficient documentation

## 2017-05-15 DIAGNOSIS — Z85528 Personal history of other malignant neoplasm of kidney: Secondary | ICD-10-CM | POA: Diagnosis not present

## 2017-05-15 DIAGNOSIS — Z510 Encounter for antineoplastic radiation therapy: Secondary | ICD-10-CM | POA: Insufficient documentation

## 2017-05-15 DIAGNOSIS — C7951 Secondary malignant neoplasm of bone: Secondary | ICD-10-CM | POA: Insufficient documentation

## 2017-05-15 DIAGNOSIS — Z888 Allergy status to other drugs, medicaments and biological substances status: Secondary | ICD-10-CM | POA: Diagnosis not present

## 2017-05-15 DIAGNOSIS — Z85841 Personal history of malignant neoplasm of brain: Secondary | ICD-10-CM | POA: Diagnosis not present

## 2017-05-15 NOTE — Progress Notes (Signed)
Radiation Oncology         [phone] ________________________________  Name: Brooke Rios MRN: [id number]  Date: 05/15/2017  DOB: 1925-08-23  Outpatient  Re-Consultation  CC: Copland, Gay Filler, MD  Copland, Gay Filler, MD  Diagnosis and Prior Radiotherapy:    ICD-10-CM   1. Metastatic renal cell carcinoma to bone (HCC) C79.51    C64.9   2. Widespread metastatic malignant neoplastic disease ([place]) C80.0   3. Metastatic renal cell carcinoma to brain [facility]) C79.31    C64.9     Bony metastatic disease 07/09/16 : Right Frontal Lobe 19 mm target treated to 20 Gy  CHIEF COMPLAINT: Here for surveillance of metastatic disease  Narrative:  The patient returns today for re-consultation based on recent scans. She is accompanied by family today.  Recent CT CAP on 05/06/17 revealed a 9.1 cm right lower pole renal mass, corresponding to known renal cell carcinoma, increased, and a 5.5 left adrenal metastasis, increased. Also noted were bilateral pulmonary metastases, measuring up to 12 mm, increased. A 4.2 cm ascending thoracic aortic aneurysm is noted.  Scans of the femur on 05/06/17 showed a lytic lesion seen in the proximal left femoral shaft concerning for metastatic disease.  The patient continues monthly Xgeva injections under Dr. Antonieta Pert care.  On review of systems, the patient reports she feels well overall except for pain in her left knee and left thigh. She reports weakness in her left leg. She receives visits from palliative care.  Of note, the patient has undergone previous radiation therapy. On 07/09/16, the patient received 20 Gy to the right frontal lobe. Surgical resection of inflamed necrotic tissue this year 02/12/17  was negative for residual malignancy.   ALLERGIES:  is allergic to cefpodoxime and codeine.  Meds: Current Outpatient Prescriptions  Medication Sig Dispense Refill  . docusate sodium (COLACE) 100 MG capsule Take 100 mg by mouth 2 (two) times daily.    Marland Kitchen  ELIQUIS 2.5 MG TABS tablet TAKE 1 TABLET (2.5 MG TOTAL) BY MOUTH 2 (TWO) TIMES DAILY. 180 tablet 2  . hydrocortisone (CORTEF) 10 MG tablet TAKE 1 TABLET BY MOUTH TWICE A DAY 60 tablet 1  . isosorbide mononitrate (IMDUR) 30 MG 24 hr tablet TAKE 1 TABLET BY MOUTH TWICE A DAY 60 tablet 11  . lidocaine-prilocaine (EMLA) cream Apply 1 application topically daily as needed (prior to port begin access).    . metoprolol succinate (TOPROL-XL) 50 MG 24 hr tablet Take 1 tablet (50 mg total) by mouth every morning. Take with or immediately following a meal. 90 tablet 3  . nitroGLYCERIN (NITROSTAT) 0.4 MG SL tablet Place 1 tablet (0.4 mg total) under the tongue every 5 (five) minutes as needed for chest pain (MAX 3 TABLETS). 25 tablet 1  . pantoprazole (PROTONIX) 40 MG tablet TAKE 1 TABLET (40 MG TOTAL) BY MOUTH DAILY. 30 tablet 2  . polyethylene glycol (MIRALAX / GLYCOLAX) packet Take 17 g by mouth daily as needed (constipation). Mix in 8 oz liquid and drink     . vitamin E 400 UNIT capsule Take 400 Units by mouth as directed. Take 1 capsule daily for one week then start taking 1 capsule twice daily    . acetaminophen (TYLENOL) 500 MG tablet Take 500 mg by mouth every 6 (six) hours as needed (for pain.).     Marland Kitchen [employer] MELATONIN 5 MG TABS Take 5 mg by mouth at bedtime as needed (for sleep.).   2  . polyvinyl alcohol (LIQUIFILM TEARS)  1.4 % ophthalmic solution Place 1 drop into both eyes every hour as needed for dry eyes. (Patient not taking: Reported on 05/15/2017) 15 mL 0  . temazepam (RESTORIL) 15 MG capsule TAKE 1 CAPSULE BY MOUTH AT BEDTIME AS NEEDED FOR INSOMNIA MAY REPEAT X 1 DOSE  2   No current facility-administered medications for this encounter.    Facility-Administered Medications Ordered in Other Encounters  Medication Dose Route Frequency Provider Last Rate Last Dose  . sodium chloride flush (NS) 0.9 % injection 10 mL  10 mL Intravenous PRN [place], Gryffin Altice M, NP   10 mL at 12/26/16 1447     ROS: as  above  Physical Findings: The patient is in no acute distress. Patient is alert and oriented.  weight is 180 lb 3.2 oz (81.7 kg). Her temperature is 97.6 F (36.4 C). Her blood pressure is 136/57 (abnormal) and her pulse is 65. Her oxygen saturation is 97%.   General: Alert and oriented, in no acute distress. HEENT: No oral thrush. Over the right anterior scalp, surgical scar from her prior craniotomy has healed very well. Neck: Neck is supple, no palpable cervical or supraclavicular lymphadenopathy. Heart: Regular in rate and rhythm with no murmurs. Systolic murmur loudest at the systolic and aortic regions. Chest: Clear to auscultation bilaterally. Extremities: She has mild edema in her lower extremities. Lymphatics: see Neck Exam Skin: No concerning lesions. Musculoskeletal: The patient takes small steps when walking. Her left lower extremity bows out laterally when walking; family reports this is related to degenerative changes from arthritis, and the patient has previously had cortisone injections to treat this. Some tenderness to palpation in the upper left thigh. Strength is grossly symmetric in upper and lower extremities. Neurologic: No obvious focalities. Speech is fluent. Coordination is intact. Psychiatric: Judgment and insight are intact. Affect is appropriate.  KPS = 50  100 - Normal; no complaints; no evidence of disease. 90   - Able to carry on normal activity; minor signs or symptoms of disease. 80   - Normal activity with effort; some signs or symptoms of disease. 69   - Cares for self; unable to carry on normal activity or to do active work. 60   - Requires occasional assistance, but is able to care for most of his personal needs. 50   - Requires considerable assistance and frequent medical care. 58   - Disabled; requires special care and assistance. 45   - Severely disabled; hospital admission is indicated although death not imminent. 69   - Very sick; hospital  admission necessary; active supportive treatment necessary. 10   - Moribund; fatal processes progressing rapidly. 0     - Dead  Karnofsky DA, Abelmann Marshall, Craver LS and [facility] [phone]) The use of the nitrogen mustards in the palliative treatment of carcinoma: with particular reference to bronchogenic carcinoma Cancer 1 634-56   Lab Findings: Lab Results  Component Value Date   WBC 6.3 05/06/2017   HGB 10.0 (L) 05/06/2017   HCT 32.7 (L) 05/06/2017   MCV 83 05/06/2017   PLT 260 05/06/2017    Radiographic Findings: Ct Abdomen Pelvis Wo Contrast  Result Date: 05/06/2017 CLINICAL DATA:  Metastatic kidney cancer with lung metastases EXAM: CT CHEST, ABDOMEN AND PELVIS WITHOUT CONTRAST TECHNIQUE: Multidetector CT imaging of the chest, abdomen and pelvis was performed following the standard protocol without IV contrast. COMPARISON:  None. FINDINGS: CT CHEST FINDINGS Cardiovascular: Mild cardiomegaly.  No pericardial effusion. Three vessel coronary atherosclerosis. 4.2 cm ascending  thoracic aortic aneurysm. Atherosclerotic calcifications of the aortic arch. Left subclavian pacemaker. Right chest port terminating at the cavoatrial junction. Mediastinum/Nodes: No suspicious mediastinal lymphadenopathy. Visualized thyroid is unremarkable. Lungs/Pleura: Numerous bilateral pulmonary nodules/ metastases bilaterally, including: --12 mm nodule along the lateral right lung apex (series 6/image 30), previously 5 mm --11 mm nodule in the right middle lobe (series 6/ image 94), previously 7 mm --10 mm left lower lobe nodule (series 6/ image 102), previously 8 mm No focal consolidation. No pleural effusion or pneumothorax. Musculoskeletal: Degenerative changes of the thoracic spine. Mild superior endplate changes at T4. CT ABDOMEN PELVIS FINDINGS Hepatobiliary: Unenhanced liver is unremarkable. Status post cholecystectomy. Pancreas: Within normal limits. Spleen: Within normal limits. Adrenals/Urinary Tract: 5.4 x  5.5 cm left adrenal metastasis (series 2/ image 62), previously 4.3 x 4.8 cm. Right adrenal gland is within normal limits. 9.1 x 8.1 cm right lower pole renal mass, corresponding to known renal cell carcinoma (series 2/image 72), previously 8.3 x 7.5 cm. Left kidney is unremarkable.  No renal calculi or hydronephrosis. Bladder is within normal limits. Stomach/Bowel: Stomach is within normal limits. No evidence of bowel obstruction. Appendix is not discretely visualized. Extensive left colonic diverticulosis, without evidence of diverticulitis. Vascular/Lymphatic: No evidence of abdominal aortic aneurysm. Atherosclerotic calcifications of the abdominal aorta and branch vessels. 13 mm short axis node adjacent to the right kidney (series 2/image 72), previously 12 mm short axis. Reproductive: Status post hysterectomy. No adnexal masses. Other: No abdominopelvic ascites. Musculoskeletal: Mild degenerative changes of the lumbar spine. Known destructive cortical lesion involving the left proximal femur is incompletely visualized (series 2/ image 142). IMPRESSION: 9.1 cm right lower pole renal mass, corresponding to known renal cell carcinoma, increased. 5.5 cm left adrenal metastasis, increased. Bilateral pulmonary metastases, measuring up to 12 mm, increased. 4.2 cm ascending thoracic aortic aneurysm. Given the patient's clinical history, attention on follow-up is likely satisfactory. Electronically Signed   By: Julian Hy M.D.   On: 05/06/2017 08:37   Ct Chest Wo Contrast  Result Date: 05/06/2017 CLINICAL DATA:  Metastatic kidney cancer with lung metastases EXAM: CT CHEST, ABDOMEN AND PELVIS WITHOUT CONTRAST TECHNIQUE: Multidetector CT imaging of the chest, abdomen and pelvis was performed following the standard protocol without IV contrast. COMPARISON:  None. FINDINGS: CT CHEST FINDINGS Cardiovascular: Mild cardiomegaly.  No pericardial effusion. Three vessel coronary atherosclerosis. 4.2 cm ascending  thoracic aortic aneurysm. Atherosclerotic calcifications of the aortic arch. Left subclavian pacemaker. Right chest port terminating at the cavoatrial junction. Mediastinum/Nodes: No suspicious mediastinal lymphadenopathy. Visualized thyroid is unremarkable. Lungs/Pleura: Numerous bilateral pulmonary nodules/ metastases bilaterally, including: --12 mm nodule along the lateral right lung apex (series 6/image 30), previously 5 mm --11 mm nodule in the right middle lobe (series 6/ image 94), previously 7 mm --10 mm left lower lobe nodule (series 6/ image 102), previously 8 mm No focal consolidation. No pleural effusion or pneumothorax. Musculoskeletal: Degenerative changes of the thoracic spine. Mild superior endplate changes at T4. CT ABDOMEN PELVIS FINDINGS Hepatobiliary: Unenhanced liver is unremarkable. Status post cholecystectomy. Pancreas: Within normal limits. Spleen: Within normal limits. Adrenals/Urinary Tract: 5.4 x 5.5 cm left adrenal metastasis (series 2/ image 62), previously 4.3 x 4.8 cm. Right adrenal gland is within normal limits. 9.1 x 8.1 cm right lower pole renal mass, corresponding to known renal cell carcinoma (series 2/image 72), previously 8.3 x 7.5 cm. Left kidney is unremarkable.  No renal calculi or hydronephrosis. Bladder is within normal limits. Stomach/Bowel: Stomach is within normal limits. No evidence of  bowel obstruction. Appendix is not discretely visualized. Extensive left colonic diverticulosis, without evidence of diverticulitis. Vascular/Lymphatic: No evidence of abdominal aortic aneurysm. Atherosclerotic calcifications of the abdominal aorta and branch vessels. 13 mm short axis node adjacent to the right kidney (series 2/image 72), previously 12 mm short axis. Reproductive: Status post hysterectomy. No adnexal masses. Other: No abdominopelvic ascites. Musculoskeletal: Mild degenerative changes of the lumbar spine. Known destructive cortical lesion involving the left proximal femur  is incompletely visualized (series 2/ image 142). IMPRESSION: 9.1 cm right lower pole renal mass, corresponding to known renal cell carcinoma, increased. 5.5 cm left adrenal metastasis, increased. Bilateral pulmonary metastases, measuring up to 12 mm, increased. 4.2 cm ascending thoracic aortic aneurysm. Given the patient's clinical history, attention on follow-up is likely satisfactory. Electronically Signed   By: Julian Hy M.D.   On: 05/06/2017 08:37   Dg Femur Min 2 Views Left  Result Date: 05/06/2017 CLINICAL DATA:  Chronic left knee pain. History of metastatic renal carcinoma. EXAM: LEFT FEMUR 2 VIEWS COMPARISON:  None. FINDINGS: Lytic defect is seen involving the cortex of the proximal left femoral shaft concerning for metastatic disease. Moderate degenerative changes seen involving the left knee joint. No definite abnormality seen in the distal left femur. IMPRESSION: Lytic lesion seen in proximal left femoral shaft concerning for metastatic disease. These results will be called to the ordering clinician or representative by the Radiologist Assistant, and communication documented in the PACS or zVision Dashboard. Electronically Signed   By: Marijo Conception, M.D.   On: 05/06/2017 11:29    Impression/Plan:  Brooke Rios is a 83 y.o. woman with bony metastatic disease. Recent scans showed a lytic lesion in the left femoral shaft concerning for metastatic disease.  Today I discussed palliative radiation therapy to the left femur. I discussed with the patient and family the risks, benefits, and potential side effects of radiation, as well as the logistics of palliative radiation treatment for the patient's and family's education. The patient is enthusiastic about proceeding with treatment. A consent form was discussed and signed. The patient is scheduled for CT Simulation this morning. Anticipate 8 Gy in 1 fraction to the left hip/femur.  I will request a Home Safety assessment in the  patient's home. She may also benefit from a consultation with PT/OT to discuss her mobility both in and out of the home.  Today I wrote a prescription for a cane. The patient has a walker at home. She is in agreement to try canes at a medical supply store. I discussed with the patient and family that she is at risk of falling, and I strongly encouraged the patient to use either a walker or a cane to support her mobility.  We discussed the pros/cons of an orthopedic consult. She and family know that she is at heightened risk of fracture. Given her guarded prognosis, and limited life expectancy, this will be deferred.   I spent 35 minutes face to face with the patient and more than 50% of that time was spent in counseling and/or coordination of care. _____________________________________   Eppie Gibson, MD  This document serves as a record of services personally performed by Eppie Gibson, MD. It was created on her behalf by Maryla Morrow, a trained medical scribe. The creation of this record is based on the scribe's personal observations and the provider's statements to them. This document has been checked and approved by the attending provider.

## 2017-05-15 NOTE — Progress Notes (Signed)
  Radiation Oncology         (336) [phone] ________________________________  Name: Brooke Rios MRN: [id number]  Date: 05/15/2017  DOB: 1925/07/28  SIMULATION AND TREATMENT PLANNING NOTE  Outpatient  DIAGNOSIS:     ICD-10-CM   1. Metastatic renal cell carcinoma to bone [facility]) C79.51    C64.9     NARRATIVE:  The patient was brought to the [facility].  Identity was confirmed.  All relevant records and images related to the planned course of therapy were reviewed.  The patient freely provided informed written consent to proceed with treatment after reviewing the details related to the planned course of therapy. The consent form was witnessed and verified by the simulation staff.    Then, the patient was set-up in a stable reproducible  supine position for radiation therapy.  CT images were obtained.  Surface markings were placed.  The CT images were loaded into the planning software.    TREATMENT PLANNING NOTE: Treatment planning then occurred.  The radiation prescription was entered and confirmed.    A total of 3 medically necessary complex treatment devices were fabricated and supervised by me, in the form of 2 fields with MLCs to block bowel, bladder, and a vaclok. MORE FIELDS WITH MLCs MAY BE ADDED IN DOSIMETRY for dose homogeneity.  I have requested : Isodose Plan.   The patient will receive 8 Gy in 1 fraction  to the left hip / upper femur.  -----------------------------------  Eppie Gibson, MD

## 2017-05-16 ENCOUNTER — Telehealth: Payer: Self-pay | Admitting: Radiation Oncology

## 2017-05-16 NOTE — Telephone Encounter (Signed)
Faxed pacemaker documentation form to the device clinic with urgency as requested by Dr. Isidore Moos. Fax confirmation of delivery obtained.

## 2017-05-17 ENCOUNTER — Other Ambulatory Visit: Payer: Self-pay | Admitting: Radiation Oncology

## 2017-05-17 DIAGNOSIS — C800 Disseminated malignant neoplasm, unspecified: Secondary | ICD-10-CM

## 2017-05-20 ENCOUNTER — Ambulatory Visit
Admission: RE | Admit: 2017-05-20 | Discharge: 2017-05-20 | Disposition: A | Discharge status: Home / Self Care | Payer: Medicare Other | Source: Ambulatory Visit | Attending: Radiation Oncology | Admitting: Radiation Oncology

## 2017-05-20 DIAGNOSIS — C7972 Secondary malignant neoplasm of left adrenal gland: Secondary | ICD-10-CM | POA: Diagnosis not present

## 2017-05-20 DIAGNOSIS — C649 Malignant neoplasm of unspecified kidney, except renal pelvis: Secondary | ICD-10-CM | POA: Diagnosis not present

## 2017-05-20 DIAGNOSIS — C7931 Secondary malignant neoplasm of brain: Secondary | ICD-10-CM | POA: Diagnosis not present

## 2017-05-20 DIAGNOSIS — Z510 Encounter for antineoplastic radiation therapy: Secondary | ICD-10-CM | POA: Diagnosis not present

## 2017-05-20 DIAGNOSIS — C800 Disseminated malignant neoplasm, unspecified: Secondary | ICD-10-CM | POA: Diagnosis not present

## 2017-05-20 DIAGNOSIS — C7951 Secondary malignant neoplasm of bone: Secondary | ICD-10-CM | POA: Diagnosis not present

## 2017-05-21 ENCOUNTER — Encounter: Payer: Self-pay | Admitting: Radiation Oncology

## 2017-05-21 NOTE — Progress Notes (Signed)
  Radiation Oncology         [phone] ________________________________  Name: Brooke Rios MRN: [id number]  Date: 05/21/2017  DOB: 1925-02-11  End of Treatment Note  Diagnosis:   Metastatic renal cell carcinoma to bone  Indication for treatment:  Palliative  Radiation treatment dates:   05/20/17  Site/dose:   Left Femur treated to 8 Gy in 1 fraction  Beams/energy:  Isodose Plan  //   10X  Narrative: The patient tolerated radiation treatment relatively well. Overall the patient was without complaint.  Plan: The patient has completed radiation treatment. The patient will return to radiation oncology clinic for routine followup in one month. I advised them to call or return sooner if they have any questions or concerns related to their recovery or treatment.  -----------------------------------  Eppie Gibson, MD  This document serves as a record of services personally performed by Eppie Gibson, MD. It was created on her behalf by Maryla Morrow, a trained medical scribe. The creation of this record is based on the scribe's personal observations and the provider's statements to them. This document has been checked and approved by the attending provider.

## 2017-05-24 ENCOUNTER — Other Ambulatory Visit: Payer: Self-pay | Admitting: *Deleted

## 2017-05-24 ENCOUNTER — Encounter: Payer: Self-pay | Admitting: Hematology & Oncology

## 2017-05-24 MED ORDER — METOCLOPRAMIDE HCL 5 MG PO TABS: 5.0000 mg | ORAL_TABLET | Freq: Three times a day (TID) | ORAL | 3 refills | Status: AC

## 2017-05-27 ENCOUNTER — Telehealth: Payer: Self-pay | Admitting: *Deleted

## 2017-05-27 NOTE — Telephone Encounter (Signed)
Received Physician Orders/Professional Communication from [facility], forwarded to provider/SLS 07/09

## 2017-05-30 ENCOUNTER — Telehealth: Payer: Self-pay | Admitting: Family Medicine

## 2017-05-30 DIAGNOSIS — R8290 Unspecified abnormal findings in urine: Secondary | ICD-10-CM

## 2017-05-30 NOTE — Telephone Encounter (Signed)
Called Brooke Rios back- they had noted a pink tinge on her depends today.  Her urine smells bad off and on She is acting well, no fever or other symptoms They will plan to come in tomorrow and drop off a urine sample  May need a hat to collect

## 2017-05-30 NOTE — Telephone Encounter (Signed)
Caller name:[place],Judy Relation to pt: daughter  Call back number: [phone]    Reason for call: Daughter requesting urine orders, patient experiencing dark color and odor with blood in urine, as per daughter patient has several UTI from time to time, please advise

## 2017-05-31 ENCOUNTER — Encounter: Payer: Self-pay | Admitting: Family Medicine

## 2017-06-04 ENCOUNTER — Encounter: Payer: Self-pay | Admitting: Hematology & Oncology

## 2017-06-04 ENCOUNTER — Telehealth: Payer: Self-pay | Admitting: *Deleted

## 2017-06-04 ENCOUNTER — Telehealth: Payer: Self-pay | Admitting: Cardiology

## 2017-06-04 ENCOUNTER — Ambulatory Visit (INDEPENDENT_AMBULATORY_CARE_PROVIDER_SITE_OTHER): Payer: Medicare Other | Admitting: *Deleted

## 2017-06-04 DIAGNOSIS — R531 Weakness: Secondary | ICD-10-CM | POA: Diagnosis not present

## 2017-06-04 DIAGNOSIS — I495 Sick sinus syndrome: Secondary | ICD-10-CM

## 2017-06-04 NOTE — Telephone Encounter (Addendum)
Patient's daughter calling. The patient is having some blood in her depends. She doesn't know if its from her urine or rectum. Reviewing the chart, it appears that they called the PCP last week for some blood in the urine, but declined work up. The patient is on Eliquis which is prescribed by the cardiologist.  Spoke with Dr Marin Olp. The patient's daughter will need to call the cardiologist about possibly holding the Eliquis. Dr Marin Olp wants palliative care to do a home visit today to assess patient and their symptoms.   Spoke to [place] at [facility] and they will send an RN to the home today for assessment and report findings to the office.   Patient's daughter aware of plan.    1400 - Received call back from [facility] at [place]. RN completed assessment and patient has had three episodes of bright red blood. The RN believes the bleeding to be vaginal. Reviewed with Dr Marin Olp and he would like patient to follow up with her GYN physician and ensure that they have called the cardiologist to inquire about holding the Eliquis,   [name] is aware of all instructions. She will call the cardiologist regarding the Eliquis and will obtain an appointment with the patient's GP to schedule a pelvic exam.

## 2017-06-04 NOTE — Telephone Encounter (Signed)
Confirmed remote transmission w/ pt daughter.   

## 2017-06-04 NOTE — Progress Notes (Addendum)
[facility] [address], [place], [place] [address] [phone]  Date:  06/05/2017   Name:  Brooke Rios   DOB:  [birth year]   MRN:  [id number]  PCP:  Darreld Mclean, MD    Chief Complaint: Vaginal Bleeding   History of Present Illness:  Brooke Rios is a 83 y.o. very pleasant female patient who presents with the following:  History of metastatic renal cell cancer.  Here today with concern of vaginal bleeding which has been present for about 2 days.  She had a little blood on her pad last week- her daughter called me, thought it might be a UTI but it cleared up They are pretty sure that the blood is vaginal- palliative care NP did think it was vaginal bleeding but she has not had a speculum exam as of yet  She did have a hysterectomy at age 24 approx due to fibroid tumors we think- no history of GYN cancer is known  Her cardiologist is Dr. Martinique-  She is not taking eliquis right now due to bleeding- will let him know She did have radiation to her left thigh last month and might have radiation cystitis.  However she is also prone to UTI- had a klebsiella and e coli UTI in April which we treated with levaquin  She has not noted any fever or chills Her appetites is ok but not great No vomiting or particular abd or back pain She has not noted any definite UTI sx, but her UTI often present with signs such as urine odor instead of sx Here today with her daughter who helps with the history  Patient Active Problem List   Diagnosis Date Noted  . Brain tumor ([place]) 02/12/2017  . Goals of care, counseling/discussion 12/26/2016  . Metastatic renal cell carcinoma to bone ([place]) 12/26/2016  . Acute blood loss anemia 10/08/2016  . Fall 10/06/2016  . Scalp laceration, initial encounter 10/06/2016  . CKD (chronic kidney disease) stage 3, GFR 30-59 ml/min 10/06/2016  . Metastatic renal cell carcinoma to lung ([place])  07/09/2016  . Metastatic renal cell carcinoma to brain ([place]) 07/09/2016  . Widespread metastatic malignant neoplastic disease ([place]) 06/06/2016  . Elevated TSH 08/19/2015  . Vertigo 06/21/2014  . Vomiting 06/21/2014  . History of anemia 09/08/2013  . History of gout 05/22/2013  . Pacemaker-Medtronic 09/10/2012  . Unspecified adverse effect of unspecified drug, medicinal and biological substance 06/12/2012  . Hyperglycemia 06/12/2012  . Tachy-brady syndrome ([place]) 02/05/2012  . Atrial fibrillation ([place]) 09/11/2011  . Coronary artery disease   . HIP PAIN 10/17/2010  . DIZZINESS 11/22/2009  . RENAL DISEASE, CHRONIC, MILD 06/08/2009  . BENIGN PAROXYSMAL POSITIONAL VERTIGO 12/30/2008  . BRADYCARDIA, CHRONIC 12/30/2008  . DIVERTICULOSIS, COLON 12/30/2008  . OSTEOARTHRITIS 12/30/2008  . COLONIC POLYPS, HX OF 12/30/2008  . Hyperlipidemia 12/24/2007  . Thrombocytopenia ([place]) 12/24/2007  . Essential hypertension 12/24/2007  . FIBROCYSTIC BREAST DISEASE 12/24/2007    Past Medical History:  Diagnosis Date  . Chronic kidney disease 2011   creat 1.5   . Colonic polyp   . Coronary artery disease    Moderate three vessel obstructive coronary disease  . Diverticulosis of colon   . E. coli UTI 02/2013   uniformly sensitive  . Goals of care, counseling/discussion 12/26/2016  . Gout   . History of radiation therapy 07/09/2016   SRS treatment to Right frontal lobe brain metastatis  .  Hyperlipidemia   . Hypertension   . Metastatic renal cell carcinoma to bone ([place]) 12/26/2016  . Metastatic renal cell carcinoma to brain ([place]) 07/09/2016  . Metastatic renal cell carcinoma to lung ([place]) 07/09/2016  . Myocardial infarction ([place]) 2012  . Osteoarthritis   . Sick sinus syndrome ([facility])    with paroxysmal atrial fibrillation  . Vertigo     Past Surgical History:  Procedure Laterality Date  . ABDOMINAL HYSTERECTOMY    . APPENDECTOMY     done at [facility]  . APPLICATION OF CRANIAL NAVIGATION Right  02/12/2017   Procedure: APPLICATION OF CRANIAL NAVIGATION;  Surgeon: Consuella Lose, MD;  Location: [facility];  Service: Neurosurgery;  Laterality: Right;  . BREAST BIOPSY Left    x 1  . BREAST LUMPECTOMY Left 2009   Dr Margot Chimes  . CHOLECYSTECTOMY     age 71  . CHOLECYSTECTOMY    . COLONOSCOPY    . CRANIOTOMY Right 02/12/2017   Procedure: STERIOTACTIC RIGHT FRONTAL CRANIOTOMY, RESECTION OF TUMOR;  Surgeon: Consuella Lose, MD;  Location: [facility];  Service: Neurosurgery;  Laterality: Right;  STERIOTACTIC RIGHT FRONTAL CRANIOTOMY, RESECTION OF TUMOR  . IR GENERIC HISTORICAL  10/29/2016   IR FLUORO GUIDE PORT INSERTION RIGHT 10/29/2016 Greggory Keen, MD [facility]-INTERV RAD  . IR GENERIC HISTORICAL  10/29/2016   IR US GUIDE VASC ACCESS RIGHT 10/29/2016 Greggory Keen, MD [facility]INTER[facility]  . OOPHORECTOMY     bilat for fibroids @ age 14, anemia pre op due to dysfunctional menses yo  . PACEMAKER INSERTION  2011  . PAF induced MI, s/p pacer for Brady-tach syndrome  04/2010   Dr.jordan  . TONSILLECTOMY      Social History  Substance Use Topics  . Smoking status: Never Smoker  . Smokeless tobacco: Never Used  . Alcohol use No    Family History  Problem Relation Age of Onset  . Hyperlipidemia Father   . Stroke Father        TIA,CVA  . Heart failure Father   . Cancer Mother        ? renal primary  . Breast cancer Sister        11/08  . Breast cancer Maternal Aunt   . Colon cancer Maternal Aunt   . Breast cancer Daughter        lumpectomy 2007;mastectomy 2013  . Heart disease Brother        pacer  . Diabetes Neg Hx     Allergies  Allergen Reactions  . Cefpodoxime Other (See Comments)    Nocturia & disturbed sleep  . Codeine Nausea Only    Medication list has been reviewed and updated.  Current Outpatient Prescriptions on File Prior to Visit  Medication Sig Dispense Refill  . acetaminophen (TYLENOL) 500 MG tablet Take 500 mg by mouth every 6 (six) hours as needed (for pain.).     Marland Kitchen  [facility] MELATONIN 5 MG TABS Take 5 mg by mouth at bedtime as needed (for sleep.).   2  . docusate sodium (COLACE) 100 MG capsule Take 100 mg by mouth 2 (two) times daily.    . hydrocortisone (CORTEF) 10 MG tablet TAKE 1 TABLET BY MOUTH TWICE A DAY 60 tablet 1  . isosorbide mononitrate (IMDUR) 30 MG 24 hr tablet TAKE 1 TABLET BY MOUTH TWICE A DAY 60 tablet 11  . lidocaine-prilocaine (EMLA) cream Apply 1 application topically daily as needed (prior to port begin access).    . metoCLOPramide (REGLAN) 5 MG tablet Take  1 tablet (5 mg total) by mouth 3 (three) times daily before meals. 90 tablet 3  . metoprolol succinate (TOPROL-XL) 50 MG 24 hr tablet Take 1 tablet (50 mg total) by mouth every morning. Take with or immediately following a meal. 90 tablet 3  . nitroGLYCERIN (NITROSTAT) 0.4 MG SL tablet Place 1 tablet (0.4 mg total) under the tongue every 5 (five) minutes as needed for chest pain (MAX 3 TABLETS). 25 tablet 1  . pantoprazole (PROTONIX) 40 MG tablet TAKE 1 TABLET (40 MG TOTAL) BY MOUTH DAILY. 30 tablet 2  . polyethylene glycol (MIRALAX / GLYCOLAX) packet Take 17 g by mouth daily as needed (constipation). Mix in 8 oz liquid and drink     . polyvinyl alcohol (LIQUIFILM TEARS) 1.4 % ophthalmic solution Place 1 drop into both eyes every hour as needed for dry eyes. 15 mL 0  . temazepam (RESTORIL) 15 MG capsule TAKE 1 CAPSULE BY MOUTH AT BEDTIME AS NEEDED FOR INSOMNIA MAY REPEAT X 1 DOSE  2  . vitamin E 400 UNIT capsule Take 400 Units by mouth as directed. Take 1 capsule daily for one week then start taking 1 capsule twice daily    . ELIQUIS 2.5 MG TABS tablet TAKE 1 TABLET (2.5 MG TOTAL) BY MOUTH 2 (TWO) TIMES DAILY. (Patient not taking: Reported on 06/05/2017) 180 tablet 2   Current Facility-Administered Medications on File Prior to Visit  Medication Dose Route Frequency Provider Last Rate Last Dose  . sodium chloride flush (NS) 0.9 % injection 10 mL  10 mL Intravenous PRN [place], Holli Humbles, NP    10 mL at 12/26/16 1447    Review of Systems:  As per HPI- otherwise negative.   Physical Examination: Vitals:   06/05/17 1552  BP: 128/75  Pulse: 82  Temp: 97.9 F (36.6 C)   Vitals:   06/05/17 1552  Weight: 178 lb 6.4 oz (80.9 kg)  Height: 5\' 6"  (1.676 m)   Body mass index is 28.79 kg/m. Ideal Body Weight: Weight in (lb) to have BMI = 25: 154.6  GEN: WDWN, NAD, Non-toxic, A & O x 3 HEENT: Atraumatic, Normocephalic. Neck supple. No masses, No LAD. Ears and Nose: No external deformity. CV: RRR, No M/G/R. No JVD. No thrill. No extra heart sounds. PULM: CTA B, no wheezes, crackles, rhonchi. No retractions. No resp. distress. No accessory muscle use. ABD: S, NT, ND, +BS. No rebound. No HSM.  Belly is benign EXTR: No c/c/e NEURO Normal gait.  PSYCH: Normally interactive. Conversant. Not depressed or anxious appearing.  Calm demeanor.  Overweight, older lady who is here with her daughter today.  Appears well Gu: external genitals are normal.  She is s/p hysterectomy She shows irration and oozing blood at the urethral meatus, but otherwise no bleeding from inside her vagina.  The vaginal vault is normal   Assessment and Plan: Hematuria due to cystitis - Plan: levofloxacin (LEVAQUIN) 500 MG tablet, Urine Culture, POCT urinalysis dipstick, CANCELED: Urine Culture, CANCELED: POCT urinalysis dipstick  Here today with concern of vaginal bleeding.  However on exam it seems that bleeding is from the urethra.  She may have radiation cystitis or a UTI Culture pending. Will start her on levaquin pending resutls She is advised to seek emergency care if she cannot pass urine (in case of blockage by clot) or if any significant blood loss or hemorrhage  She has stopped her eliquis and asks me to alert Dr. Martinique- will do so Also touch base with  Dr. Marin Olp re:? Radiation cystitis   Signed Lamar Blinks, MD  Received her culture which was positive for UTI- called [name] on 7/20 to check  on her.  [name] reports that her mother is feeling much better.  Just a trace of blood in her panty pad, the gross bleeding has stopped.  Advised her to stay off eliquis until bleeding is gone, then can try to go back on 2.5 BID as per Dr. Martinique.  She agrees with plan  Results for orders placed or performed in visit on 06/05/17  Urine Culture  Result Value Ref Range   Culture KLEBSIELLA PNEUMONIAE    Colony Count Greater than 100,000 CFU/mL    Organism ID, Bacteria KLEBSIELLA PNEUMONIAE       Susceptibility   Klebsiella pneumoniae -  (no method available)    AMPICILLIN >=32 Resistant     AMOX/CLAVULANIC <=2 Sensitive     AMPICILLIN/SULBACTAM 4 Sensitive     PIP/TAZO <=4 Sensitive     IMIPENEM <=0.25 Sensitive     CEFAZOLIN <=4 Not Reportable     CEFTRIAXONE <=1 Sensitive     CEFTAZIDIME <=1 Sensitive     CEFEPIME <=1 Sensitive     GENTAMICIN <=1 Sensitive     TOBRAMYCIN <=1 Sensitive     CIPROFLOXACIN <=0.25 Sensitive     LEVOFLOXACIN <=0.12 Sensitive     NITROFURANTOIN 32 Sensitive     TRIMETH/SULFA* <=20 Sensitive      * NR=NOT REPORTABLE,SEE COMMENTORAL therapy:A cefazolin MIC of <32 predicts susceptibility to the oral agents cefaclor,cefdinir,cefpodoxime,cefprozil,cefuroxime,cephalexin,and loracarbef when used for therapy of uncomplicated UTIs due to E.coli,K.pneumomiae,and P.mirabilis. PARENTERAL therapy: A cefazolinMIC of >8 indicates resistance to parenteralcefazolin. An alternate test method must beperformed to confirm susceptibility to parenteralcefazolin.  POCT urinalysis dipstick  Result Value Ref Range   Color, UA Golden    Clarity, UA Cloudy    Glucose, UA Neg    Bilirubin, UA Neg    Ketones, UA Neg    Spec Grav, UA 1.025 1.010 - 1.025   Blood, UA 2+    pH, UA 6.0 5.0 - 8.0   Protein, UA Neg    Urobilinogen, UA 0.2 0.2 or 1.0 E.U./dL   Nitrite, UA POS    Leukocytes, UA  Negative   Message from Dr. Martinique regarding her hematuria:  If we can get the bleeding to stop  with antibiotics I would be in favor of resuming Eliquis 2.5 mg bid. If she were to have more significant bleeding then we may need to discontinue altogether. If we reach a point of Hospice/palliative care only then I wouldn't have a problem stopping.

## 2017-06-04 NOTE — Telephone Encounter (Signed)
New Message  Ms. Schoolfield call requesting to speak with RN. She states she was told by NP at the home pt is experiencing bleeding from her Vagina. Ms Schoolfield states Dr. Marin Olp stated pt should stop eliquis. Ms. Ozella Almond would like to discuss with RN . Please call back to discuss

## 2017-06-05 ENCOUNTER — Encounter: Payer: Self-pay | Admitting: Cardiology

## 2017-06-05 ENCOUNTER — Encounter: Payer: Self-pay | Admitting: Family Medicine

## 2017-06-05 ENCOUNTER — Ambulatory Visit (INDEPENDENT_AMBULATORY_CARE_PROVIDER_SITE_OTHER): Payer: Medicare Other | Admitting: Family Medicine

## 2017-06-05 VITALS — BP 128/75 | HR 82 | Temp 97.9°F | Ht 66.0 in | Wt 178.4 lb

## 2017-06-05 DIAGNOSIS — I2510 Atherosclerotic heart disease of native coronary artery without angina pectoris: Secondary | ICD-10-CM | POA: Diagnosis not present

## 2017-06-05 DIAGNOSIS — N3091 Cystitis, unspecified with hematuria: Secondary | ICD-10-CM | POA: Diagnosis not present

## 2017-06-05 LAB — POCT URINALYSIS DIPSTICK
BILIRUBIN UA: NEGATIVE
GLUCOSE UA: NEGATIVE
Ketones, UA: NEGATIVE
NITRITE UA: POSITIVE
Protein, UA: NEGATIVE
SPEC GRAV UA: 1.025 (ref 1.010–1.025)
Urobilinogen, UA: 0.2 E.U./dL
pH, UA: 6 (ref 5.0–8.0)

## 2017-06-05 MED ORDER — LEVOFLOXACIN 500 MG PO TABS: 500.0000 mg | ORAL_TABLET | Freq: Every day | ORAL | 0 refills | Status: DC

## 2017-06-05 NOTE — Telephone Encounter (Signed)
OK to hold Eliquis for now.  Peter Martinique MD, [facility]

## 2017-06-05 NOTE — Progress Notes (Signed)
Remote pacemaker transmission.   

## 2017-06-05 NOTE — Patient Instructions (Signed)
It was nice to see you today- we are going to culture your urine and start you on levquin 500 once a day for possible UTI.  You may also have "radiation cystitis." I will send a message to Dr. Marin Olp and to Dr. Martinique for you Let me know if the bleeding continues or if you have any severe bleeding

## 2017-06-05 NOTE — Telephone Encounter (Signed)
Returned call to [name] Dr.Jordan advised ok to hold Eliquis for now.

## 2017-06-06 DIAGNOSIS — H0235 Blepharochalasis left lower eyelid: Secondary | ICD-10-CM | POA: Diagnosis not present

## 2017-06-06 DIAGNOSIS — H0234 Blepharochalasis left upper eyelid: Secondary | ICD-10-CM | POA: Diagnosis not present

## 2017-06-06 LAB — CUP PACEART REMOTE DEVICE CHECK
Battery Impedance: 540 Ohm
Battery Remaining Longevity: 87 mo
Battery Voltage: 2.79 V
Brady Statistic AS VS Percent: 44 %
Date Time Interrogation Session: 20180717183641
Implantable Lead Implant Date: 20110608
Implantable Lead Serial Number: 480470
Lead Channel Impedance Value: 397 Ohm
Lead Channel Pacing Threshold Amplitude: 0.5 V
Lead Channel Setting Pacing Amplitude: 2 V
Lead Channel Setting Pacing Amplitude: 2.5 V
Lead Channel Setting Sensing Sensitivity: 2 mV
MDC IDC LEAD IMPLANT DT: 20110608
MDC IDC LEAD LOCATION: 753859
MDC IDC LEAD LOCATION: 753860
MDC IDC LEAD SERIAL: 672291
MDC IDC MSMT LEADCHNL RA IMPEDANCE VALUE: 368 Ohm
MDC IDC MSMT LEADCHNL RA PACING THRESHOLD AMPLITUDE: 0.5 V
MDC IDC MSMT LEADCHNL RA PACING THRESHOLD PULSEWIDTH: 0.4 ms
MDC IDC MSMT LEADCHNL RV PACING THRESHOLD PULSEWIDTH: 0.4 ms
MDC IDC PG IMPLANT DT: 20110608
MDC IDC SET LEADCHNL RV PACING PULSEWIDTH: 0.4 ms
MDC IDC STAT BRADY AP VP PERCENT: 2 %
MDC IDC STAT BRADY AP VS PERCENT: 54 %
MDC IDC STAT BRADY AS VP PERCENT: 0 %

## 2017-06-07 LAB — URINE CULTURE

## 2017-06-10 ENCOUNTER — Encounter: Payer: Self-pay | Admitting: Hematology & Oncology

## 2017-06-13 ENCOUNTER — Other Ambulatory Visit: Payer: Self-pay | Admitting: *Deleted

## 2017-06-13 ENCOUNTER — Encounter: Payer: Self-pay | Admitting: Family Medicine

## 2017-06-13 MED ORDER — LACTULOSE 10 GM/15ML PO SOLN: 10.0000 g | ORAL | 0 refills | Status: AC

## 2017-06-14 ENCOUNTER — Other Ambulatory Visit: Payer: Self-pay | Admitting: Radiation Therapy

## 2017-06-14 ENCOUNTER — Encounter: Payer: Self-pay | Admitting: Family Medicine

## 2017-06-14 DIAGNOSIS — C649 Malignant neoplasm of unspecified kidney, except renal pelvis: Secondary | ICD-10-CM

## 2017-06-14 DIAGNOSIS — C7931 Secondary malignant neoplasm of brain: Principal | ICD-10-CM

## 2017-06-17 ENCOUNTER — Encounter: Payer: Self-pay | Admitting: Hematology & Oncology

## 2017-06-17 ENCOUNTER — Other Ambulatory Visit (HOSPITAL_BASED_OUTPATIENT_CLINIC_OR_DEPARTMENT_OTHER): Payer: Medicare Other

## 2017-06-17 ENCOUNTER — Ambulatory Visit: Payer: Medicare Other

## 2017-06-17 ENCOUNTER — Ambulatory Visit (HOSPITAL_BASED_OUTPATIENT_CLINIC_OR_DEPARTMENT_OTHER): Payer: Medicare Other

## 2017-06-17 ENCOUNTER — Ambulatory Visit (HOSPITAL_BASED_OUTPATIENT_CLINIC_OR_DEPARTMENT_OTHER): Payer: Medicare Other | Admitting: Hematology & Oncology

## 2017-06-17 VITALS — BP 136/48 | HR 76 | Temp 98.8°F | Resp 18 | Wt 177.0 lb

## 2017-06-17 DIAGNOSIS — C7951 Secondary malignant neoplasm of bone: Secondary | ICD-10-CM

## 2017-06-17 DIAGNOSIS — C649 Malignant neoplasm of unspecified kidney, except renal pelvis: Secondary | ICD-10-CM

## 2017-06-17 DIAGNOSIS — C7800 Secondary malignant neoplasm of unspecified lung: Principal | ICD-10-CM

## 2017-06-17 DIAGNOSIS — C641 Malignant neoplasm of right kidney, except renal pelvis: Secondary | ICD-10-CM

## 2017-06-17 DIAGNOSIS — C7931 Secondary malignant neoplasm of brain: Secondary | ICD-10-CM | POA: Diagnosis not present

## 2017-06-17 DIAGNOSIS — Z95828 Presence of other vascular implants and grafts: Secondary | ICD-10-CM

## 2017-06-17 DIAGNOSIS — C7801 Secondary malignant neoplasm of right lung: Principal | ICD-10-CM

## 2017-06-17 LAB — CBC WITH DIFFERENTIAL (CANCER CENTER ONLY)
BASO#: 0 10*3/uL (ref 0.0–0.2)
BASO%: 0.2 % (ref 0.0–2.0)
EOS%: 0.9 % (ref 0.0–7.0)
Eosinophils Absolute: 0.1 10*3/uL (ref 0.0–0.5)
HEMATOCRIT: 30.8 % — AB (ref 34.8–46.6)
HGB: 9.3 g/dL — ABNORMAL LOW (ref 11.6–15.9)
LYMPH#: 0.7 10*3/uL — AB (ref 0.9–3.3)
LYMPH%: 12.1 % — ABNORMAL LOW (ref 14.0–48.0)
MCH: 24.5 pg — ABNORMAL LOW (ref 26.0–34.0)
MCHC: 30.2 g/dL — ABNORMAL LOW (ref 32.0–36.0)
MCV: 81 fL (ref 81–101)
MONO#: 0.4 10*3/uL (ref 0.1–0.9)
MONO%: 7.2 % (ref 0.0–13.0)
NEUT%: 79.6 % (ref 39.6–80.0)
NEUTROS ABS: 4.5 10*3/uL (ref 1.5–6.5)
Platelets: 244 10*3/uL (ref 145–400)
RBC: 3.79 10*6/uL (ref 3.70–5.32)
RDW: 17 % — AB (ref 11.1–15.7)
WBC: 5.7 10*3/uL (ref 3.9–10.0)

## 2017-06-17 LAB — CMP (CANCER CENTER ONLY)
ALK PHOS: 111 U/L — AB (ref 26–84)
ALT: 10 U/L (ref 10–47)
AST: 15 U/L (ref 11–38)
Albumin: 2.5 g/dL — ABNORMAL LOW (ref 3.3–5.5)
BILIRUBIN TOTAL: 0.5 mg/dL (ref 0.20–1.60)
BUN: 19 mg/dL (ref 7–22)
CALCIUM: 11.9 mg/dL — AB (ref 8.0–10.3)
CO2: 28 mEq/L (ref 18–33)
Chloride: 101 mEq/L (ref 98–108)
Creat: 1.1 mg/dl (ref 0.6–1.2)
GLUCOSE: 197 mg/dL — AB (ref 73–118)
Potassium: 3.6 mEq/L (ref 3.3–4.7)
Sodium: 131 mEq/L (ref 128–145)
TOTAL PROTEIN: 6.6 g/dL (ref 6.4–8.1)

## 2017-06-17 LAB — LACTATE DEHYDROGENASE: LDH: 121 U/L — AB (ref 125–245)

## 2017-06-17 MED ORDER — SODIUM CHLORIDE 0.9% FLUSH
10.0000 mL | INTRAVENOUS | Status: DC | PRN
  Administered 2017-06-17: 10 mL via INTRAVENOUS
  Filled 2017-06-17: qty 10

## 2017-06-17 MED ORDER — DENOSUMAB 120 MG/1.7ML ~~LOC~~ SOLN
120.0000 mg | Freq: Once | SUBCUTANEOUS | Status: AC
  Administered 2017-06-17: 120 mg via SUBCUTANEOUS

## 2017-06-17 MED ORDER — DENOSUMAB 120 MG/1.7ML ~~LOC~~ SOLN
SUBCUTANEOUS | Status: AC
  Filled 2017-06-17: qty 1.7

## 2017-06-17 MED ORDER — HEPARIN SOD (PORK) LOCK FLUSH 100 UNIT/ML IV SOLN
500.0000 [IU] | Freq: Once | INTRAVENOUS | Status: AC
  Administered 2017-06-17: 500 [IU] via INTRAVENOUS
  Filled 2017-06-17: qty 5

## 2017-06-17 NOTE — Patient Instructions (Signed)

## 2017-06-17 NOTE — Progress Notes (Signed)
Hematology and Oncology Follow Up Visit  LIAM BOSSMAN [id number] 15-Mar-1925 83 y.o. 06/17/2017   Principle Diagnosis:   Metastatic  RIGHT renal cell carcinoma-progressive  Paroxysmal atrial fibrillation  Current Therapy:    Votrient 400 mg by mouth daily  Nivolumab 274m IV q 2 week - start 10/30/2016 - s/p c#3  Status post stereotactic radiosurgery for solitary brain met  ELIQUIS 2.5 mg by mouth daily  Xgeva 120 mg subcutaneous every month  Palliative radiation to the left femur - 1 dose of 8 Gy     Interim History:  Ms. BForondais back for follow-up. She clearly looks much weaker. She is sleeping more. She is not eating as much.  Her back bothers her. This probably is more arthritic than anything else.   She did have palliative radiation to the left femur. She got through this okay.   Her weight is down. She is not eating as much as.   Her calcium clearly is up. Corrected, calcium is over 13.   Hospice is doing a very good job. They're coming out to see her.   She had some hematuria. This got better. She was taken off ELIQUIS. I told her daughter to get her back on ELIQUIS at 2.5 mg a day. If any other bleeding begins, we'll have the ELIQUIS stopped.   She did have a busy weekend. I'm glad that she was able to participate in the activities.  Overall, her performance status is now ECOG 3.   Medications:  Current Outpatient Prescriptions:  .  acetaminophen (TYLENOL) 500 MG tablet, Take 500 mg by mouth every 6 (six) hours as needed (for pain.). , Disp: , Rfl:  .  [facility]S MELATONIN 5 MG TABS, Take 5 mg by mouth at bedtime as needed (for sleep.). , Disp: , Rfl: 2 .  docusate sodium (COLACE) 100 MG capsule, Take 100 mg by mouth 2 (two) times daily., Disp: , Rfl:  .  ELIQUIS 2.5 MG TABS tablet, TAKE 1 TABLET (2.5 MG TOTAL) BY MOUTH 2 (TWO) TIMES DAILY. (Patient not taking: Reported on 06/05/2017), Disp: 180 tablet, Rfl: 2 .  hydrocortisone (CORTEF) 10 MG tablet, TAKE 1  TABLET BY MOUTH TWICE A DAY, Disp: 60 tablet, Rfl: 1 .  isosorbide mononitrate (IMDUR) 30 MG 24 hr tablet, TAKE 1 TABLET BY MOUTH TWICE A DAY, Disp: 60 tablet, Rfl: 11 .  lactulose (CHRONULAC) 10 GM/15ML solution, Take 15 mLs (10 g total) by mouth every 4 (four) hours. Take until BM, Disp: 473 mL, Rfl: 0 .  levofloxacin (LEVAQUIN) 500 MG tablet, Take 1 tablet (500 mg total) by mouth daily., Disp: 7 tablet, Rfl: 0 .  lidocaine-prilocaine (EMLA) cream, Apply 1 application topically daily as needed (prior to port begin access)., Disp: , Rfl:  .  metoCLOPramide (REGLAN) 5 MG tablet, Take 1 tablet (5 mg total) by mouth 3 (three) times daily before meals., Disp: 90 tablet, Rfl: 3 .  metoprolol succinate (TOPROL-XL) 50 MG 24 hr tablet, Take 1 tablet (50 mg total) by mouth every morning. Take with or immediately following a meal., Disp: 90 tablet, Rfl: 3 .  nitroGLYCERIN (NITROSTAT) 0.4 MG SL tablet, Place 1 tablet (0.4 mg total) under the tongue every 5 (five) minutes as needed for chest pain (MAX 3 TABLETS)., Disp: 25 tablet, Rfl: 1 .  pantoprazole (PROTONIX) 40 MG tablet, TAKE 1 TABLET (40 MG TOTAL) BY MOUTH DAILY., Disp: 30 tablet, Rfl: 2 .  polyethylene glycol (MIRALAX / GLYCOLAX) packet, Take 17 g  by mouth daily as needed (constipation). Mix in 8 oz liquid and drink , Disp: , Rfl:  .  polyvinyl alcohol (LIQUIFILM TEARS) 1.4 % ophthalmic solution, Place 1 drop into both eyes every hour as needed for dry eyes., Disp: 15 mL, Rfl: 0 .  temazepam (RESTORIL) 15 MG capsule, TAKE 1 CAPSULE BY MOUTH AT BEDTIME AS NEEDED FOR INSOMNIA MAY REPEAT X 1 DOSE, Disp: , Rfl: 2 .  vitamin E 400 UNIT capsule, Take 400 Units by mouth as directed. Take 1 capsule daily for one week then start taking 1 capsule twice daily, Disp: , Rfl:  No current facility-administered medications for this visit.   Facility-Administered Medications Ordered in Other Visits:  .  sodium chloride flush (NS) 0.9 % injection 10 mL, 10 mL,  Intravenous, PRN, [place], Holli Humbles, NP, 10 mL at 12/26/16 1447  Allergies:  Allergies  Allergen Reactions  . Cefpodoxime Other (See Comments)    Nocturia & disturbed sleep  . Codeine Nausea Only    Past Medical History, Surgical history, Social history, and Family History were reviewed and updated.  Review of Systems: Has above  Physical Exam:  weight is 177 lb (80.3 kg). Her oral temperature is 98.8 F (37.1 C). Her blood pressure is 136/48 (abnormal) and her pulse is 76. Her respiration is 18 and oxygen saturation is 96%.   Wt Readings from Last 3 Encounters:  06/17/17 177 lb (80.3 kg)  06/05/17 178 lb 6.4 oz (80.9 kg)  05/15/17 180 lb 3.2 oz (81.7 kg)     Elderly white female.  she really has a small surgical scar on her right frontal scalp from the craniotomy. She has healed up very well from this. She has no ecchymoses on her face. Her extra-ocular muscles move well. There is no ptosis. She has no intraoral lesions.There is no adenopathy in the neck. Her thyroid is nonpalpable. Lungs are clear bilaterally. Cardiac exam regular rate and rhythm with an occasional extra beat. There is no murmurs, rubs or bruits. Abdomen is soft. She has good bowel sounds. There is no fluid wave. There is no palpable liver or spleen tip. Back exam shows no tenderness over the spine, ribs or hips. Extremities shows some trace edema in her lower extremities. Skin exam shows no rashes, ecchymoses or petechia.  Neurological exam shows no focal neurological deficits.  Lab Results  Component Value Date   WBC 5.7 06/17/2017   HGB 9.3 (L) 06/17/2017   HCT 30.8 (L) 06/17/2017   MCV 81 06/17/2017   PLT 244 06/17/2017     Chemistry      Component Value Date/Time   NA 131 06/17/2017 0936   NA 137 08/08/2016 0948   K 3.6 06/17/2017 0936   K 4.2 08/08/2016 0948   CL 101 06/17/2017 0936   CO2 28 06/17/2017 0936   CO2 23 08/08/2016 0948   BUN 19 06/17/2017 0936   BUN 40.4 (H) 08/08/2016 0948    CREATININE 1.1 06/17/2017 0936   CREATININE 1.4 (H) 08/08/2016 0948      Component Value Date/Time   CALCIUM 11.9 (H) 06/17/2017 0936   CALCIUM 9.6 08/08/2016 0948   ALKPHOS 111 (H) 06/17/2017 0936   ALKPHOS 99 08/08/2016 0948   AST 15 06/17/2017 0936   AST 11 08/08/2016 0948   ALT 10 06/17/2017 0936   ALT 26 08/08/2016 0948   BILITOT 0.50 06/17/2017 0936   BILITOT 0.52 08/08/2016 0948         Impression and Plan:  Ms. Lanza is  an 83 year old white female. She has metastatic kidney cancer. This is clear-cell carcinoma. She presented with a solitary CNS met. This was treated with stereotactic radiosurgery. She had lung metastasis. She has the right kidney as a primary.   I think that it is now becoming much more obvious that her cancer is becoming much more aggressive the hypercalcemia I think is a clear indicator. Her decreasing output and also shows that her cancer is starting to "siphon off" nutrients to itself.  I doubt that she will make it through the month of August. I did not talk to her about this. I will talk to her family about this.  Hospices seen her. I hope that they will be able to help her out and keep her comfortable.  I would like to see her back in 3 weeks. At this point, I think we should have a very good idea as to what we are looking at timewise.  Volanda Napoleon, MD 7/30/201811:47 AM

## 2017-06-17 NOTE — Progress Notes (Signed)
error 

## 2017-06-18 DIAGNOSIS — R531 Weakness: Secondary | ICD-10-CM | POA: Diagnosis not present

## 2017-06-19 ENCOUNTER — Ambulatory Visit (HOSPITAL_COMMUNITY): Payer: Medicare Other

## 2017-06-21 ENCOUNTER — Other Ambulatory Visit: Payer: Self-pay | Admitting: Family

## 2017-06-21 ENCOUNTER — Ambulatory Visit
Admission: RE | Admit: 2017-06-21 | Discharge: 2017-06-21 | Disposition: A | Discharge status: Home / Self Care | Payer: Medicare Other | Source: Ambulatory Visit | Attending: Radiation Oncology | Admitting: Radiation Oncology

## 2017-06-21 DIAGNOSIS — R5383 Other fatigue: Secondary | ICD-10-CM

## 2017-06-21 DIAGNOSIS — N3000 Acute cystitis without hematuria: Secondary | ICD-10-CM

## 2017-06-21 DIAGNOSIS — C649 Malignant neoplasm of unspecified kidney, except renal pelvis: Secondary | ICD-10-CM

## 2017-06-21 DIAGNOSIS — C7951 Secondary malignant neoplasm of bone: Secondary | ICD-10-CM

## 2017-06-21 DIAGNOSIS — C7931 Secondary malignant neoplasm of brain: Secondary | ICD-10-CM

## 2017-06-21 DIAGNOSIS — C7800 Secondary malignant neoplasm of unspecified lung: Principal | ICD-10-CM

## 2017-07-08 ENCOUNTER — Encounter: Payer: Self-pay | Admitting: Family Medicine

## 2017-07-08 DIAGNOSIS — R8290 Unspecified abnormal findings in urine: Secondary | ICD-10-CM

## 2017-07-09 ENCOUNTER — Other Ambulatory Visit: Payer: Medicare Other

## 2017-07-09 DIAGNOSIS — R8290 Unspecified abnormal findings in urine: Secondary | ICD-10-CM | POA: Diagnosis not present

## 2017-07-10 ENCOUNTER — Other Ambulatory Visit (HOSPITAL_BASED_OUTPATIENT_CLINIC_OR_DEPARTMENT_OTHER): Payer: Medicare Other

## 2017-07-10 ENCOUNTER — Ambulatory Visit (HOSPITAL_BASED_OUTPATIENT_CLINIC_OR_DEPARTMENT_OTHER): Payer: Medicare Other | Admitting: Hematology & Oncology

## 2017-07-10 ENCOUNTER — Ambulatory Visit (HOSPITAL_BASED_OUTPATIENT_CLINIC_OR_DEPARTMENT_OTHER): Payer: Medicare Other

## 2017-07-10 VITALS — BP 128/48 | HR 69 | Temp 98.0°F | Resp 16 | Wt 177.0 lb

## 2017-07-10 DIAGNOSIS — C649 Malignant neoplasm of unspecified kidney, except renal pelvis: Secondary | ICD-10-CM

## 2017-07-10 DIAGNOSIS — C641 Malignant neoplasm of right kidney, except renal pelvis: Secondary | ICD-10-CM | POA: Diagnosis not present

## 2017-07-10 DIAGNOSIS — C7801 Secondary malignant neoplasm of right lung: Principal | ICD-10-CM

## 2017-07-10 DIAGNOSIS — D62 Acute posthemorrhagic anemia: Secondary | ICD-10-CM

## 2017-07-10 DIAGNOSIS — C7951 Secondary malignant neoplasm of bone: Secondary | ICD-10-CM | POA: Diagnosis not present

## 2017-07-10 DIAGNOSIS — D509 Iron deficiency anemia, unspecified: Secondary | ICD-10-CM

## 2017-07-10 DIAGNOSIS — C7800 Secondary malignant neoplasm of unspecified lung: Secondary | ICD-10-CM

## 2017-07-10 DIAGNOSIS — C7931 Secondary malignant neoplasm of brain: Secondary | ICD-10-CM

## 2017-07-10 LAB — CBC WITH DIFFERENTIAL (CANCER CENTER ONLY)
BASO#: 0 10*3/uL (ref 0.0–0.2)
BASO%: 0.2 % (ref 0.0–2.0)
EOS%: 0.6 % (ref 0.0–7.0)
Eosinophils Absolute: 0 10*3/uL (ref 0.0–0.5)
HCT: 28.8 % — ABNORMAL LOW (ref 34.8–46.6)
HGB: 8.6 g/dL — ABNORMAL LOW (ref 11.6–15.9)
LYMPH#: 0.8 10*3/uL — ABNORMAL LOW (ref 0.9–3.3)
LYMPH%: 12 % — AB (ref 14.0–48.0)
MCH: 24.3 pg — ABNORMAL LOW (ref 26.0–34.0)
MCHC: 29.9 g/dL — AB (ref 32.0–36.0)
MCV: 81 fL (ref 81–101)
MONO#: 0.4 10*3/uL (ref 0.1–0.9)
MONO%: 5.8 % (ref 0.0–13.0)
NEUT#: 5.1 10*3/uL (ref 1.5–6.5)
NEUT%: 81.4 % — AB (ref 39.6–80.0)
PLATELETS: 339 10*3/uL (ref 145–400)
RBC: 3.54 10*6/uL — ABNORMAL LOW (ref 3.70–5.32)
RDW: 16.9 % — AB (ref 11.1–15.7)
WBC: 6.3 10*3/uL (ref 3.9–10.0)

## 2017-07-10 LAB — CMP (CANCER CENTER ONLY)
ALK PHOS: 109 U/L — AB (ref 26–84)
ALT: 18 U/L (ref 10–47)
AST: 15 U/L (ref 11–38)
Albumin: 2.7 g/dL — ABNORMAL LOW (ref 3.3–5.5)
BUN: 17 mg/dL (ref 7–22)
CO2: 29 meq/L (ref 18–33)
Calcium: 10.2 mg/dL (ref 8.0–10.3)
Chloride: 100 mEq/L (ref 98–108)
Creat: 1.2 mg/dl (ref 0.6–1.2)
GLUCOSE: 185 mg/dL — AB (ref 73–118)
POTASSIUM: 3.9 meq/L (ref 3.3–4.7)
Sodium: 134 mEq/L (ref 128–145)
Total Bilirubin: 0.7 mg/dl (ref 0.20–1.60)
Total Protein: 6.6 g/dL (ref 6.4–8.1)

## 2017-07-10 MED ORDER — HEPARIN SOD (PORK) LOCK FLUSH 100 UNIT/ML IV SOLN
500.0000 [IU] | Freq: Once | INTRAVENOUS | Status: AC
  Administered 2017-07-10: 500 [IU] via INTRAVENOUS
  Filled 2017-07-10: qty 5

## 2017-07-10 MED ORDER — DENOSUMAB 120 MG/1.7ML ~~LOC~~ SOLN
120.0000 mg | Freq: Once | SUBCUTANEOUS | Status: AC
  Administered 2017-07-10: 120 mg via SUBCUTANEOUS

## 2017-07-10 MED ORDER — DENOSUMAB 120 MG/1.7ML ~~LOC~~ SOLN
SUBCUTANEOUS | Status: AC
  Filled 2017-07-10: qty 1.7

## 2017-07-10 MED ORDER — SODIUM CHLORIDE 0.9% FLUSH
10.0000 mL | INTRAVENOUS | Status: DC | PRN
  Administered 2017-07-10: 10 mL via INTRAVENOUS
  Filled 2017-07-10: qty 10

## 2017-07-10 NOTE — Progress Notes (Signed)
Hematology and Oncology Follow Up Visit  DAVIELLE LINGELBACH [id number] January 22, 1925 83 y.o. 07/10/2017   Principle Diagnosis:   Metastatic  RIGHT renal cell carcinoma-progressive  Paroxysmal atrial fibrillation  Hypercalcemia of malignancy  Current Therapy:    Votrient 400 mg by mouth daily  Nivolumab 217m IV q 2 week - start 10/30/2016 - s/p c#3  Status post stereotactic radiosurgery for solitary brain met  ELIQUIS 2.5 mg by mouth daily  Xgeva 120 mg subcutaneous every month  Palliative radiation to the left femur - 1 dose of 8 Gy     Interim History:  Ms. BFaraciis back for follow-up. I am surprised as to how well she actually looks. She seems to be maintaining herself.  Her daughter just had a grandson born. This actually was 4 days ago. I saw pictures. It was a huge event for Mrs. BOwens Shark  She is eating okay. She is not eating a lot. She's not getting sick. She's not having problems with diarrhea. She's having no bleeding. She's having no cough or shortness of breath. She does have some bruises. Thank you, she's not fallen.  She's had no headache. She's had no visual difficulties.  Overall, her performance status is ECOG 3.  Medications:  Current Outpatient Prescriptions:  .  acetaminophen (TYLENOL) 500 MG tablet, Take 500 mg by mouth every 6 (six) hours as needed (for pain.). , Disp: , Rfl:  .  [employer] MELATONIN 5 MG TABS, Take 5 mg by mouth at bedtime as needed (for sleep.). , Disp: , Rfl: 2 .  docusate sodium (COLACE) 100 MG capsule, Take 100 mg by mouth 2 (two) times daily., Disp: , Rfl:  .  ELIQUIS 2.5 MG TABS tablet, TAKE 1 TABLET (2.5 MG TOTAL) BY MOUTH 2 (TWO) TIMES DAILY. (Patient not taking: Reported on 06/05/2017), Disp: 180 tablet, Rfl: 2 .  hydrocortisone (CORTEF) 10 MG tablet, TAKE 1 TABLET BY MOUTH TWICE A DAY, Disp: 60 tablet, Rfl: 1 .  isosorbide mononitrate (IMDUR) 30 MG 24 hr tablet, TAKE 1 TABLET BY MOUTH TWICE A DAY, Disp: 60 tablet, Rfl: 11 .  lactulose  (CHRONULAC) 10 GM/15ML solution, Take 15 mLs (10 g total) by mouth every 4 (four) hours. Take until BM, Disp: 473 mL, Rfl: 0 .  lidocaine-prilocaine (EMLA) cream, Apply 1 application topically daily as needed (prior to port begin access)., Disp: , Rfl:  .  metoCLOPramide (REGLAN) 5 MG tablet, Take 1 tablet (5 mg total) by mouth 3 (three) times daily before meals., Disp: 90 tablet, Rfl: 3 .  metoprolol succinate (TOPROL-XL) 50 MG 24 hr tablet, Take 1 tablet (50 mg total) by mouth every morning. Take with or immediately following a meal., Disp: 90 tablet, Rfl: 3 .  nitroGLYCERIN (NITROSTAT) 0.4 MG SL tablet, Place 1 tablet (0.4 mg total) under the tongue every 5 (five) minutes as needed for chest pain (MAX 3 TABLETS)., Disp: 25 tablet, Rfl: 1 .  pantoprazole (PROTONIX) 40 MG tablet, TAKE 1 TABLET (40 MG TOTAL) BY MOUTH DAILY., Disp: 30 tablet, Rfl: 2 .  polyethylene glycol (MIRALAX / GLYCOLAX) packet, Take 17 g by mouth daily as needed (constipation). Mix in 8 oz liquid and drink , Disp: , Rfl:  .  polyvinyl alcohol (LIQUIFILM TEARS) 1.4 % ophthalmic solution, Place 1 drop into both eyes every hour as needed for dry eyes., Disp: 15 mL, Rfl: 0 .  temazepam (RESTORIL) 15 MG capsule, TAKE 1 CAPSULE BY MOUTH AT BEDTIME AS NEEDED FOR INSOMNIA MAY REPEAT X  1 DOSE, Disp: , Rfl: 2 .  vitamin E 400 UNIT capsule, Take 400 Units by mouth as directed. Take 1 capsule daily for one week then start taking 1 capsule twice daily, Disp: , Rfl:  No current facility-administered medications for this visit.   Facility-Administered Medications Ordered in Other Visits:  .  sodium chloride flush (NS) 0.9 % injection 10 mL, 10 mL, Intravenous, PRN, [place], Holli Humbles, NP, 10 mL at 12/26/16 1447  Allergies:  Allergies  Allergen Reactions  . Cefpodoxime Other (See Comments)    Nocturia & disturbed sleep  . Codeine Nausea Only    Past Medical History, Surgical history, Social history, and Family History were reviewed and  updated as in the interim history.  Review of Systems: A complete review of systems is presented in the interim history  Physical Exam:  weight is 177 lb (80.3 kg). Her oral temperature is 98 F (36.7 C). Her blood pressure is 128/48 (abnormal) and her pulse is 69. Her respiration is 16 and oxygen saturation is 100%.   Wt Readings from Last 3 Encounters:  07/10/17 177 lb (80.3 kg)  06/17/17 177 lb (80.3 kg)  06/05/17 178 lb 6.4 oz (80.9 kg)     Elderly white female. She is alert and oriented. Head and neck exam shows no ocular or oral lesions. There are no palpable cervical or cervical or supraclavicular lymph nodes. Lungs are clear. Cardiac exam regular rate and rhythm. She has occasional extra beat. She has a 1/6 systolic murmur. Abdomen is soft. She has good bowel sounds. There is no fluid wave. There is no palpable liver or spleen tip. Back exam shows no tenderness over the spine, ribs or hips. Extrema shows no clubbing, cyanosis or edema. She has decent range of motion of her joints area and skin exam shows scattered ecchymoses. Neurological exam shows no focal neurological deficits.   Lab Results  Component Value Date   WBC 5.7 06/17/2017   HGB 9.3 (L) 06/17/2017   HCT 30.8 (L) 06/17/2017   MCV 81 06/17/2017   PLT 244 06/17/2017     Chemistry      Component Value Date/Time   NA 131 06/17/2017 0936   NA 137 08/08/2016 0948   K 3.6 06/17/2017 0936   K 4.2 08/08/2016 0948   CL 101 06/17/2017 0936   CO2 28 06/17/2017 0936   CO2 23 08/08/2016 0948   BUN 19 06/17/2017 0936   BUN 40.4 (H) 08/08/2016 0948   CREATININE 1.1 06/17/2017 0936   CREATININE 1.4 (H) 08/08/2016 0948      Component Value Date/Time   CALCIUM 11.9 (H) 06/17/2017 0936   CALCIUM 9.6 08/08/2016 0948   ALKPHOS 111 (H) 06/17/2017 0936   ALKPHOS 99 08/08/2016 0948   AST 15 06/17/2017 0936   AST 11 08/08/2016 0948   ALT 10 06/17/2017 0936   ALT 26 08/08/2016 0948   BILITOT 0.50 06/17/2017 0936   BILITOT  0.52 08/08/2016 0948         Impression and Plan: Ms. Mckowen is  an 83 year old white female With metastatic clear cell carcinoma of the kidney. She is being followed at home by hospice. We are giving her Delton See because of hypercalcemia. Her labs today still show some element of hypercalcemia. I want to control this so that she has a good quality of life.  I'm just surprised that she looks as good as she does. She really is maintaining herself. I told her and her daughter that it's  about her weight and the weight will show [place] how she is doing.   I am somewhat surprised as to her anemia. She is quite anemic. I suspect that her iron level is low. I looked at her blood smear. This appear to be consistent with iron deficiency. We will see what her iron levels are. If they are low, we will give her IV iron.  We will go ahead and give her Xgeva today. I think this will be helpful to prevent complications from hypercalcemia.   I will see her back in another month.  I spent a good 25 minutes with her. The vast majority of this was all can about her malignancy, the elevated calcium, and how we can continue to maintain her quality of life. She and her daughter asked questions. They understood what I was saying and agree with our plan.   Volanda Napoleon, MD 8/22/20183:06 PM

## 2017-07-11 ENCOUNTER — Telehealth: Payer: Self-pay | Admitting: *Deleted

## 2017-07-11 ENCOUNTER — Encounter: Payer: Self-pay | Admitting: Family Medicine

## 2017-07-11 LAB — IRON AND TIBC
%SAT: 11 % — ABNORMAL LOW (ref 21–57)
Iron: 23 ug/dL — ABNORMAL LOW (ref 41–142)
TIBC: 203 ug/dL — ABNORMAL LOW (ref 236–444)
UIBC: 180 ug/dL (ref 120–384)

## 2017-07-11 LAB — FERRITIN: Ferritin: 986 ng/ml — ABNORMAL HIGH (ref 9–269)

## 2017-07-11 LAB — URINE CULTURE

## 2017-07-11 NOTE — Telephone Encounter (Addendum)
Patient's daughter aware of results. Appointment made  ----- Message from Volanda Napoleon, MD sent at 07/11/2017  1:09 PM EDT ----- Call - Iron level is low!!  Need 1 dose of feraheme!!  Please call dgtr to set up!!!  Laurey Arrow

## 2017-07-12 LAB — PREALBUMIN

## 2017-07-16 ENCOUNTER — Ambulatory Visit (HOSPITAL_BASED_OUTPATIENT_CLINIC_OR_DEPARTMENT_OTHER): Payer: Medicare Other

## 2017-07-16 ENCOUNTER — Other Ambulatory Visit: Payer: Self-pay | Admitting: Family

## 2017-07-16 VITALS — BP 132/48 | HR 79 | Temp 97.7°F | Resp 18

## 2017-07-16 DIAGNOSIS — C649 Malignant neoplasm of unspecified kidney, except renal pelvis: Secondary | ICD-10-CM

## 2017-07-16 DIAGNOSIS — D508 Other iron deficiency anemias: Secondary | ICD-10-CM

## 2017-07-16 DIAGNOSIS — D509 Iron deficiency anemia, unspecified: Secondary | ICD-10-CM | POA: Insufficient documentation

## 2017-07-16 DIAGNOSIS — C7800 Secondary malignant neoplasm of unspecified lung: Principal | ICD-10-CM

## 2017-07-16 MED ORDER — SODIUM CHLORIDE 0.9 % IV SOLN
510.0000 mg | Freq: Once | INTRAVENOUS | Status: AC
  Administered 2017-07-16: 510 mg via INTRAVENOUS
  Filled 2017-07-16: qty 17

## 2017-07-16 NOTE — Patient Instructions (Signed)

## 2017-07-22 ENCOUNTER — Other Ambulatory Visit: Payer: Self-pay | Admitting: Family

## 2017-07-22 DIAGNOSIS — C649 Malignant neoplasm of unspecified kidney, except renal pelvis: Secondary | ICD-10-CM

## 2017-07-22 DIAGNOSIS — C7951 Secondary malignant neoplasm of bone: Secondary | ICD-10-CM

## 2017-07-22 DIAGNOSIS — N3000 Acute cystitis without hematuria: Secondary | ICD-10-CM

## 2017-07-22 DIAGNOSIS — C7800 Secondary malignant neoplasm of unspecified lung: Principal | ICD-10-CM

## 2017-07-22 DIAGNOSIS — C7931 Secondary malignant neoplasm of brain: Secondary | ICD-10-CM

## 2017-07-22 DIAGNOSIS — R5383 Other fatigue: Secondary | ICD-10-CM

## 2017-08-06 ENCOUNTER — Other Ambulatory Visit: Payer: Self-pay | Admitting: *Deleted

## 2017-08-06 DIAGNOSIS — C649 Malignant neoplasm of unspecified kidney, except renal pelvis: Secondary | ICD-10-CM

## 2017-08-06 DIAGNOSIS — N183 Chronic kidney disease, stage 3 unspecified: Secondary | ICD-10-CM

## 2017-08-06 DIAGNOSIS — N390 Urinary tract infection, site not specified: Secondary | ICD-10-CM

## 2017-08-06 DIAGNOSIS — C7800 Secondary malignant neoplasm of unspecified lung: Principal | ICD-10-CM

## 2017-08-07 ENCOUNTER — Ambulatory Visit (HOSPITAL_BASED_OUTPATIENT_CLINIC_OR_DEPARTMENT_OTHER): Payer: Medicare Other

## 2017-08-07 ENCOUNTER — Other Ambulatory Visit (HOSPITAL_BASED_OUTPATIENT_CLINIC_OR_DEPARTMENT_OTHER): Payer: Medicare Other

## 2017-08-07 ENCOUNTER — Ambulatory Visit (HOSPITAL_BASED_OUTPATIENT_CLINIC_OR_DEPARTMENT_OTHER): Payer: Medicare Other | Admitting: Hematology & Oncology

## 2017-08-07 ENCOUNTER — Ambulatory Visit: Payer: Medicare Other

## 2017-08-07 VITALS — BP 107/49 | HR 72 | Temp 97.7°F | Resp 18 | Wt 165.0 lb

## 2017-08-07 DIAGNOSIS — N183 Chronic kidney disease, stage 3 unspecified: Secondary | ICD-10-CM

## 2017-08-07 DIAGNOSIS — C649 Malignant neoplasm of unspecified kidney, except renal pelvis: Secondary | ICD-10-CM

## 2017-08-07 DIAGNOSIS — N390 Urinary tract infection, site not specified: Secondary | ICD-10-CM

## 2017-08-07 DIAGNOSIS — E860 Dehydration: Secondary | ICD-10-CM

## 2017-08-07 DIAGNOSIS — C641 Malignant neoplasm of right kidney, except renal pelvis: Secondary | ICD-10-CM

## 2017-08-07 DIAGNOSIS — C7800 Secondary malignant neoplasm of unspecified lung: Secondary | ICD-10-CM | POA: Diagnosis not present

## 2017-08-07 DIAGNOSIS — C799 Secondary malignant neoplasm of unspecified site: Secondary | ICD-10-CM

## 2017-08-07 DIAGNOSIS — D62 Acute posthemorrhagic anemia: Secondary | ICD-10-CM

## 2017-08-07 DIAGNOSIS — D508 Other iron deficiency anemias: Secondary | ICD-10-CM

## 2017-08-07 LAB — CBC WITH DIFFERENTIAL (CANCER CENTER ONLY)
BASO#: 0 10*3/uL (ref 0.0–0.2)
BASO%: 0.3 % (ref 0.0–2.0)
EOS ABS: 0 10*3/uL (ref 0.0–0.5)
EOS%: 0.4 % (ref 0.0–7.0)
HCT: 30.8 % — ABNORMAL LOW (ref 34.8–46.6)
HEMOGLOBIN: 9.4 g/dL — AB (ref 11.6–15.9)
LYMPH#: 0.7 10*3/uL — ABNORMAL LOW (ref 0.9–3.3)
LYMPH%: 10.4 % — ABNORMAL LOW (ref 14.0–48.0)
MCH: 24.7 pg — AB (ref 26.0–34.0)
MCHC: 30.5 g/dL — ABNORMAL LOW (ref 32.0–36.0)
MCV: 81 fL (ref 81–101)
MONO#: 0.6 10*3/uL (ref 0.1–0.9)
MONO%: 8.9 % (ref 0.0–13.0)
NEUT%: 80 % (ref 39.6–80.0)
NEUTROS ABS: 5.7 10*3/uL (ref 1.5–6.5)
Platelets: 295 10*3/uL (ref 145–400)
RBC: 3.81 10*6/uL (ref 3.70–5.32)
RDW: 17.2 % — ABNORMAL HIGH (ref 11.1–15.7)
WBC: 7.1 10*3/uL (ref 3.9–10.0)

## 2017-08-07 LAB — URINALYSIS, MICROSCOPIC (CHCC SATELLITE)
BILIRUBIN (URINE): NEGATIVE
BLOOD: NEGATIVE
Glucose: NEGATIVE mg/dL
Ketones: NEGATIVE mg/dL
Nitrite: POSITIVE
Protein: NEGATIVE mg/dL
SPECIFIC GRAVITY, URINE: 1.01 (ref 1.003–1.035)
Urobilinogen, UR: 0.2 mg/dL (ref 0.2–1)
pH: 6.5 (ref 4.60–8.00)

## 2017-08-07 LAB — CMP (CANCER CENTER ONLY)
ALBUMIN: 2.8 g/dL — AB (ref 3.3–5.5)
ALT(SGPT): 28 U/L (ref 10–47)
AST: 19 U/L (ref 11–38)
Alkaline Phosphatase: 141 U/L — ABNORMAL HIGH (ref 26–84)
BILIRUBIN TOTAL: 0.7 mg/dL (ref 0.20–1.60)
BUN, Bld: 19 mg/dL (ref 7–22)
CO2: 29 meq/L (ref 18–33)
CREATININE: 1.2 mg/dL (ref 0.6–1.2)
Calcium: 11.2 mg/dL — ABNORMAL HIGH (ref 8.0–10.3)
Chloride: 96 mEq/L — ABNORMAL LOW (ref 98–108)
GLUCOSE: 102 mg/dL (ref 73–118)
Potassium: 4.2 mEq/L (ref 3.3–4.7)
SODIUM: 133 meq/L (ref 128–145)
TOTAL PROTEIN: 6.6 g/dL (ref 6.4–8.1)

## 2017-08-07 MED ORDER — SODIUM CHLORIDE 0.9 % IV SOLN: Freq: Once | INTRAVENOUS | Status: AC

## 2017-08-07 MED ORDER — DENOSUMAB 120 MG/1.7ML ~~LOC~~ SOLN
SUBCUTANEOUS | Status: AC
  Filled 2017-08-07: qty 1.7

## 2017-08-07 MED ORDER — DENOSUMAB 120 MG/1.7ML ~~LOC~~ SOLN
120.0000 mg | Freq: Once | SUBCUTANEOUS | Status: AC
Start: 2017-08-07 — End: 2017-08-07
  Administered 2017-08-07: 120 mg via SUBCUTANEOUS

## 2017-08-07 MED ORDER — DIPHENHYDRAMINE HCL 50 MG/ML IJ SOLN: 25.0000 mg | Freq: Once | INTRAMUSCULAR | Status: DC | PRN

## 2017-08-07 MED ORDER — SODIUM CHLORIDE 0.9 % IV SOLN
INTRAVENOUS | Status: AC
  Administered 2017-08-07: 15:00:00 via INTRAVENOUS

## 2017-08-07 MED ORDER — EPINEPHRINE HCL 0.1 MG/ML IJ SOLN: 0.2500 mg | Freq: Once | INTRAMUSCULAR | Status: DC | PRN

## 2017-08-07 MED ORDER — LEVOFLOXACIN 250 MG PO TABS: 250.0000 mg | ORAL_TABLET | Freq: Every day | ORAL | 1 refills | Status: DC

## 2017-08-07 MED ORDER — METHYLPREDNISOLONE SODIUM SUCC 125 MG IJ SOLR: 125.0000 mg | Freq: Once | INTRAMUSCULAR | Status: DC | PRN

## 2017-08-07 MED ORDER — SODIUM CHLORIDE 0.9 % IV SOLN: Freq: Once | INTRAVENOUS | Status: DC | PRN

## 2017-08-07 MED ORDER — DIPHENHYDRAMINE HCL 50 MG/ML IJ SOLN: 50.0000 mg | Freq: Once | INTRAMUSCULAR | Status: DC | PRN

## 2017-08-07 MED ORDER — ALBUTEROL SULFATE (2.5 MG/3ML) 0.083% IN NEBU
2.5000 mg | INHALATION_SOLUTION | Freq: Once | RESPIRATORY_TRACT | Status: DC | PRN
  Filled 2017-08-07: qty 3

## 2017-08-07 MED ORDER — SODIUM CHLORIDE 0.9% FLUSH
10.0000 mL | INTRAVENOUS | Status: DC | PRN
  Filled 2017-08-07: qty 10

## 2017-08-07 MED ORDER — SODIUM CHLORIDE 0.9% FLUSH
3.0000 mL | Freq: Once | INTRAVENOUS | Status: DC | PRN
  Filled 2017-08-07: qty 10

## 2017-08-07 MED ORDER — LEVOFLOXACIN IN D5W 500 MG/100ML IV SOLN
500.0000 mg | Freq: Once | INTRAVENOUS | Status: AC
  Administered 2017-08-07: 500 mg via INTRAVENOUS
  Filled 2017-08-07: qty 100

## 2017-08-07 NOTE — Progress Notes (Signed)
Hematology and Oncology Follow Up Visit  Brooke Rios [id number] 1925/02/24 83 y.o. 08/07/2017   Principle Diagnosis:   Metastatic  RIGHT renal cell carcinoma-progressive  Paroxysmal atrial fibrillation  Hypercalcemia of malignancy  Current Therapy:    Votrient 400 mg by mouth daily  Nivolumab 261m IV q 2 week - start 10/30/2016 - s/p c#3  Status post stereotactic radiosurgery for solitary brain met  ELIQUIS 2.5 mg by mouth daily  Xgeva 120 mg subcutaneous every month  Palliative radiation to the left femur - 1 dose of 8 Gy     Interim History:  Brooke Rios back for follow-up. She is still declining. Her weight is down. She is not eating as much. She sleeps quite a bit.  I suspect that she probably has hypercalcemia again. In fact, her corrected calcium today was a little over 13.  She just doesn't feel like eating. She also has burning with urination. She has dark urine. It is somewhat odiferous. We did a urinalysis and clearly, she has a urinary tract infection.  She's had no obvious fever. This is not surprising because she is on hydrocortisone.  She's had no bleeding. She's had no vomiting. She may have a little bit of nausea.  There's been no visual changes. She's had no headache. She has not fallen.  Currently, her performance status is ECOG 3.    Medications:  Current Outpatient Prescriptions:  .  acetaminophen (TYLENOL) 500 MG tablet, Take 500 mg by mouth every 6 (six) hours as needed (for pain.). , Disp: , Rfl:  .  [employer] MELATONIN 5 MG TABS, Take 5 mg by mouth at bedtime as needed (for sleep.). , Disp: , Rfl: 2 .  ELIQUIS 2.5 MG TABS tablet, TAKE 1 TABLET (2.5 MG TOTAL) BY MOUTH 2 (TWO) TIMES DAILY. (Patient taking differently: Take 2.5 mg by mouth daily. ), Disp: 180 tablet, Rfl: 2 .  hydrocortisone (CORTEF) 10 MG tablet, TAKE 1 TABLET BY MOUTH TWICE A DAY, Disp: 60 tablet, Rfl: 1 .  lactulose (CHRONULAC) 10 GM/15ML solution, Take 15 mLs (10 g total) by  mouth every 4 (four) hours. Take until BM, Disp: 473 mL, Rfl: 0 .  lidocaine-prilocaine (EMLA) cream, Apply 1 application topically daily as needed (prior to port begin access)., Disp: , Rfl:  .  metoCLOPramide (REGLAN) 5 MG tablet, Take 1 tablet (5 mg total) by mouth 3 (three) times daily before meals. (Patient taking differently: Take 5 mg by mouth 3 (three) times daily before meals. ), Disp: 90 tablet, Rfl: 3 .  metoprolol succinate (TOPROL-XL) 50 MG 24 hr tablet, Take 1 tablet (50 mg total) by mouth every morning. Take with or immediately following a meal., Disp: 90 tablet, Rfl: 3 .  nitroGLYCERIN (NITROSTAT) 0.4 MG SL tablet, Place 1 tablet (0.4 mg total) under the tongue every 5 (five) minutes as needed for chest pain (MAX 3 TABLETS)., Disp: 25 tablet, Rfl: 1 .  pantoprazole (PROTONIX) 40 MG tablet, TAKE 1 TABLET (40 MG TOTAL) BY MOUTH DAILY., Disp: 30 tablet, Rfl: 2 .  polyethylene glycol (MIRALAX / GLYCOLAX) packet, Take 17 g by mouth daily as needed (constipation). Mix in 8 oz liquid and drink , Disp: , Rfl:  .  polyvinyl alcohol (LIQUIFILM TEARS) 1.4 % ophthalmic solution, Place 1 drop into both eyes every hour as needed for dry eyes., Disp: 15 mL, Rfl: 0 .  temazepam (RESTORIL) 15 MG capsule, TAKE 1 CAPSULE BY MOUTH AT BEDTIME AS NEEDED FOR INSOMNIA MAY REPEAT  X 1 DOSE, Disp: , Rfl: 2 .  vitamin E 400 UNIT capsule, Take 400 Units by mouth as directed. Take 1 capsule daily for one week then start taking 1 capsule twice daily, Disp: , Rfl:  .  levofloxacin (LEVAQUIN) 250 MG tablet, Take 1 tablet (250 mg total) by mouth daily., Disp: 5 tablet, Rfl: 1 No current facility-administered medications for this visit.   Facility-Administered Medications Ordered in Other Visits:  .  albuterol (PROVENTIL) (2.5 MG/3ML) 0.083% nebulizer solution 2.5 mg, 2.5 mg, Nebulization, Once PRN, Ennever, Rudell Cobb, MD .  denosumab (XGEVA) injection 120 mg, 120 mg, Subcutaneous, Once, Ennever, Rudell Cobb, MD .   diphenhydrAMINE (BENADRYL) injection 25 mg, 25 mg, Intravenous, Once PRN, Ennever, Rudell Cobb, MD .  diphenhydrAMINE (BENADRYL) injection 50 mg, 50 mg, Intravenous, Once PRN, Volanda Napoleon, MD .  sodium chloride flush (NS) 0.9 % injection 10 mL, 10 mL, Intravenous, PRN, [place], Sarah M, NP, 10 mL at 12/26/16 1447 .  sodium chloride flush (NS) 0.9 % injection 10 mL, 10 mL, Intracatheter, PRN, [place], Sarah M, NP .  sodium chloride flush (NS) 0.9 % injection 3 mL, 3 mL, Intravenous, Once PRN, [place], Holli Humbles, NP  Allergies:  Allergies  Allergen Reactions  . Cefpodoxime Other (See Comments)    Nocturia & disturbed sleep  . Codeine Nausea Only    Past Medical History, Surgical history, Social history, and Family History were reviewed and updated as in the interim history.  Review of Systems: As stated in the interim history  Physical Exam:  weight is 165 lb (74.8 kg). Her oral temperature is 97.7 F (36.5 C). Her blood pressure is 107/49 (abnormal) and her pulse is 72. Her respiration is 18 and oxygen saturation is 97%.   Wt Readings from Last 3 Encounters:  08/07/17 165 lb (74.8 kg)  07/10/17 177 lb (80.3 kg)  06/17/17 177 lb (80.3 kg)     Elderly white female in no obvious distress. Head and neck exam shows no ocular or oral lesions. Her oral mucosa is dry. There is no scleral icterus. There is no adenopathy in the neck area and thyroid is non-palpable. Lungs are clear bilaterally. Cardiac exam regular rate and rhythm with a 1/6 systolic ejection murmur. Abdomen is soft issues good bowel sounds. There is no fluid wave. There is no palpable liver or spleen tip. Back exam shows no tenderness over the spine, ribs or hips. Extremities shows no clubbing, cyanosis or edema. She does have some decreased strength in her arms and legs bilaterally. Skin exam shows some scattered ecchymoses. Neurological exam shows no obvious neurological deficits.  Lab Results  Component Value Date     WBC 7.1 08/07/2017   HGB 9.4 (L) 08/07/2017   HCT 30.8 (L) 08/07/2017   MCV 81 08/07/2017   PLT 295 08/07/2017     Chemistry      Component Value Date/Time   NA 133 08/07/2017 1306   NA 137 08/08/2016 0948   K 4.2 08/07/2017 1306   K 4.2 08/08/2016 0948   CL 96 (L) 08/07/2017 1306   CO2 29 08/07/2017 1306   CO2 23 08/08/2016 0948   BUN 19 08/07/2017 1306   BUN 40.4 (H) 08/08/2016 0948   CREATININE 1.2 08/07/2017 1306   CREATININE 1.4 (H) 08/08/2016 0948      Component Value Date/Time   CALCIUM 11.2 (H) 08/07/2017 1306   CALCIUM 9.6 08/08/2016 0948   ALKPHOS 141 (H) 08/07/2017 1306   ALKPHOS 99  08/08/2016 0948   AST 19 08/07/2017 1306   AST 11 08/08/2016 0948   ALT 28 08/07/2017 1306   ALT 26 08/08/2016 0948   BILITOT 0.70 08/07/2017 1306   BILITOT 0.52 08/08/2016 0948         Impression and Plan: Brooke Rios is  an 83 year old white female  with metastatic clear cell carcinoma of the kidney. She is being followed at home by hospice. We are giving her Delton See because of hypercalcemia. Her labs today still show some element of hypercalcemia. I want to control this so that she has a good quality of life.  She also has a urinary tract infection. As such, I gave her a dose of Levaquin via IV. I will also put her on oral Levaquin. I sent in a prescription for 250 mg by mouth daily for 5 days.  She is a little dehydrated. We'll give her some IV fluids.  Again, I just want her to have a good quality of life. As such, I think treating the reversible situations is reasonable and warranted. Her family agrees with this.  She is declining. The weight tells me this.  I will like to see her back in 2 weeks.  I spent about 35 minutes with she and her 2 daughters. I answered all their questions. They are very thankful and grateful that our office staff is so kind and compassionate toward Brooke Rios.   Volanda Napoleon, MD 9/19/20185:12 PM

## 2017-08-07 NOTE — Patient Instructions (Signed)
Dehydration, Adult Dehydration is when there is not enough fluid or water in your body. This happens when you lose more fluids than you take in. Dehydration can range from mild to very bad. It should be treated right away to keep it from getting very bad. Symptoms of mild dehydration may include:  Thirst.  Dry lips.  Slightly dry mouth.  Dry, warm skin.  Dizziness. Symptoms of moderate dehydration may include:  Very dry mouth.  Muscle cramps.  Dark pee (urine). Pee may be the color of tea.  Your body making less pee.  Your eyes making fewer tears.  Heartbeat that is uneven or faster than normal (palpitations).  Headache.  Light-headedness, especially when you stand up from sitting.  Fainting (syncope). Symptoms of very bad dehydration may include:  Changes in skin, such as: ? Cold and clammy skin. ? Blotchy (mottled) or pale skin. ? Skin that does not quickly return to normal after being lightly pinched and let go (poor skin turgor).  Changes in body fluids, such as: ? Feeling very thirsty. ? Your eyes making fewer tears. ? Not sweating when body temperature is high, such as in hot weather. ? Your body making very little pee.  Changes in vital signs, such as: ? Weak pulse. ? Pulse that is more than 100 beats a minute when you are sitting still. ? Fast breathing. ? Low blood pressure.  Other changes, such as: ? Sunken eyes. ? Cold hands and feet. ? Confusion. ? Lack of energy (lethargy). ? Trouble waking up from sleep. ? Short-term weight loss. ? Unconsciousness. Follow these instructions at home:  If told by your doctor, drink an ORS: ? Make an ORS by using instructions on the package. ? Start by drinking small amounts, about  cup (120 mL) every 5-10 minutes. ? Slowly drink more until you have had the amount that your doctor said to have.  Drink enough clear fluid to keep your pee clear or pale yellow. If you were told to drink an ORS, finish the ORS  first, then start slowly drinking clear fluids. Drink fluids such as: ? Water. Do not drink only water by itself. Doing that can make the salt (sodium) level in your body get too low (hyponatremia). ? Ice chips. ? Fruit juice that you have added water to (diluted). ? Low-calorie sports drinks.  Avoid: ? Alcohol. ? Drinks that have a lot of sugar. These include high-calorie sports drinks, fruit juice that does not have water added, and soda. ? Caffeine. ? Foods that are greasy or have a lot of fat or sugar.  Take over-the-counter and prescription medicines only as told by your doctor.  Do not take salt tablets. Doing that can make the salt level in your body get too high (hypernatremia).  Eat foods that have minerals (electrolytes). Examples include bananas, oranges, potatoes, tomatoes, and spinach.  Keep all follow-up visits as told by your doctor. This is important. Contact a doctor if:  You have belly (abdominal) pain that: ? Gets worse. ? Stays in one area (localizes).  You have a rash.  You have a stiff neck.  You get angry or annoyed more easily than normal (irritability).  You are more sleepy than normal.  You have a harder time waking up than normal.  You feel: ? Weak. ? Dizzy. ? Very thirsty.  You have peed (urinated) only a small amount of very dark pee during 6-8 hours. Get help right away if:  You have symptoms of   very bad dehydration.  You cannot drink fluids without throwing up (vomiting).  Your symptoms get worse with treatment.  You have a fever.  You have a very bad headache.  You are throwing up or having watery poop (diarrhea) and it: ? Gets worse. ? Does not go away.  You have blood or something green (bile) in your throw-up.  You have blood in your poop (stool). This may cause poop to look black and tarry.  You have not peed in 6-8 hours.  You pass out (faint).  Your heart rate when you are sitting still is more than 100 beats a  minute.  You have trouble breathing. This information is not intended to replace advice given to you by your health care provider. Make sure you discuss any questions you have with your health care provider. Document Released: 09/01/2009 Document Revised: 05/25/2016 Document Reviewed: 12/30/2015 Elsevier Interactive Patient Education  2018 [employer]  

## 2017-08-07 NOTE — Addendum Note (Signed)
Addended by: Volanda Napoleon on: 08/07/2017 06:18 PM   Modules accepted: Orders

## 2017-08-08 ENCOUNTER — Other Ambulatory Visit: Payer: Self-pay | Admitting: *Deleted

## 2017-08-08 DIAGNOSIS — N390 Urinary tract infection, site not specified: Secondary | ICD-10-CM

## 2017-08-08 LAB — FERRITIN

## 2017-08-08 LAB — LACTATE DEHYDROGENASE: LDH: 153 U/L (ref 125–245)

## 2017-08-08 LAB — IRON AND TIBC
%SAT: 11 % — AB (ref 21–57)
IRON: 22 ug/dL — AB (ref 41–142)
TIBC: 203 ug/dL — AB (ref 236–444)
UIBC: 181 ug/dL (ref 120–384)

## 2017-08-08 MED ORDER — LEVOFLOXACIN 250 MG PO TABS: 250.0000 mg | ORAL_TABLET | Freq: Every day | ORAL | 1 refills | Status: DC

## 2017-08-09 ENCOUNTER — Telehealth: Payer: Self-pay | Admitting: *Deleted

## 2017-08-09 ENCOUNTER — Other Ambulatory Visit: Payer: Self-pay | Admitting: *Deleted

## 2017-08-09 DIAGNOSIS — M1712 Unilateral primary osteoarthritis, left knee: Secondary | ICD-10-CM | POA: Diagnosis not present

## 2017-08-09 LAB — URINE CULTURE

## 2017-08-09 MED ORDER — SULFAMETHOXAZOLE-TRIMETHOPRIM 400-80 MG PO TABS: 1.0000 | ORAL_TABLET | Freq: Two times a day (BID) | ORAL | 0 refills | Status: AC

## 2017-08-09 NOTE — Telephone Encounter (Signed)
Patient daughter notified of this.  She will call our scheduler and make appointment with her next office visit and lab.

## 2017-08-09 NOTE — Telephone Encounter (Signed)
-----   Message from Volanda Napoleon, MD sent at 08/08/2017 12:53 PM EDT ----- Call her dgtr -  the iron is quite low. This also might explain some of her symptoms. We need to give her 1 dose of Feraheme. Please set this up.  Thanks

## 2017-08-13 ENCOUNTER — Encounter: Payer: Self-pay | Admitting: Hematology & Oncology

## 2017-08-13 ENCOUNTER — Other Ambulatory Visit: Payer: Self-pay | Admitting: *Deleted

## 2017-08-14 ENCOUNTER — Telehealth: Payer: Self-pay | Admitting: *Deleted

## 2017-08-14 NOTE — Telephone Encounter (Signed)
Patient is having difficulty with constipation. She hasn't had a BM for several days. She has developed hemorrhoids and has had some bleeding when trying to pass stool. They have treated with miralax each night, and then 4 doses of lactulose since Monday. They would like to know what they can try next.  Dr Marin Olp would like the patient to increase the Miralax to twice daily. He would also like the patient to take lactulose 20ml q4h today. If there is isn't results by this evening he would like the patient to have a fleets enema.   Brooke Rios understands all instructions, confirmed with teach back. She doesn't need new prescription sent. Also instructed to stop lactulose when patient starts having BMs to avoid diarrhea.

## 2017-08-17 ENCOUNTER — Other Ambulatory Visit: Payer: Self-pay | Admitting: Hematology & Oncology

## 2017-08-17 DIAGNOSIS — R410 Disorientation, unspecified: Secondary | ICD-10-CM

## 2017-08-17 DIAGNOSIS — C7801 Secondary malignant neoplasm of right lung: Principal | ICD-10-CM

## 2017-08-17 DIAGNOSIS — C649 Malignant neoplasm of unspecified kidney, except renal pelvis: Secondary | ICD-10-CM

## 2017-08-17 DIAGNOSIS — I482 Chronic atrial fibrillation, unspecified: Secondary | ICD-10-CM

## 2017-08-20 DIAGNOSIS — M1712 Unilateral primary osteoarthritis, left knee: Secondary | ICD-10-CM | POA: Diagnosis not present

## 2017-08-22 ENCOUNTER — Other Ambulatory Visit: Payer: Self-pay | Admitting: *Deleted

## 2017-08-22 DIAGNOSIS — C800 Disseminated malignant neoplasm, unspecified: Secondary | ICD-10-CM

## 2017-08-23 ENCOUNTER — Other Ambulatory Visit: Payer: Self-pay | Admitting: *Deleted

## 2017-08-23 ENCOUNTER — Ambulatory Visit (HOSPITAL_BASED_OUTPATIENT_CLINIC_OR_DEPARTMENT_OTHER): Payer: Medicare Other | Admitting: Hematology & Oncology

## 2017-08-23 ENCOUNTER — Ambulatory Visit (HOSPITAL_BASED_OUTPATIENT_CLINIC_OR_DEPARTMENT_OTHER): Payer: Medicare Other

## 2017-08-23 ENCOUNTER — Ambulatory Visit: Payer: Medicare Other

## 2017-08-23 ENCOUNTER — Other Ambulatory Visit (HOSPITAL_BASED_OUTPATIENT_CLINIC_OR_DEPARTMENT_OTHER): Payer: Medicare Other

## 2017-08-23 VITALS — BP 137/57 | HR 69 | Temp 98.0°F | Resp 18 | Wt 162.0 lb

## 2017-08-23 DIAGNOSIS — N390 Urinary tract infection, site not specified: Secondary | ICD-10-CM | POA: Diagnosis not present

## 2017-08-23 DIAGNOSIS — E8809 Other disorders of plasma-protein metabolism, not elsewhere classified: Secondary | ICD-10-CM

## 2017-08-23 DIAGNOSIS — R531 Weakness: Secondary | ICD-10-CM | POA: Diagnosis not present

## 2017-08-23 DIAGNOSIS — C800 Disseminated malignant neoplasm, unspecified: Secondary | ICD-10-CM

## 2017-08-23 DIAGNOSIS — C799 Secondary malignant neoplasm of unspecified site: Secondary | ICD-10-CM

## 2017-08-23 DIAGNOSIS — D508 Other iron deficiency anemias: Secondary | ICD-10-CM

## 2017-08-23 DIAGNOSIS — C641 Malignant neoplasm of right kidney, except renal pelvis: Secondary | ICD-10-CM

## 2017-08-23 DIAGNOSIS — C7951 Secondary malignant neoplasm of bone: Secondary | ICD-10-CM

## 2017-08-23 DIAGNOSIS — D509 Iron deficiency anemia, unspecified: Secondary | ICD-10-CM

## 2017-08-23 DIAGNOSIS — R82998 Other abnormal findings in urine: Secondary | ICD-10-CM

## 2017-08-23 DIAGNOSIS — C7800 Secondary malignant neoplasm of unspecified lung: Secondary | ICD-10-CM

## 2017-08-23 DIAGNOSIS — C7802 Secondary malignant neoplasm of left lung: Secondary | ICD-10-CM

## 2017-08-23 DIAGNOSIS — C649 Malignant neoplasm of unspecified kidney, except renal pelvis: Secondary | ICD-10-CM

## 2017-08-23 DIAGNOSIS — R5383 Other fatigue: Secondary | ICD-10-CM | POA: Diagnosis not present

## 2017-08-23 DIAGNOSIS — R300 Dysuria: Secondary | ICD-10-CM

## 2017-08-23 LAB — CMP (CANCER CENTER ONLY)
ALBUMIN: 2.6 g/dL — AB (ref 3.3–5.5)
ALT(SGPT): 35 U/L (ref 10–47)
AST: 24 U/L (ref 11–38)
Alkaline Phosphatase: 179 U/L — ABNORMAL HIGH (ref 26–84)
BILIRUBIN TOTAL: 0.6 mg/dL (ref 0.20–1.60)
BUN, Bld: 17 mg/dL (ref 7–22)
CALCIUM: 11 mg/dL — AB (ref 8.0–10.3)
CHLORIDE: 97 meq/L — AB (ref 98–108)
CO2: 27 meq/L (ref 18–33)
Creat: 1.1 mg/dl (ref 0.6–1.2)
GLUCOSE: 151 mg/dL — AB (ref 73–118)
Potassium: 3.9 mEq/L (ref 3.3–4.7)
Sodium: 131 mEq/L (ref 128–145)
Total Protein: 6.7 g/dL (ref 6.4–8.1)

## 2017-08-23 LAB — URINALYSIS, MICROSCOPIC (CHCC SATELLITE)
BILIRUBIN (URINE): NEGATIVE
BLOOD: NEGATIVE
Glucose: NEGATIVE mg/dL
Ketones: NEGATIVE mg/dL
NITRITE: NEGATIVE
Protein: NEGATIVE mg/dL
Specific Gravity, Urine: 1.015 (ref 1.003–1.035)
UROBILINOGEN UR: 0.2 mg/dL (ref 0.2–1)
pH: 5 (ref 4.60–8.00)

## 2017-08-23 LAB — CBC WITH DIFFERENTIAL (CANCER CENTER ONLY)
BASO#: 0 10*3/uL (ref 0.0–0.2)
BASO%: 0.1 % (ref 0.0–2.0)
EOS%: 0.4 % (ref 0.0–7.0)
Eosinophils Absolute: 0 10*3/uL (ref 0.0–0.5)
HCT: 30.8 % — ABNORMAL LOW (ref 34.8–46.6)
HEMOGLOBIN: 9.4 g/dL — AB (ref 11.6–15.9)
LYMPH#: 0.6 10*3/uL — ABNORMAL LOW (ref 0.9–3.3)
LYMPH%: 9.1 % — ABNORMAL LOW (ref 14.0–48.0)
MCH: 24.5 pg — ABNORMAL LOW (ref 26.0–34.0)
MCHC: 30.5 g/dL — AB (ref 32.0–36.0)
MCV: 80 fL — ABNORMAL LOW (ref 81–101)
MONO#: 0.6 10*3/uL (ref 0.1–0.9)
MONO%: 8.4 % (ref 0.0–13.0)
NEUT%: 82 % — ABNORMAL HIGH (ref 39.6–80.0)
NEUTROS ABS: 5.7 10*3/uL (ref 1.5–6.5)
Platelets: 329 10*3/uL (ref 145–400)
RBC: 3.83 10*6/uL (ref 3.70–5.32)
RDW: 16.9 % — AB (ref 11.1–15.7)
WBC: 6.9 10*3/uL (ref 3.9–10.0)

## 2017-08-23 LAB — LACTATE DEHYDROGENASE: LDH: 196 U/L (ref 125–245)

## 2017-08-23 MED ORDER — NITROFURANTOIN MONOHYD MACRO 100 MG PO CAPS: 100.0000 mg | ORAL_CAPSULE | Freq: Two times a day (BID) | ORAL | 0 refills | Status: AC

## 2017-08-23 MED ORDER — DENOSUMAB 120 MG/1.7ML ~~LOC~~ SOLN
SUBCUTANEOUS | Status: AC
  Filled 2017-08-23: qty 1.7

## 2017-08-23 MED ORDER — SODIUM CHLORIDE 0.9 % IV SOLN
INTRAVENOUS | Status: DC
  Administered 2017-08-23: 14:00:00 via INTRAVENOUS

## 2017-08-23 MED ORDER — SODIUM CHLORIDE 0.9 % IV SOLN
510.0000 mg | Freq: Once | INTRAVENOUS | Status: AC
  Administered 2017-08-23: 510 mg via INTRAVENOUS
  Filled 2017-08-23: qty 17

## 2017-08-23 MED ORDER — SODIUM CHLORIDE 0.9 % IV SOLN: Freq: Once | INTRAVENOUS | Status: DC

## 2017-08-23 MED ORDER — SODIUM CHLORIDE 0.9% FLUSH
10.0000 mL | INTRAVENOUS | Status: DC | PRN
  Administered 2017-08-23: 10 mL via INTRAVENOUS
  Filled 2017-08-23: qty 10

## 2017-08-23 MED ORDER — DENOSUMAB 120 MG/1.7ML ~~LOC~~ SOLN
120.0000 mg | Freq: Once | SUBCUTANEOUS | Status: AC
  Administered 2017-08-23: 120 mg via SUBCUTANEOUS

## 2017-08-23 MED ORDER — LEVOFLOXACIN IN D5W 500 MG/100ML IV SOLN
500.0000 mg | INTRAVENOUS | Status: DC
  Administered 2017-08-23: 500 mg via INTRAVENOUS
  Filled 2017-08-23: qty 100

## 2017-08-23 MED ORDER — HEPARIN SOD (PORK) LOCK FLUSH 100 UNIT/ML IV SOLN
500.0000 [IU] | Freq: Once | INTRAVENOUS | Status: AC
  Administered 2017-08-23: 500 [IU] via INTRAVENOUS
  Filled 2017-08-23: qty 5

## 2017-08-23 MED ORDER — NITROFURANTOIN MONOHYD MACRO 100 MG PO CAPS: 100.0000 mg | ORAL_CAPSULE | Freq: Two times a day (BID) | ORAL | 0 refills | Status: DC

## 2017-08-23 NOTE — Patient Instructions (Signed)
Implanted Port Home Guide An implanted port is a type of central line that is placed under the skin. Central lines are used to provide IV access when treatment or nutrition needs to be given through a person's veins. Implanted ports are used for long-term IV access. An implanted port may be placed because:  You need IV medicine that would be irritating to the small veins in your hands or arms.  You need long-term IV medicines, such as antibiotics.  You need IV nutrition for a long period.  You need frequent blood draws for lab tests.  You need dialysis.  Implanted ports are usually placed in the chest area, but they can also be placed in the upper arm, the abdomen, or the leg. An implanted port has two main parts:  Reservoir. The reservoir is round and will appear as a small, raised area under your skin. The reservoir is the part where a needle is inserted to give medicines or draw blood.  Catheter. The catheter is a thin, flexible tube that extends from the reservoir. The catheter is placed into a large vein. Medicine that is inserted into the reservoir goes into the catheter and then into the vein.  How will I care for my incision site? Do not get the incision site wet. Bathe or shower as directed by your health care provider. How is my port accessed? Special steps must be taken to access the port:  Before the port is accessed, a numbing cream can be placed on the skin. This helps numb the skin over the port site.  Your health care provider uses a sterile technique to access the port. ? Your health care provider must put on a mask and sterile gloves. ? The skin over your port is cleaned carefully with an antiseptic and allowed to dry. ? The port is gently pinched between sterile gloves, and a needle is inserted into the port.  Only "non-coring" port needles should be used to access the port. Once the port is accessed, a blood return should be checked. This helps ensure that the port  is in the vein and is not clogged.  If your port needs to remain accessed for a constant infusion, a clear (transparent) bandage will be placed over the needle site. The bandage and needle will need to be changed every week, or as directed by your health care provider.  Keep the bandage covering the needle clean and dry. Do not get it wet. Follow your health care provider's instructions on how to take a shower or bath while the port is accessed.  If your port does not need to stay accessed, no bandage is needed over the port.  What is flushing? Flushing helps keep the port from getting clogged. Follow your health care provider's instructions on how and when to flush the port. Ports are usually flushed with saline solution or a medicine called heparin. The need for flushing will depend on how the port is used.  If the port is used for intermittent medicines or blood draws, the port will need to be flushed: ? After medicines have been given. ? After blood has been drawn. ? As part of routine maintenance.  If a constant infusion is running, the port may not need to be flushed.  How long will my port stay implanted? The port can stay in for as long as your health care provider thinks it is needed. When it is time for the port to come out, surgery will be   done to remove it. The procedure is similar to the one performed when the port was put in. When should I seek immediate medical care? When you have an implanted port, you should seek immediate medical care if:  You notice a bad smell coming from the incision site.  You have swelling, redness, or drainage at the incision site.  You have more swelling or pain at the port site or the surrounding area.  You have a fever that is not controlled with medicine.  This information is not intended to replace advice given to you by your health care provider. Make sure you discuss any questions you have with your health care provider. Document  Released: 11/05/2005 Document Revised: 04/12/2016 Document Reviewed: 07/13/2013 Elsevier Interactive Patient Education  2017 [employer]  

## 2017-08-23 NOTE — Progress Notes (Signed)
Hematology and Oncology Follow Up Visit  Brooke Rios [id number] 14-Nov-1925 83 y.o. 08/23/2017   Principle Diagnosis:   Metastatic  RIGHT renal cell carcinoma-progressive  Paroxysmal atrial fibrillation  Hypercalcemia of malignancy  Current Therapy:    Votrient 400 mg by mouth daily  Nivolumab 233m IV q 2 week - start 10/30/2016 - s/p c#3  Status post stereotactic radiosurgery for solitary brain met  ELIQUIS 2.5 mg by mouth daily  Xgeva 120 mg subcutaneous every month  Palliative radiation to the left femur - 1 dose of 8 Gy     Interim History:  Brooke Rios back for follow-up. She is still declining. Her weight is down. She is not eating as much. She sleeps quite a bit.  I'm really not surprised by her decline. I know that she is doing her best. According to her daughters, she mostly sleeps in a recliner at home.  She is not eating. Her albumin continues to decline.  She is iron deficient. I suspect this also is contributing to her fatigue and weakness. We will give her a dose of iron today.   She is not having any pain. I am thankful for this. She has not fallen.  There is no headache.  She's had no nausea or vomiting.   There's been no leg swelling. She's had no rashes.   Overall, her performance status is ECOG 3.     Medications:  Current Outpatient Prescriptions:  .  acetaminophen (TYLENOL) 500 MG tablet, Take 500 mg by mouth every 6 (six) hours as needed (for pain.). , Disp: , Rfl:  .  [facility] MELATONIN 5 MG TABS, Take 5 mg by mouth at bedtime as needed (for sleep.). , Disp: , Rfl: 2 .  ELIQUIS 2.5 MG TABS tablet, TAKE 1 TABLET (2.5 MG TOTAL) BY MOUTH 2 (TWO) TIMES DAILY. (Patient taking differently: Take 2.5 mg by mouth daily. ), Disp: 180 tablet, Rfl: 2 .  hydrocortisone (CORTEF) 10 MG tablet, TAKE 1 TABLET BY MOUTH TWICE A DAY, Disp: 60 tablet, Rfl: 1 .  lactulose (CHRONULAC) 10 GM/15ML solution, Take 15 mLs (10 g total) by mouth every 4 (four) hours.  Take until BM, Disp: 473 mL, Rfl: 0 .  levofloxacin (LEVAQUIN) 250 MG tablet, Take 1 tablet (250 mg total) by mouth daily., Disp: 5 tablet, Rfl: 1 .  lidocaine-prilocaine (EMLA) cream, Apply 1 application topically daily as needed (prior to port begin access)., Disp: , Rfl:  .  metoCLOPramide (REGLAN) 5 MG tablet, Take 1 tablet (5 mg total) by mouth 3 (three) times daily before meals. (Patient taking differently: Take 5 mg by mouth 3 (three) times daily before meals. ), Disp: 90 tablet, Rfl: 3 .  metoprolol succinate (TOPROL-XL) 50 MG 24 hr tablet, Take 1 tablet (50 mg total) by mouth every morning. Take with or immediately following a meal., Disp: 90 tablet, Rfl: 3 .  nitroGLYCERIN (NITROSTAT) 0.4 MG SL tablet, Place 1 tablet (0.4 mg total) under the tongue every 5 (five) minutes as needed for chest pain (MAX 3 TABLETS)., Disp: 25 tablet, Rfl: 1 .  pantoprazole (PROTONIX) 40 MG tablet, TAKE 1 TABLET (40 MG TOTAL) BY MOUTH DAILY., Disp: 30 tablet, Rfl: 2 .  polyethylene glycol (MIRALAX / GLYCOLAX) packet, Take 17 g by mouth daily as needed (constipation). Mix in 8 oz liquid and drink , Disp: , Rfl:  .  polyvinyl alcohol (LIQUIFILM TEARS) 1.4 % ophthalmic solution, Place 1 drop into both eyes every hour as needed for  dry eyes., Disp: 15 mL, Rfl: 0 .  sulfamethoxazole-trimethoprim (BACTRIM) 400-80 MG tablet, Take 1 tablet by mouth 2 (two) times daily., Disp: 10 tablet, Rfl: 0 .  temazepam (RESTORIL) 15 MG capsule, TAKE 1 CAPSULE BY MOUTH AT BEDTIME AS NEEDED FOR INSOMNIA MAY REPEAT X 1 DOSE, Disp: , Rfl: 2 .  vitamin E 400 UNIT capsule, Take 400 Units by mouth as directed. Take 1 capsule daily for one week then start taking 1 capsule twice daily, Disp: , Rfl:  No current facility-administered medications for this visit.   Facility-Administered Medications Ordered in Other Visits:  .  sodium chloride flush (NS) 0.9 % injection 10 mL, 10 mL, Intravenous, PRN, [place], Holli Humbles, NP, 10 mL at 12/26/16  1447  Allergies:  Allergies  Allergen Reactions  . Cefpodoxime Other (See Comments)    Nocturia & disturbed sleep  . Codeine Nausea Only    Past Medical History, Surgical history, Social history, and Family History were reviewed and updated as in the interim history.  Review of Systems: As stated in the interim history  Physical Exam:  weight is 162 lb (73.5 kg). Her oral temperature is 98 F (36.7 C). Her blood pressure is 137/57 (abnormal) and her pulse is 69. Her respiration is 18 and oxygen saturation is 98%.   Wt Readings from Last 3 Encounters:  08/23/17 162 lb (73.5 kg)  08/07/17 165 lb (74.8 kg)  07/10/17 177 lb (80.3 kg)     I examined Brooke Rios. The results of my examination are noted below with appropriate changes indicated:   Elderly white female in no obvious distress. Head and neck exam shows no ocular or oral lesions. Her oral mucosa is dry. There is no scleral icterus. There is no adenopathy in the neck area and thyroid is non-palpable. Lungs are clear bilaterally. Cardiac exam regular rate and rhythm with a 1/6 systolic ejection murmur. Abdomen is soft issues good bowel sounds. There is no fluid wave. There is no palpable liver or spleen tip. Back exam shows no tenderness over the spine, ribs or hips. Extremities shows no clubbing, cyanosis or edema. She does have some decreased strength in her arms and legs bilaterally. Skin exam shows some scattered ecchymoses. Neurological exam shows no obvious neurological deficits.  Lab Results  Component Value Date   WBC 6.9 08/23/2017   HGB 9.4 (L) 08/23/2017   HCT 30.8 (L) 08/23/2017   MCV 80 (L) 08/23/2017   PLT 329 08/23/2017     Chemistry      Component Value Date/Time   NA 131 08/23/2017 1209   NA 137 08/08/2016 0948   K 3.9 08/23/2017 1209   K 4.2 08/08/2016 0948   CL 97 (L) 08/23/2017 1209   CO2 27 08/23/2017 1209   CO2 23 08/08/2016 0948   BUN 17 08/23/2017 1209   BUN 40.4 (H) 08/08/2016 0948    CREATININE 1.1 08/23/2017 1209   CREATININE 1.4 (H) 08/08/2016 0948      Component Value Date/Time   CALCIUM 11.0 (H) 08/23/2017 1209   CALCIUM 9.6 08/08/2016 0948   ALKPHOS 179 (H) 08/23/2017 1209   ALKPHOS 99 08/08/2016 0948   AST 24 08/23/2017 1209   AST 11 08/08/2016 0948   ALT 35 08/23/2017 1209   ALT 26 08/08/2016 0948   BILITOT 0.60 08/23/2017 1209   BILITOT 0.52 08/08/2016 0948         Impression and Plan: Brooke Rios is  an 83 year old white female  with metastatic clear  cell carcinoma of the kidney. She is being followed at home by hospice. We are giving her Delton See because of hypercalcemia. Her labs today still show some element of hypercalcemia. I want to control this so that she has a good quality of life.  She definitely is dehydrated. We will give her some IV fluid.  She does have decreased iron. Her ferritin was markedly elevated because of her malignancy but her iron saturation is only 11%.  Her albumin continues to decline. I think this is a very accurate indicator for her prognosis.  Again, her urine looks like it is infected. I'm having the urine sent off for culture.  I will like to see her back in 2 weeks. Hopefully, we can get her to the Thanksgiving holiday. I think this is going to be close given her decline.  I spent about 35 minutes with she and her 2 daughters today. Her daughters clearly are aware of her poor prognosis. They want her to have a decent quality of life with no pain and with respect and dignity. I know that Hospice will help out with this.    Volanda Napoleon, MD 10/5/20181:32 PM

## 2017-08-23 NOTE — Patient Instructions (Signed)
Dehydration, Adult Dehydration is when there is not enough fluid or water in your body. This happens when you lose more fluids than you take in. Dehydration can range from mild to very bad. It should be treated right away to keep it from getting very bad. Symptoms of mild dehydration may include:  Thirst.  Dry lips.  Slightly dry mouth.  Dry, warm skin.  Dizziness. Symptoms of moderate dehydration may include:  Very dry mouth.  Muscle cramps.  Dark pee (urine). Pee may be the color of tea.  Your body making less pee.  Your eyes making fewer tears.  Heartbeat that is uneven or faster than normal (palpitations).  Headache.  Light-headedness, especially when you stand up from sitting.  Fainting (syncope). Symptoms of very bad dehydration may include:  Changes in skin, such as: ? Cold and clammy skin. ? Blotchy (mottled) or pale skin. ? Skin that does not quickly return to normal after being lightly pinched and let go (poor skin turgor).  Changes in body fluids, such as: ? Feeling very thirsty. ? Your eyes making fewer tears. ? Not sweating when body temperature is high, such as in hot weather. ? Your body making very little pee.  Changes in vital signs, such as: ? Weak pulse. ? Pulse that is more than 100 beats a minute when you are sitting still. ? Fast breathing. ? Low blood pressure.  Other changes, such as: ? Sunken eyes. ? Cold hands and feet. ? Confusion. ? Lack of energy (lethargy). ? Trouble waking up from sleep. ? Short-term weight loss. ? Unconsciousness. Follow these instructions at home:  If told by your doctor, drink an ORS: ? Make an ORS by using instructions on the package. ? Start by drinking small amounts, about  cup (120 mL) every 5-10 minutes. ? Slowly drink more until you have had the amount that your doctor said to have.  Drink enough clear fluid to keep your pee clear or pale yellow. If you were told to drink an ORS, finish the ORS  first, then start slowly drinking clear fluids. Drink fluids such as: ? Water. Do not drink only water by itself. Doing that can make the salt (sodium) level in your body get too low (hyponatremia). ? Ice chips. ? Fruit juice that you have added water to (diluted). ? Low-calorie sports drinks.  Avoid: ? Alcohol. ? Drinks that have a lot of sugar. These include high-calorie sports drinks, fruit juice that does not have water added, and soda. ? Caffeine. ? Foods that are greasy or have a lot of fat or sugar.  Take over-the-counter and prescription medicines only as told by your doctor.  Do not take salt tablets. Doing that can make the salt level in your body get too high (hypernatremia).  Eat foods that have minerals (electrolytes). Examples include bananas, oranges, potatoes, tomatoes, and spinach.  Keep all follow-up visits as told by your doctor. This is important. Contact a doctor if:  You have belly (abdominal) pain that: ? Gets worse. ? Stays in one area (localizes).  You have a rash.  You have a stiff neck.  You get angry or annoyed more easily than normal (irritability).  You are more sleepy than normal.  You have a harder time waking up than normal.  You feel: ? Weak. ? Dizzy. ? Very thirsty.  You have peed (urinated) only a small amount of very dark pee during 6-8 hours. Get help right away if:  You have symptoms of   very bad dehydration.  You cannot drink fluids without throwing up (vomiting).  Your symptoms get worse with treatment.  You have a fever.  You have a very bad headache.  You are throwing up or having watery poop (diarrhea) and it: ? Gets worse. ? Does not go away.  You have blood or something green (bile) in your throw-up.  You have blood in your poop (stool). This may cause poop to look black and tarry.  You have not peed in 6-8 hours.  You pass out (faint).  Your heart rate when you are sitting still is more than 100 beats a  minute.  You have trouble breathing. This information is not intended to replace advice given to you by your health care provider. Make sure you discuss any questions you have with your health care provider. Document Released: 09/01/2009 Document Revised: 05/25/2016 Document Reviewed: 12/30/2015 Elsevier Interactive Patient Education  2018 [employer]. Levofloxacin injection What is this medicine? LEVOFLOXACIN (lee voe FLOX a sin) is a quinolone antibiotic. It is used to treat certain kinds of bacterial infections. It will not work for colds, flu, or other viral infections. This medicine may be used for other purposes; ask your health care provider or pharmacist if you have questions. COMMON BRAND NAME(S): Levaquin What should I tell my health care provider before I take this medicine? They need to know if you have any of these conditions: -bone problems -diabetes -history of low levels of potassium in the blood -irregular heartbeat -joint problems -kidney disease -liver disease -myasthenia gravis -seizures -tendon problems -tingling of the fingers or toes, or other nerve disorder -an unusual or allergic reaction to levofloxacin, other quinolone antibiotics, foods, dyes, or preservatives -pregnant or trying to get pregnant -breast-feeding How should I use this medicine? This medicine is for infusion into a vein. It is usually given by a health care professional in a hospital or clinic setting. If you get this medicine at home, you will be taught how to prepare and give this medicine. Use exactly as directed. Take your medicine at regular intervals. Do not take your medicine more often than directed. It is important that you put your used needles and syringes in a special sharps container. Do not put them in a trash can. If you do not have a sharps container, call your pharmacist or healthcare provider to get one. A special MedGuide will be given to you by the pharmacist with each  prescription and refill. Be sure to read this information carefully each time. Talk to your pediatrician regarding the use of this medicine in children. While this drug may be prescribed for children as young as 6 months for selected conditions, precautions do apply. Overdosage: If you think you have taken too much of this medicine contact a poison control center or emergency room at once. NOTE: This medicine is only for you. Do not share this medicine with others. What if I miss a dose? If you miss a dose, use it as soon as you can. If it is almost time for your next dose, use only that dose. Do not use double or extra doses. What may interact with this medicine? Do not take this medicine with any of the following medications: -bepridil -certain medicines for depression, anxiety, or psychotic disturbances like pimozide, thioridazine, and ziprasidone -certain medicines for irregular heart beat like dofetilide and dronedarone -cisapride -halofantrine This medicine may also interact with the following medications: -birth control pills -certain medicines for diabetes, like glipizide, glyburide,  or insulin -NSAIDS, medicines for pain and inflammation, like ibuprofen or naproxen -steroid medicines like prednisone or cortisone -theophylline -warfarin This list may not describe all possible interactions. Give your health care provider a list of all the medicines, herbs, non-prescription drugs, or dietary supplements you use. Also tell them if you smoke, drink alcohol, or use illegal drugs. Some items may interact with your medicine. What should I watch for while using this medicine? Tell your doctor or healthcare professional if your symptoms do not start to get better or if they get worse. Do not treat diarrhea with over the counter products. Contact your doctor if you have diarrhea that lasts more than 2 days or if it is severe and watery. Check with your doctor or health care professional if you  get an attack of severe diarrhea, nausea and vomiting, or if you sweat a lot. The loss of too much body fluid can make it dangerous for you to take this medicine. You may get drowsy or dizzy. Do not drive, use machinery, or do anything that needs mental alertness until you know how this medicine affects you. Do not sit or stand up quickly, especially if you are an older patient. This reduces the risk of dizzy or fainting spells. This medicine can make you more sensitive to the sun. Keep out of the sun. If you cannot avoid being in the sun, wear protective clothing and use a sunscreen. Do not use sun lamps or tanning beds/booths. Contact your doctor if you get a sunburn. If you are a diabetic monitor your blood glucose carefully. If you get an unusual reading stop taking this medicine and call your doctor right away. What side effects may I notice from receiving this medicine? Side effects that you should report to your doctor or health care professional as soon as possible: -allergic reactions like skin rash or hives, swelling of the face, lips, or tongue -anxious -breathing problems -confusion -depressed mood -diarrhea -dizziness -fast, irregular heartbeat -hallucination, loss of contact with reality -joint, muscle, or tendon pain or swelling -muscle weakness -pain, tingling, numbness in the hands or feet -seizures -signs and symptoms of high blood sugar such as dizziness; dry mouth; dry skin; fruity breath; nausea; stomach pain; increased hunger or thirst; increased urination -signs and symptoms of liver injury like dark yellow or [place] urine; general ill feeling or flu-like symptoms; light-colored stools; loss of appetite; nausea; right upper belly pain; unusually weak or tired; yellowing of the eyes or skin -signs and symptoms of low blood sugar such as feeling anxious; confusion; dizziness; increased hunger; unusually weak or tired; sweating; shakiness; cold; irritable; headache; blurred  vision; fast heartbeat; loss of consciousness -suicidal thoughts or other mood changes -sunburn -unusually weak or tired Side effects that usually do not require medical attention (report to your doctor or health care professional if they continue or are bothersome): -constipation -dry mouth -headache -nausea, vomiting -pain, irritation at the site of injection -trouble sleeping This list may not describe all possible side effects. Call your doctor for medical advice about side effects. You may report side effects to FDA at 1-800-FDA-1088. Where should I keep my medicine? Keep out of the reach of children. If you are using this medicine at home, you will be instructed on how to store this medicine. Throw away any unused medicine after the expiration date on the label. NOTE: This sheet is a summary. It may not cover all possible information. If you have questions about this medicine, talk to  your doctor, pharmacist, or health care provider.  2018 Elsevier/Gold Standard (2016-05-15 12:36:58) Denosumab injection What is this medicine? DENOSUMAB (den oh sue mab) slows bone breakdown. Prolia is used to treat osteoporosis in women after menopause and in men. Delton See is used to treat a high calcium level due to cancer and to prevent bone fractures and other bone problems caused by multiple myeloma or cancer bone metastases. Delton See is also used to treat giant cell tumor of the bone. This medicine may be used for other purposes; ask your health care provider or pharmacist if you have questions. COMMON BRAND NAME(S): Prolia, XGEVA What should I tell my health care provider before I take this medicine? They need to know if you have any of these conditions: -dental disease -having surgery or tooth extraction -infection -kidney disease -low levels of calcium or Vitamin D in the blood -malnutrition -on hemodialysis -skin conditions or sensitivity -thyroid or parathyroid disease -an unusual reaction  to denosumab, other medicines, foods, dyes, or preservatives -pregnant or trying to get pregnant -breast-feeding How should I use this medicine? This medicine is for injection under the skin. It is given by a health care professional in a hospital or clinic setting. If you are getting Prolia, a special MedGuide will be given to you by the pharmacist with each prescription and refill. Be sure to read this information carefully each time. For Prolia, talk to your pediatrician regarding the use of this medicine in children. Special care may be needed. For Delton See, talk to your pediatrician regarding the use of this medicine in children. While this drug may be prescribed for children as young as 13 years for selected conditions, precautions do apply. Overdosage: If you think you have taken too much of this medicine contact a poison control center or emergency room at once. NOTE: This medicine is only for you. Do not share this medicine with others. What if I miss a dose? It is important not to miss your dose. Call your doctor or health care professional if you are unable to keep an appointment. What may interact with this medicine? Do not take this medicine with any of the following medications: -other medicines containing denosumab This medicine may also interact with the following medications: -medicines that lower your chance of fighting infection -steroid medicines like prednisone or cortisone This list may not describe all possible interactions. Give your health care provider a list of all the medicines, herbs, non-prescription drugs, or dietary supplements you use. Also tell them if you smoke, drink alcohol, or use illegal drugs. Some items may interact with your medicine. What should I watch for while using this medicine? Visit your doctor or health care professional for regular checks on your progress. Your doctor or health care professional may order blood tests and other tests to see how you  are doing. Call your doctor or health care professional for advice if you get a fever, chills or sore throat, or other symptoms of a cold or flu. Do not treat yourself. This drug may decrease your body's ability to fight infection. Try to avoid being around people who are sick. You should make sure you get enough calcium and vitamin D while you are taking this medicine, unless your doctor tells you not to. Discuss the foods you eat and the vitamins you take with your health care professional. See your dentist regularly. Brush and floss your teeth as directed. Before you have any dental work done, tell your dentist you are receiving this medicine.  Do not become pregnant while taking this medicine or for 5 months after stopping it. Talk with your doctor or health care professional about your birth control options while taking this medicine. Women should inform their doctor if they wish to become pregnant or think they might be pregnant. There is a potential for serious side effects to an unborn child. Talk to your health care professional or pharmacist for more information. What side effects may I notice from receiving this medicine? Side effects that you should report to your doctor or health care professional as soon as possible: -allergic reactions like skin rash, itching or hives, swelling of the face, lips, or tongue -bone pain -breathing problems -dizziness -jaw pain, especially after dental work -redness, blistering, peeling of the skin -signs and symptoms of infection like fever or chills; cough; sore throat; pain or trouble passing urine -signs of low calcium like fast heartbeat, muscle cramps or muscle pain; pain, tingling, numbness in the hands or feet; seizures -unusual bleeding or bruising -unusually weak or tired Side effects that usually do not require medical attention (report to your doctor or health care professional if they continue or are  bothersome): -constipation -diarrhea -headache -joint pain -loss of appetite -muscle pain -runny nose -tiredness -upset stomach This list may not describe all possible side effects. Call your doctor for medical advice about side effects. You may report side effects to FDA at 1-800-FDA-1088. Where should I keep my medicine? This medicine is only given in a clinic, doctor's office, or other health care setting and will not be stored at home. NOTE: This sheet is a summary. It may not cover all possible information. If you have questions about this medicine, talk to your doctor, pharmacist, or health care provider.  2018 Elsevier/Gold Standard (2016-11-27 19:17:21) Ferumoxytol injection What is this medicine? FERUMOXYTOL is an iron complex. Iron is used to make healthy red blood cells, which carry oxygen and nutrients throughout the body. This medicine is used to treat iron deficiency anemia in people with chronic kidney disease. This medicine may be used for other purposes; ask your health care provider or pharmacist if you have questions. COMMON BRAND NAME(S): Feraheme What should I tell my health care provider before I take this medicine? They need to know if you have any of these conditions: -anemia not caused by low iron levels -high levels of iron in the blood -magnetic resonance imaging (MRI) test scheduled -an unusual or allergic reaction to iron, other medicines, foods, dyes, or preservatives -pregnant or trying to get pregnant -breast-feeding How should I use this medicine? This medicine is for injection into a vein. It is given by a health care professional in a hospital or clinic setting. Talk to your pediatrician regarding the use of this medicine in children. Special care may be needed. Overdosage: If you think you have taken too much of this medicine contact a poison control center or emergency room at once. NOTE: This medicine is only for you. Do not share this medicine  with others. What if I miss a dose? It is important not to miss your dose. Call your doctor or health care professional if you are unable to keep an appointment. What may interact with this medicine? This medicine may interact with the following medications: -other iron products This list may not describe all possible interactions. Give your health care provider a list of all the medicines, herbs, non-prescription drugs, or dietary supplements you use. Also tell them if you smoke, drink alcohol, or  use illegal drugs. Some items may interact with your medicine. What should I watch for while using this medicine? Visit your doctor or healthcare professional regularly. Tell your doctor or healthcare professional if your symptoms do not start to get better or if they get worse. You may need blood work done while you are taking this medicine. You may need to follow a special diet. Talk to your doctor. Foods that contain iron include: whole grains/cereals, dried fruits, beans, or peas, leafy green vegetables, and organ meats (liver, kidney). What side effects may I notice from receiving this medicine? Side effects that you should report to your doctor or health care professional as soon as possible: -allergic reactions like skin rash, itching or hives, swelling of the face, lips, or tongue -breathing problems -changes in blood pressure -feeling faint or lightheaded, falls -fever or chills -flushing, sweating, or hot feelings -swelling of the ankles or feet Side effects that usually do not require medical attention (report to your doctor or health care professional if they continue or are bothersome): -diarrhea -headache -nausea, vomiting -stomach pain This list may not describe all possible side effects. Call your doctor for medical advice about side effects. You may report side effects to FDA at 1-800-FDA-1088. Where should I keep my medicine? This drug is given in a hospital or clinic and will  not be stored at home. NOTE: This sheet is a summary. It may not cover all possible information. If you have questions about this medicine, talk to your doctor, pharmacist, or health care provider.  2018 Elsevier/Gold Standard (2015-12-08 12:41:49)

## 2017-08-23 NOTE — Addendum Note (Signed)
Addended by: Burney Gauze R on: 08/23/2017 04:09 PM   Modules accepted: Orders

## 2017-08-24 LAB — URINE CULTURE

## 2017-08-26 ENCOUNTER — Encounter: Payer: Self-pay | Admitting: *Deleted

## 2017-08-26 LAB — IRON AND TIBC
%SAT: 12 % — ABNORMAL LOW (ref 21–57)
Iron: 24 ug/dL — ABNORMAL LOW (ref 41–142)
TIBC: 198 ug/dL — ABNORMAL LOW (ref 236–444)
UIBC: 174 ug/dL (ref 120–384)

## 2017-08-26 LAB — FERRITIN: Ferritin: 2662 ng/ml — ABNORMAL HIGH (ref 9–269)

## 2017-08-27 DIAGNOSIS — M1712 Unilateral primary osteoarthritis, left knee: Secondary | ICD-10-CM | POA: Diagnosis not present

## 2017-09-03 ENCOUNTER — Ambulatory Visit (INDEPENDENT_AMBULATORY_CARE_PROVIDER_SITE_OTHER): Payer: Medicare Other | Admitting: *Deleted

## 2017-09-03 ENCOUNTER — Encounter: Payer: Self-pay | Admitting: Hematology & Oncology

## 2017-09-03 ENCOUNTER — Telehealth: Payer: Self-pay | Admitting: Cardiology

## 2017-09-03 DIAGNOSIS — I495 Sick sinus syndrome: Secondary | ICD-10-CM | POA: Diagnosis not present

## 2017-09-03 NOTE — Telephone Encounter (Signed)
LMOVM reminding pt to send remote transmission.   

## 2017-09-04 NOTE — Progress Notes (Signed)
Remote pacemaker transmission.   

## 2017-09-05 ENCOUNTER — Ambulatory Visit: Payer: Medicare Other | Admitting: Family

## 2017-09-05 ENCOUNTER — Ambulatory Visit: Payer: Medicare Other

## 2017-09-05 ENCOUNTER — Other Ambulatory Visit: Payer: Medicare Other

## 2017-09-05 LAB — CUP PACEART REMOTE DEVICE CHECK
Battery Remaining Longevity: 83 mo
Brady Statistic AP VP Percent: 1 %
Brady Statistic AP VS Percent: 55 %
Brady Statistic AS VP Percent: 0 %
Brady Statistic AS VS Percent: 44 %
Implantable Lead Implant Date: 20110608
Implantable Lead Implant Date: 20110608
Implantable Lead Model: 4470
Implantable Lead Model: 4471
Implantable Lead Serial Number: 480470
Lead Channel Impedance Value: 481 Ohm
Lead Channel Pacing Threshold Amplitude: 0.625 V
Lead Channel Pacing Threshold Pulse Width: 0.4 ms
Lead Channel Setting Pacing Amplitude: 2 V
Lead Channel Setting Sensing Sensitivity: 2 mV
MDC IDC LEAD LOCATION: 753859
MDC IDC LEAD LOCATION: 753860
MDC IDC LEAD SERIAL: 672291
MDC IDC MSMT BATTERY IMPEDANCE: 614 Ohm
MDC IDC MSMT BATTERY VOLTAGE: 2.79 V
MDC IDC MSMT LEADCHNL RA IMPEDANCE VALUE: 382 Ohm
MDC IDC MSMT LEADCHNL RA PACING THRESHOLD AMPLITUDE: 0.625 V
MDC IDC MSMT LEADCHNL RV PACING THRESHOLD PULSEWIDTH: 0.4 ms
MDC IDC PG IMPLANT DT: 20110608
MDC IDC SESS DTM: 20181016200122
MDC IDC SET LEADCHNL RV PACING AMPLITUDE: 2.5 V
MDC IDC SET LEADCHNL RV PACING PULSEWIDTH: 0.4 ms

## 2017-09-06 ENCOUNTER — Encounter: Payer: Self-pay | Admitting: Cardiology

## 2017-09-06 ENCOUNTER — Other Ambulatory Visit: Payer: Self-pay | Admitting: *Deleted

## 2017-09-06 ENCOUNTER — Ambulatory Visit: Payer: Medicare Other | Admitting: Family

## 2017-09-06 DIAGNOSIS — C649 Malignant neoplasm of unspecified kidney, except renal pelvis: Secondary | ICD-10-CM

## 2017-09-06 DIAGNOSIS — C7802 Secondary malignant neoplasm of left lung: Principal | ICD-10-CM

## 2017-09-07 DIAGNOSIS — I4891 Unspecified atrial fibrillation: Secondary | ICD-10-CM | POA: Diagnosis not present

## 2017-09-07 DIAGNOSIS — C7931 Secondary malignant neoplasm of brain: Secondary | ICD-10-CM | POA: Diagnosis not present

## 2017-09-07 DIAGNOSIS — I495 Sick sinus syndrome: Secondary | ICD-10-CM | POA: Diagnosis not present

## 2017-09-07 DIAGNOSIS — N390 Urinary tract infection, site not specified: Secondary | ICD-10-CM | POA: Diagnosis not present

## 2017-09-07 DIAGNOSIS — M109 Gout, unspecified: Secondary | ICD-10-CM | POA: Diagnosis not present

## 2017-09-07 DIAGNOSIS — H04123 Dry eye syndrome of bilateral lacrimal glands: Secondary | ICD-10-CM | POA: Diagnosis not present

## 2017-09-07 DIAGNOSIS — N183 Chronic kidney disease, stage 3 (moderate): Secondary | ICD-10-CM | POA: Diagnosis not present

## 2017-09-07 DIAGNOSIS — E46 Unspecified protein-calorie malnutrition: Secondary | ICD-10-CM | POA: Diagnosis not present

## 2017-09-07 DIAGNOSIS — I10 Essential (primary) hypertension: Secondary | ICD-10-CM | POA: Diagnosis not present

## 2017-09-07 DIAGNOSIS — C649 Malignant neoplasm of unspecified kidney, except renal pelvis: Secondary | ICD-10-CM | POA: Diagnosis not present

## 2017-09-07 DIAGNOSIS — I25119 Atherosclerotic heart disease of native coronary artery with unspecified angina pectoris: Secondary | ICD-10-CM | POA: Diagnosis not present

## 2017-09-07 DIAGNOSIS — D630 Anemia in neoplastic disease: Secondary | ICD-10-CM | POA: Diagnosis not present

## 2017-09-07 DIAGNOSIS — E785 Hyperlipidemia, unspecified: Secondary | ICD-10-CM | POA: Diagnosis not present

## 2017-09-07 DIAGNOSIS — C7970 Secondary malignant neoplasm of unspecified adrenal gland: Secondary | ICD-10-CM | POA: Diagnosis not present

## 2017-09-07 DIAGNOSIS — C7951 Secondary malignant neoplasm of bone: Secondary | ICD-10-CM | POA: Diagnosis not present

## 2017-09-07 DIAGNOSIS — K219 Gastro-esophageal reflux disease without esophagitis: Secondary | ICD-10-CM | POA: Diagnosis not present

## 2017-09-07 DIAGNOSIS — C7800 Secondary malignant neoplasm of unspecified lung: Secondary | ICD-10-CM | POA: Diagnosis not present

## 2017-09-07 DIAGNOSIS — M1990 Unspecified osteoarthritis, unspecified site: Secondary | ICD-10-CM | POA: Diagnosis not present

## 2017-09-08 DIAGNOSIS — C7931 Secondary malignant neoplasm of brain: Secondary | ICD-10-CM | POA: Diagnosis not present

## 2017-09-08 DIAGNOSIS — C649 Malignant neoplasm of unspecified kidney, except renal pelvis: Secondary | ICD-10-CM | POA: Diagnosis not present

## 2017-09-08 DIAGNOSIS — C7800 Secondary malignant neoplasm of unspecified lung: Secondary | ICD-10-CM | POA: Diagnosis not present

## 2017-09-08 DIAGNOSIS — C7951 Secondary malignant neoplasm of bone: Secondary | ICD-10-CM | POA: Diagnosis not present

## 2017-09-08 DIAGNOSIS — I25119 Atherosclerotic heart disease of native coronary artery with unspecified angina pectoris: Secondary | ICD-10-CM | POA: Diagnosis not present

## 2017-09-08 DIAGNOSIS — C7970 Secondary malignant neoplasm of unspecified adrenal gland: Secondary | ICD-10-CM | POA: Diagnosis not present

## 2017-09-09 DIAGNOSIS — C7970 Secondary malignant neoplasm of unspecified adrenal gland: Secondary | ICD-10-CM | POA: Diagnosis not present

## 2017-09-09 DIAGNOSIS — C7951 Secondary malignant neoplasm of bone: Secondary | ICD-10-CM | POA: Diagnosis not present

## 2017-09-09 DIAGNOSIS — C7800 Secondary malignant neoplasm of unspecified lung: Secondary | ICD-10-CM | POA: Diagnosis not present

## 2017-09-09 DIAGNOSIS — C7931 Secondary malignant neoplasm of brain: Secondary | ICD-10-CM | POA: Diagnosis not present

## 2017-09-09 DIAGNOSIS — I25119 Atherosclerotic heart disease of native coronary artery with unspecified angina pectoris: Secondary | ICD-10-CM | POA: Diagnosis not present

## 2017-09-09 DIAGNOSIS — C649 Malignant neoplasm of unspecified kidney, except renal pelvis: Secondary | ICD-10-CM | POA: Diagnosis not present

## 2017-09-13 DIAGNOSIS — I25119 Atherosclerotic heart disease of native coronary artery with unspecified angina pectoris: Secondary | ICD-10-CM | POA: Diagnosis not present

## 2017-09-13 DIAGNOSIS — C7800 Secondary malignant neoplasm of unspecified lung: Secondary | ICD-10-CM | POA: Diagnosis not present

## 2017-09-13 DIAGNOSIS — C7931 Secondary malignant neoplasm of brain: Secondary | ICD-10-CM | POA: Diagnosis not present

## 2017-09-13 DIAGNOSIS — C7951 Secondary malignant neoplasm of bone: Secondary | ICD-10-CM | POA: Diagnosis not present

## 2017-09-13 DIAGNOSIS — C7970 Secondary malignant neoplasm of unspecified adrenal gland: Secondary | ICD-10-CM | POA: Diagnosis not present

## 2017-09-13 DIAGNOSIS — C649 Malignant neoplasm of unspecified kidney, except renal pelvis: Secondary | ICD-10-CM | POA: Diagnosis not present

## 2017-09-19 DIAGNOSIS — C7800 Secondary malignant neoplasm of unspecified lung: Secondary | ICD-10-CM | POA: Diagnosis not present

## 2017-09-19 DIAGNOSIS — M109 Gout, unspecified: Secondary | ICD-10-CM | POA: Diagnosis not present

## 2017-09-19 DIAGNOSIS — C649 Malignant neoplasm of unspecified kidney, except renal pelvis: Secondary | ICD-10-CM | POA: Diagnosis not present

## 2017-09-19 DIAGNOSIS — I4891 Unspecified atrial fibrillation: Secondary | ICD-10-CM | POA: Diagnosis not present

## 2017-09-19 DIAGNOSIS — E46 Unspecified protein-calorie malnutrition: Secondary | ICD-10-CM | POA: Diagnosis not present

## 2017-09-19 DIAGNOSIS — N390 Urinary tract infection, site not specified: Secondary | ICD-10-CM | POA: Diagnosis not present

## 2017-09-19 DIAGNOSIS — I495 Sick sinus syndrome: Secondary | ICD-10-CM | POA: Diagnosis not present

## 2017-09-19 DIAGNOSIS — D630 Anemia in neoplastic disease: Secondary | ICD-10-CM | POA: Diagnosis not present

## 2017-09-19 DIAGNOSIS — N183 Chronic kidney disease, stage 3 (moderate): Secondary | ICD-10-CM | POA: Diagnosis not present

## 2017-09-19 DIAGNOSIS — K219 Gastro-esophageal reflux disease without esophagitis: Secondary | ICD-10-CM | POA: Diagnosis not present

## 2017-09-19 DIAGNOSIS — C7970 Secondary malignant neoplasm of unspecified adrenal gland: Secondary | ICD-10-CM | POA: Diagnosis not present

## 2017-09-19 DIAGNOSIS — H04123 Dry eye syndrome of bilateral lacrimal glands: Secondary | ICD-10-CM | POA: Diagnosis not present

## 2017-09-19 DIAGNOSIS — E785 Hyperlipidemia, unspecified: Secondary | ICD-10-CM | POA: Diagnosis not present

## 2017-09-19 DIAGNOSIS — I25119 Atherosclerotic heart disease of native coronary artery with unspecified angina pectoris: Secondary | ICD-10-CM | POA: Diagnosis not present

## 2017-09-19 DIAGNOSIS — C7951 Secondary malignant neoplasm of bone: Secondary | ICD-10-CM | POA: Diagnosis not present

## 2017-09-19 DIAGNOSIS — I10 Essential (primary) hypertension: Secondary | ICD-10-CM | POA: Diagnosis not present

## 2017-09-19 DIAGNOSIS — C7931 Secondary malignant neoplasm of brain: Secondary | ICD-10-CM | POA: Diagnosis not present

## 2017-09-19 DIAGNOSIS — M1990 Unspecified osteoarthritis, unspecified site: Secondary | ICD-10-CM | POA: Diagnosis not present

## 2017-09-25 ENCOUNTER — Telehealth: Payer: Self-pay | Admitting: *Deleted

## 2017-09-25 DIAGNOSIS — C7931 Secondary malignant neoplasm of brain: Secondary | ICD-10-CM | POA: Diagnosis not present

## 2017-09-25 DIAGNOSIS — C7800 Secondary malignant neoplasm of unspecified lung: Secondary | ICD-10-CM | POA: Diagnosis not present

## 2017-09-25 DIAGNOSIS — C7951 Secondary malignant neoplasm of bone: Secondary | ICD-10-CM | POA: Diagnosis not present

## 2017-09-25 DIAGNOSIS — C7970 Secondary malignant neoplasm of unspecified adrenal gland: Secondary | ICD-10-CM | POA: Diagnosis not present

## 2017-09-25 DIAGNOSIS — I25119 Atherosclerotic heart disease of native coronary artery with unspecified angina pectoris: Secondary | ICD-10-CM | POA: Diagnosis not present

## 2017-09-25 DIAGNOSIS — C649 Malignant neoplasm of unspecified kidney, except renal pelvis: Secondary | ICD-10-CM | POA: Diagnosis not present

## 2017-09-25 NOTE — Telephone Encounter (Signed)
Hospice RN Ria Comment, notifying the office that patient is declining. She has been placed on the [facility] admission waiting list per family request. Patient hasn't eaten in the last week. They will also stop Eliquis.   Dr Marin Olp notified of above.

## 2017-09-26 DIAGNOSIS — C7931 Secondary malignant neoplasm of brain: Secondary | ICD-10-CM | POA: Diagnosis not present

## 2017-09-26 DIAGNOSIS — C7951 Secondary malignant neoplasm of bone: Secondary | ICD-10-CM | POA: Diagnosis not present

## 2017-09-26 DIAGNOSIS — C649 Malignant neoplasm of unspecified kidney, except renal pelvis: Secondary | ICD-10-CM | POA: Diagnosis not present

## 2017-09-26 DIAGNOSIS — C7800 Secondary malignant neoplasm of unspecified lung: Secondary | ICD-10-CM | POA: Diagnosis not present

## 2017-09-26 DIAGNOSIS — I25119 Atherosclerotic heart disease of native coronary artery with unspecified angina pectoris: Secondary | ICD-10-CM | POA: Diagnosis not present

## 2017-09-26 DIAGNOSIS — C7970 Secondary malignant neoplasm of unspecified adrenal gland: Secondary | ICD-10-CM | POA: Diagnosis not present

## 2017-09-27 DIAGNOSIS — C7951 Secondary malignant neoplasm of bone: Secondary | ICD-10-CM | POA: Diagnosis not present

## 2017-09-27 DIAGNOSIS — C7970 Secondary malignant neoplasm of unspecified adrenal gland: Secondary | ICD-10-CM | POA: Diagnosis not present

## 2017-09-27 DIAGNOSIS — C649 Malignant neoplasm of unspecified kidney, except renal pelvis: Secondary | ICD-10-CM | POA: Diagnosis not present

## 2017-09-27 DIAGNOSIS — I25119 Atherosclerotic heart disease of native coronary artery with unspecified angina pectoris: Secondary | ICD-10-CM | POA: Diagnosis not present

## 2017-09-27 DIAGNOSIS — C7931 Secondary malignant neoplasm of brain: Secondary | ICD-10-CM | POA: Diagnosis not present

## 2017-09-27 DIAGNOSIS — C7800 Secondary malignant neoplasm of unspecified lung: Secondary | ICD-10-CM | POA: Diagnosis not present

## 2017-09-28 DIAGNOSIS — C7800 Secondary malignant neoplasm of unspecified lung: Secondary | ICD-10-CM | POA: Diagnosis not present

## 2017-09-28 DIAGNOSIS — C649 Malignant neoplasm of unspecified kidney, except renal pelvis: Secondary | ICD-10-CM | POA: Diagnosis not present

## 2017-09-28 DIAGNOSIS — C7951 Secondary malignant neoplasm of bone: Secondary | ICD-10-CM | POA: Diagnosis not present

## 2017-09-28 DIAGNOSIS — C7931 Secondary malignant neoplasm of brain: Secondary | ICD-10-CM | POA: Diagnosis not present

## 2017-09-28 DIAGNOSIS — C7970 Secondary malignant neoplasm of unspecified adrenal gland: Secondary | ICD-10-CM | POA: Diagnosis not present

## 2017-09-28 DIAGNOSIS — I25119 Atherosclerotic heart disease of native coronary artery with unspecified angina pectoris: Secondary | ICD-10-CM | POA: Diagnosis not present

## 2017-09-29 DIAGNOSIS — C7951 Secondary malignant neoplasm of bone: Secondary | ICD-10-CM | POA: Diagnosis not present

## 2017-09-29 DIAGNOSIS — C7800 Secondary malignant neoplasm of unspecified lung: Secondary | ICD-10-CM | POA: Diagnosis not present

## 2017-09-29 DIAGNOSIS — C649 Malignant neoplasm of unspecified kidney, except renal pelvis: Secondary | ICD-10-CM | POA: Diagnosis not present

## 2017-09-29 DIAGNOSIS — I25119 Atherosclerotic heart disease of native coronary artery with unspecified angina pectoris: Secondary | ICD-10-CM | POA: Diagnosis not present

## 2017-09-29 DIAGNOSIS — C7970 Secondary malignant neoplasm of unspecified adrenal gland: Secondary | ICD-10-CM | POA: Diagnosis not present

## 2017-09-29 DIAGNOSIS — C7931 Secondary malignant neoplasm of brain: Secondary | ICD-10-CM | POA: Diagnosis not present

## 2017-09-30 DIAGNOSIS — I25119 Atherosclerotic heart disease of native coronary artery with unspecified angina pectoris: Secondary | ICD-10-CM | POA: Diagnosis not present

## 2017-09-30 DIAGNOSIS — C7931 Secondary malignant neoplasm of brain: Secondary | ICD-10-CM | POA: Diagnosis not present

## 2017-09-30 DIAGNOSIS — C7951 Secondary malignant neoplasm of bone: Secondary | ICD-10-CM | POA: Diagnosis not present

## 2017-09-30 DIAGNOSIS — C7970 Secondary malignant neoplasm of unspecified adrenal gland: Secondary | ICD-10-CM | POA: Diagnosis not present

## 2017-09-30 DIAGNOSIS — C649 Malignant neoplasm of unspecified kidney, except renal pelvis: Secondary | ICD-10-CM | POA: Diagnosis not present

## 2017-09-30 DIAGNOSIS — C7800 Secondary malignant neoplasm of unspecified lung: Secondary | ICD-10-CM | POA: Diagnosis not present

## 2017-10-01 DIAGNOSIS — C7970 Secondary malignant neoplasm of unspecified adrenal gland: Secondary | ICD-10-CM | POA: Diagnosis not present

## 2017-10-01 DIAGNOSIS — I25119 Atherosclerotic heart disease of native coronary artery with unspecified angina pectoris: Secondary | ICD-10-CM | POA: Diagnosis not present

## 2017-10-01 DIAGNOSIS — C7931 Secondary malignant neoplasm of brain: Secondary | ICD-10-CM | POA: Diagnosis not present

## 2017-10-01 DIAGNOSIS — C7800 Secondary malignant neoplasm of unspecified lung: Secondary | ICD-10-CM | POA: Diagnosis not present

## 2017-10-01 DIAGNOSIS — C649 Malignant neoplasm of unspecified kidney, except renal pelvis: Secondary | ICD-10-CM | POA: Diagnosis not present

## 2017-10-01 DIAGNOSIS — C7951 Secondary malignant neoplasm of bone: Secondary | ICD-10-CM | POA: Diagnosis not present

## 2017-10-02 DIAGNOSIS — C7931 Secondary malignant neoplasm of brain: Secondary | ICD-10-CM | POA: Diagnosis not present

## 2017-10-02 DIAGNOSIS — C7970 Secondary malignant neoplasm of unspecified adrenal gland: Secondary | ICD-10-CM | POA: Diagnosis not present

## 2017-10-02 DIAGNOSIS — C649 Malignant neoplasm of unspecified kidney, except renal pelvis: Secondary | ICD-10-CM | POA: Diagnosis not present

## 2017-10-02 DIAGNOSIS — C7800 Secondary malignant neoplasm of unspecified lung: Secondary | ICD-10-CM | POA: Diagnosis not present

## 2017-10-02 DIAGNOSIS — I25119 Atherosclerotic heart disease of native coronary artery with unspecified angina pectoris: Secondary | ICD-10-CM | POA: Diagnosis not present

## 2017-10-02 DIAGNOSIS — C7951 Secondary malignant neoplasm of bone: Secondary | ICD-10-CM | POA: Diagnosis not present

## 2017-10-03 DIAGNOSIS — C7800 Secondary malignant neoplasm of unspecified lung: Secondary | ICD-10-CM | POA: Diagnosis not present

## 2017-10-03 DIAGNOSIS — C7931 Secondary malignant neoplasm of brain: Secondary | ICD-10-CM | POA: Diagnosis not present

## 2017-10-03 DIAGNOSIS — C649 Malignant neoplasm of unspecified kidney, except renal pelvis: Secondary | ICD-10-CM | POA: Diagnosis not present

## 2017-10-03 DIAGNOSIS — C7951 Secondary malignant neoplasm of bone: Secondary | ICD-10-CM | POA: Diagnosis not present

## 2017-10-03 DIAGNOSIS — I25119 Atherosclerotic heart disease of native coronary artery with unspecified angina pectoris: Secondary | ICD-10-CM | POA: Diagnosis not present

## 2017-10-03 DIAGNOSIS — C7970 Secondary malignant neoplasm of unspecified adrenal gland: Secondary | ICD-10-CM | POA: Diagnosis not present

## 2017-10-04 ENCOUNTER — Telehealth: Payer: Self-pay | Admitting: Cardiology

## 2017-10-04 DIAGNOSIS — C7951 Secondary malignant neoplasm of bone: Secondary | ICD-10-CM | POA: Diagnosis not present

## 2017-10-04 DIAGNOSIS — C7931 Secondary malignant neoplasm of brain: Secondary | ICD-10-CM | POA: Diagnosis not present

## 2017-10-04 DIAGNOSIS — I25119 Atherosclerotic heart disease of native coronary artery with unspecified angina pectoris: Secondary | ICD-10-CM | POA: Diagnosis not present

## 2017-10-04 DIAGNOSIS — C649 Malignant neoplasm of unspecified kidney, except renal pelvis: Secondary | ICD-10-CM | POA: Diagnosis not present

## 2017-10-04 DIAGNOSIS — C7800 Secondary malignant neoplasm of unspecified lung: Secondary | ICD-10-CM | POA: Diagnosis not present

## 2017-10-04 DIAGNOSIS — C7970 Secondary malignant neoplasm of unspecified adrenal gland: Secondary | ICD-10-CM | POA: Diagnosis not present

## 2017-10-04 NOTE — Telephone Encounter (Signed)
Dr.Jordan was made aware.

## 2017-10-04 NOTE — Telephone Encounter (Signed)
Daughter wanted Dr Martinique to know that pt is in Hospice and will no longer be coming here.Thank him for everything.

## 2017-10-05 DIAGNOSIS — C7951 Secondary malignant neoplasm of bone: Secondary | ICD-10-CM | POA: Diagnosis not present

## 2017-10-05 DIAGNOSIS — I25119 Atherosclerotic heart disease of native coronary artery with unspecified angina pectoris: Secondary | ICD-10-CM | POA: Diagnosis not present

## 2017-10-05 DIAGNOSIS — C7931 Secondary malignant neoplasm of brain: Secondary | ICD-10-CM | POA: Diagnosis not present

## 2017-10-05 DIAGNOSIS — C649 Malignant neoplasm of unspecified kidney, except renal pelvis: Secondary | ICD-10-CM | POA: Diagnosis not present

## 2017-10-05 DIAGNOSIS — C7800 Secondary malignant neoplasm of unspecified lung: Secondary | ICD-10-CM | POA: Diagnosis not present

## 2017-10-05 DIAGNOSIS — C7970 Secondary malignant neoplasm of unspecified adrenal gland: Secondary | ICD-10-CM | POA: Diagnosis not present

## 2017-10-06 DIAGNOSIS — C7800 Secondary malignant neoplasm of unspecified lung: Secondary | ICD-10-CM | POA: Diagnosis not present

## 2017-10-06 DIAGNOSIS — C649 Malignant neoplasm of unspecified kidney, except renal pelvis: Secondary | ICD-10-CM | POA: Diagnosis not present

## 2017-10-06 DIAGNOSIS — C7931 Secondary malignant neoplasm of brain: Secondary | ICD-10-CM | POA: Diagnosis not present

## 2017-10-06 DIAGNOSIS — C7951 Secondary malignant neoplasm of bone: Secondary | ICD-10-CM | POA: Diagnosis not present

## 2017-10-06 DIAGNOSIS — I25119 Atherosclerotic heart disease of native coronary artery with unspecified angina pectoris: Secondary | ICD-10-CM | POA: Diagnosis not present

## 2017-10-06 DIAGNOSIS — C7970 Secondary malignant neoplasm of unspecified adrenal gland: Secondary | ICD-10-CM | POA: Diagnosis not present

## 2017-10-07 DIAGNOSIS — C7800 Secondary malignant neoplasm of unspecified lung: Secondary | ICD-10-CM | POA: Diagnosis not present

## 2017-10-07 DIAGNOSIS — C7931 Secondary malignant neoplasm of brain: Secondary | ICD-10-CM | POA: Diagnosis not present

## 2017-10-07 DIAGNOSIS — C649 Malignant neoplasm of unspecified kidney, except renal pelvis: Secondary | ICD-10-CM | POA: Diagnosis not present

## 2017-10-07 DIAGNOSIS — C7970 Secondary malignant neoplasm of unspecified adrenal gland: Secondary | ICD-10-CM | POA: Diagnosis not present

## 2017-10-07 DIAGNOSIS — C7951 Secondary malignant neoplasm of bone: Secondary | ICD-10-CM | POA: Diagnosis not present

## 2017-10-07 DIAGNOSIS — I25119 Atherosclerotic heart disease of native coronary artery with unspecified angina pectoris: Secondary | ICD-10-CM | POA: Diagnosis not present

## 2017-10-08 DIAGNOSIS — C649 Malignant neoplasm of unspecified kidney, except renal pelvis: Secondary | ICD-10-CM | POA: Diagnosis not present

## 2017-10-08 DIAGNOSIS — C7800 Secondary malignant neoplasm of unspecified lung: Secondary | ICD-10-CM | POA: Diagnosis not present

## 2017-10-08 DIAGNOSIS — C7970 Secondary malignant neoplasm of unspecified adrenal gland: Secondary | ICD-10-CM | POA: Diagnosis not present

## 2017-10-08 DIAGNOSIS — C7931 Secondary malignant neoplasm of brain: Secondary | ICD-10-CM | POA: Diagnosis not present

## 2017-10-08 DIAGNOSIS — C7951 Secondary malignant neoplasm of bone: Secondary | ICD-10-CM | POA: Diagnosis not present

## 2017-10-08 DIAGNOSIS — I25119 Atherosclerotic heart disease of native coronary artery with unspecified angina pectoris: Secondary | ICD-10-CM | POA: Diagnosis not present

## 2017-10-09 DIAGNOSIS — C649 Malignant neoplasm of unspecified kidney, except renal pelvis: Secondary | ICD-10-CM | POA: Diagnosis not present

## 2017-10-09 DIAGNOSIS — C7800 Secondary malignant neoplasm of unspecified lung: Secondary | ICD-10-CM | POA: Diagnosis not present

## 2017-10-09 DIAGNOSIS — C7970 Secondary malignant neoplasm of unspecified adrenal gland: Secondary | ICD-10-CM | POA: Diagnosis not present

## 2017-10-09 DIAGNOSIS — C7951 Secondary malignant neoplasm of bone: Secondary | ICD-10-CM | POA: Diagnosis not present

## 2017-10-09 DIAGNOSIS — I25119 Atherosclerotic heart disease of native coronary artery with unspecified angina pectoris: Secondary | ICD-10-CM | POA: Diagnosis not present

## 2017-10-09 DIAGNOSIS — C7931 Secondary malignant neoplasm of brain: Secondary | ICD-10-CM | POA: Diagnosis not present

## 2017-10-10 DIAGNOSIS — I25119 Atherosclerotic heart disease of native coronary artery with unspecified angina pectoris: Secondary | ICD-10-CM | POA: Diagnosis not present

## 2017-10-10 DIAGNOSIS — C649 Malignant neoplasm of unspecified kidney, except renal pelvis: Secondary | ICD-10-CM | POA: Diagnosis not present

## 2017-10-10 DIAGNOSIS — C7951 Secondary malignant neoplasm of bone: Secondary | ICD-10-CM | POA: Diagnosis not present

## 2017-10-10 DIAGNOSIS — C7800 Secondary malignant neoplasm of unspecified lung: Secondary | ICD-10-CM | POA: Diagnosis not present

## 2017-10-10 DIAGNOSIS — C7931 Secondary malignant neoplasm of brain: Secondary | ICD-10-CM | POA: Diagnosis not present

## 2017-10-10 DIAGNOSIS — C7970 Secondary malignant neoplasm of unspecified adrenal gland: Secondary | ICD-10-CM | POA: Diagnosis not present

## 2017-10-11 DIAGNOSIS — C7800 Secondary malignant neoplasm of unspecified lung: Secondary | ICD-10-CM | POA: Diagnosis not present

## 2017-10-11 DIAGNOSIS — C7951 Secondary malignant neoplasm of bone: Secondary | ICD-10-CM | POA: Diagnosis not present

## 2017-10-11 DIAGNOSIS — C7970 Secondary malignant neoplasm of unspecified adrenal gland: Secondary | ICD-10-CM | POA: Diagnosis not present

## 2017-10-11 DIAGNOSIS — C7931 Secondary malignant neoplasm of brain: Secondary | ICD-10-CM | POA: Diagnosis not present

## 2017-10-11 DIAGNOSIS — I25119 Atherosclerotic heart disease of native coronary artery with unspecified angina pectoris: Secondary | ICD-10-CM | POA: Diagnosis not present

## 2017-10-11 DIAGNOSIS — C649 Malignant neoplasm of unspecified kidney, except renal pelvis: Secondary | ICD-10-CM | POA: Diagnosis not present

## 2017-10-12 DIAGNOSIS — C7931 Secondary malignant neoplasm of brain: Secondary | ICD-10-CM | POA: Diagnosis not present

## 2017-10-12 DIAGNOSIS — I25119 Atherosclerotic heart disease of native coronary artery with unspecified angina pectoris: Secondary | ICD-10-CM | POA: Diagnosis not present

## 2017-10-12 DIAGNOSIS — C7970 Secondary malignant neoplasm of unspecified adrenal gland: Secondary | ICD-10-CM | POA: Diagnosis not present

## 2017-10-12 DIAGNOSIS — C7800 Secondary malignant neoplasm of unspecified lung: Secondary | ICD-10-CM | POA: Diagnosis not present

## 2017-10-12 DIAGNOSIS — C7951 Secondary malignant neoplasm of bone: Secondary | ICD-10-CM | POA: Diagnosis not present

## 2017-10-12 DIAGNOSIS — C649 Malignant neoplasm of unspecified kidney, except renal pelvis: Secondary | ICD-10-CM | POA: Diagnosis not present

## 2017-10-17 ENCOUNTER — Ambulatory Visit: Payer: Medicare Other | Admitting: Cardiology

## 2017-10-19 DEATH — deceased

## 2017-10-30 ENCOUNTER — Encounter: Payer: Self-pay | Admitting: Family Medicine

## 2017-10-30 ENCOUNTER — Encounter: Payer: Self-pay | Admitting: Radiation Therapy

## 2017-11-08 ENCOUNTER — Other Ambulatory Visit: Payer: Self-pay | Admitting: Nurse Practitioner

## 2017-12-09 IMAGING — CT CT HEAD WO/W CM
3 of 10 series · 15 of 47 positions shown, 17 images · IV contrast (APPLIED)
Comparison: CT HEAD October 06, 2016

CLINICAL DATA: Confusion.  History of metastatic renal carcinoma.

EXAM:
CT HEAD WITHOUT AND WITH CONTRAST
TECHNIQUE: Contiguous axial images were obtained from the base of the skull
through the vertex without and with intravenous contrast
CONTRAST:  80mL GLUB0L-MUU IOPAMIDOL (GLUB0L-MUU) INJECTION 61%

[Series 4: cap with 2 · axial · 0.86mm/px · z∈[-845,-285]mm · 8 of 144 slices shown, 10 images]
[im 16/144  brain]
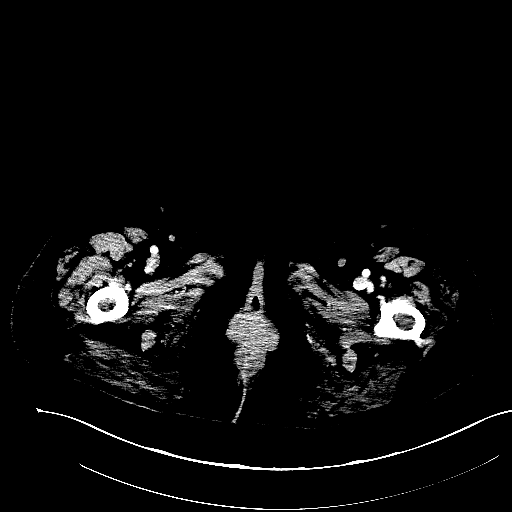
[im 16/144  bone]
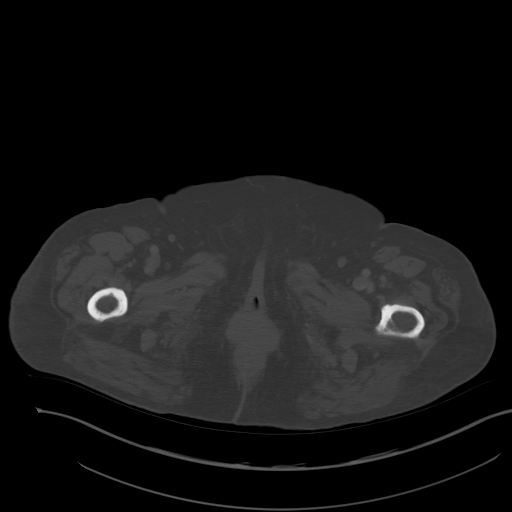
[im 32/144  brain]
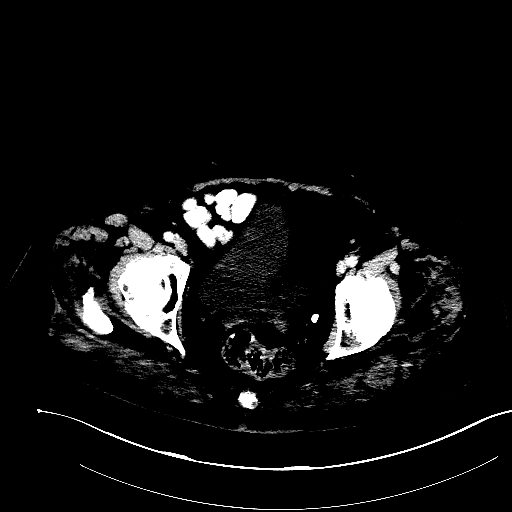
[im 48/144  brain]
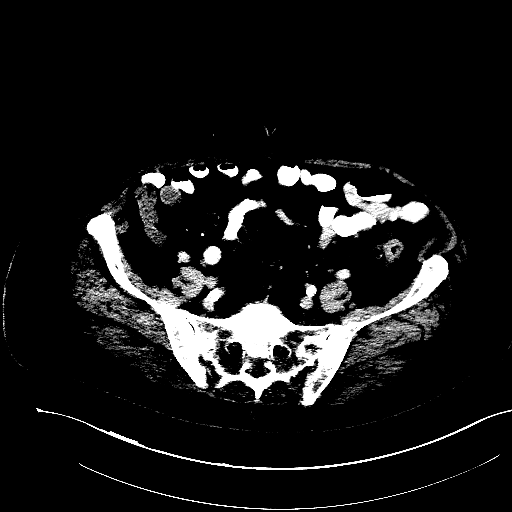
[im 64/144  brain]
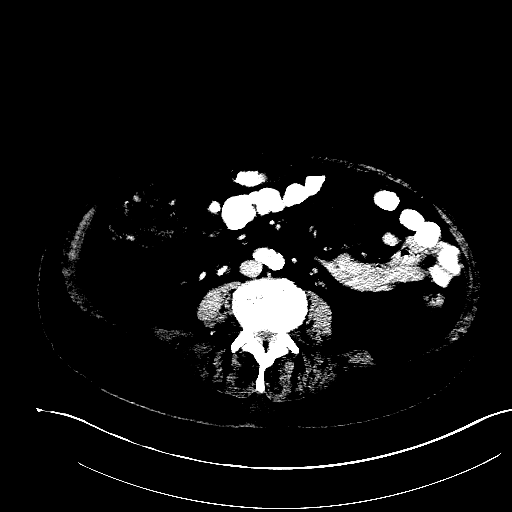
[im 80/144  brain]
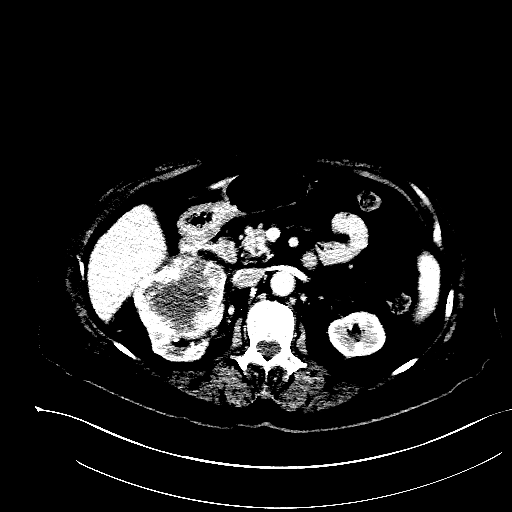
[im 80/144  bone]
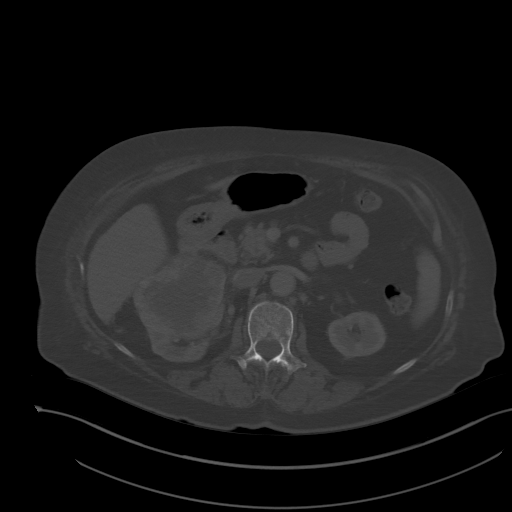
[im 96/144  brain]
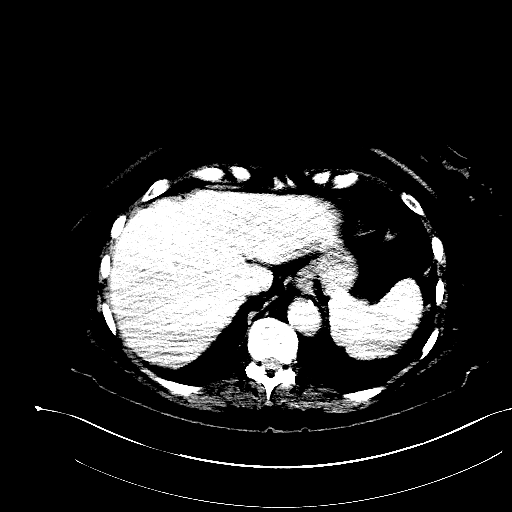
[im 112/144  brain]
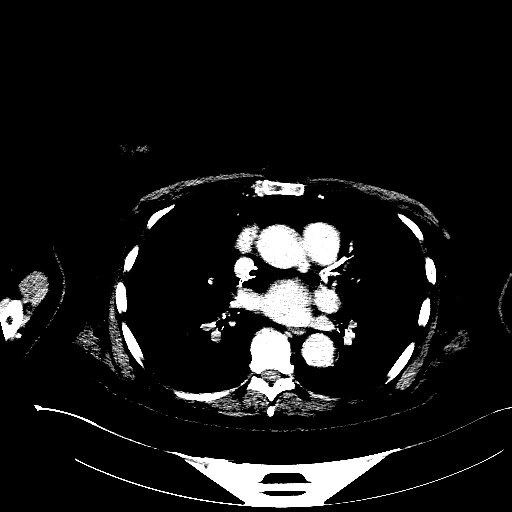
[im 128/144  brain]
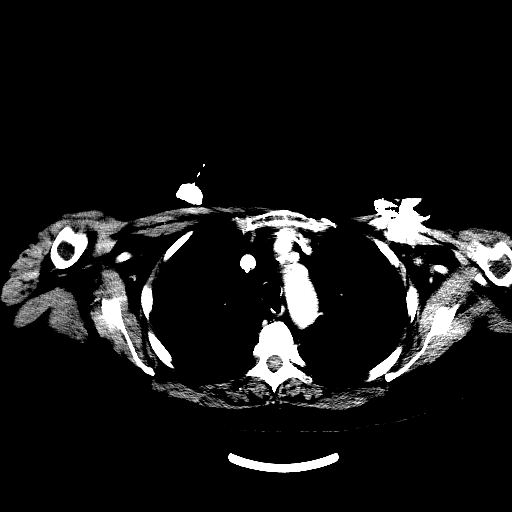

[Series 5: lung · axial · 0.64mm/px · z∈[-453,-361]mm · 4 of 140 slices shown]
[im 16/140  bone]
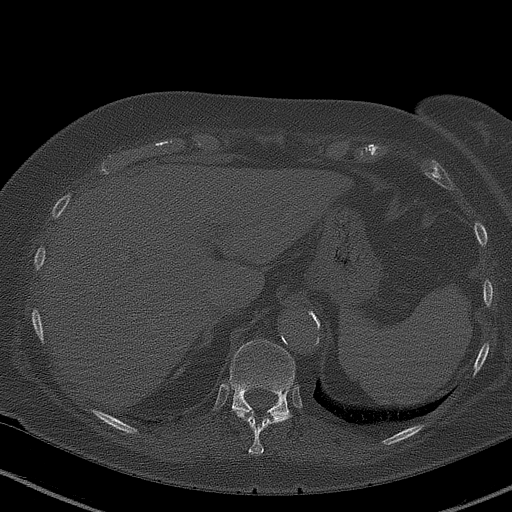
[im 31/140  bone]
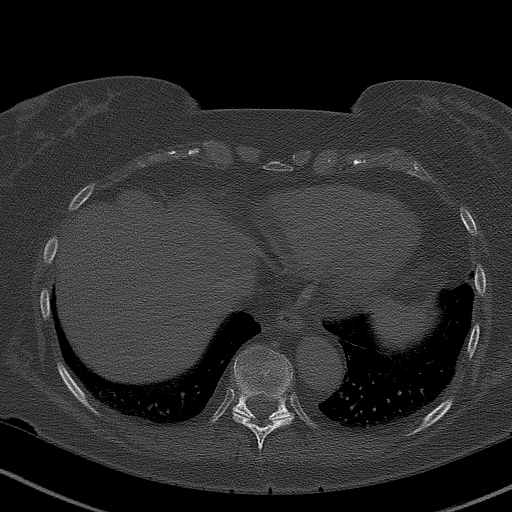
[im 47/140  bone]
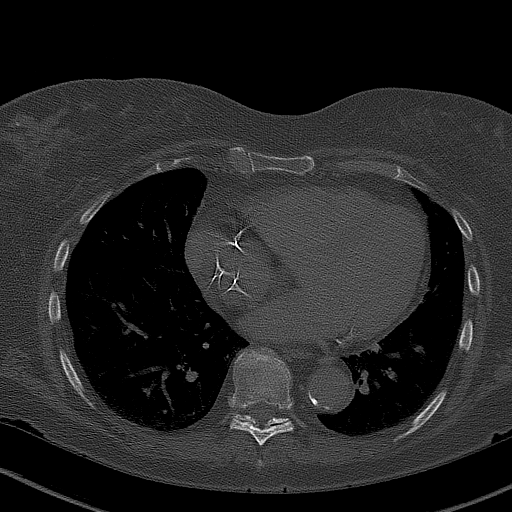
[im 62/140  bone]
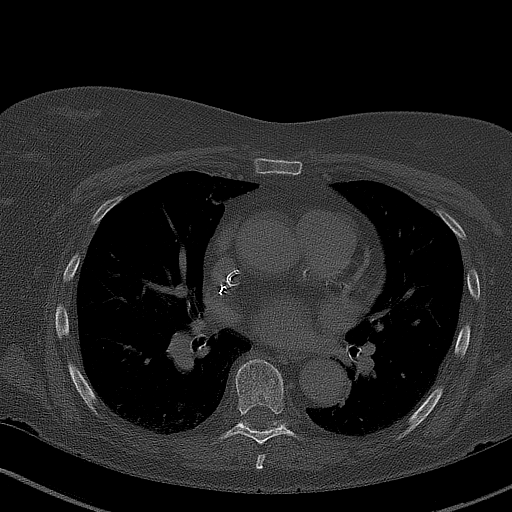

[Series 6: coronals · coronal · 0.96mm/px · 3 of 138 slices shown]
[im 46/138  brain]
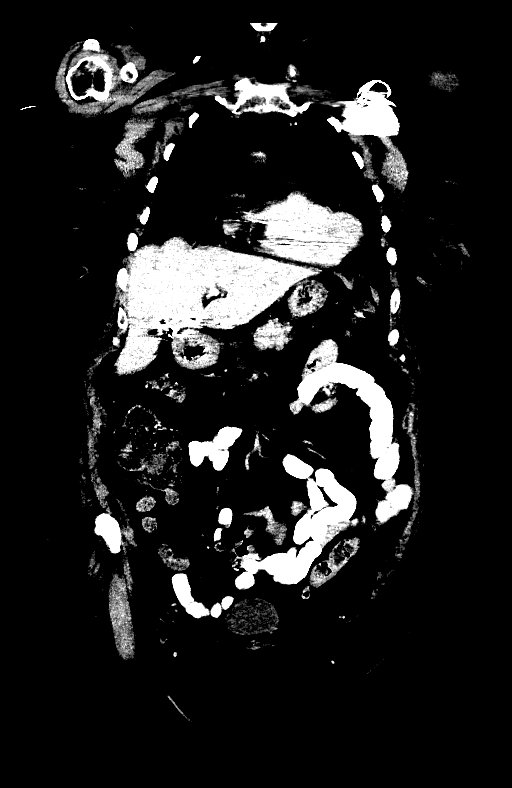
[im 69/138  brain]
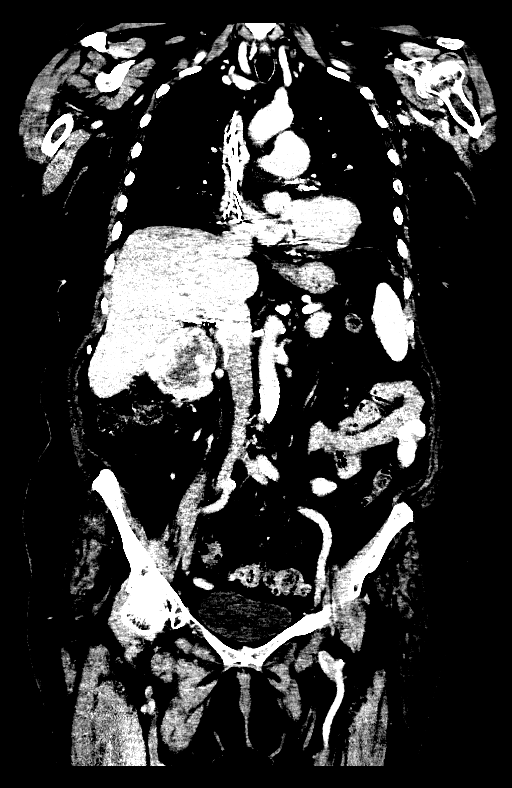
[im 92/138  brain]
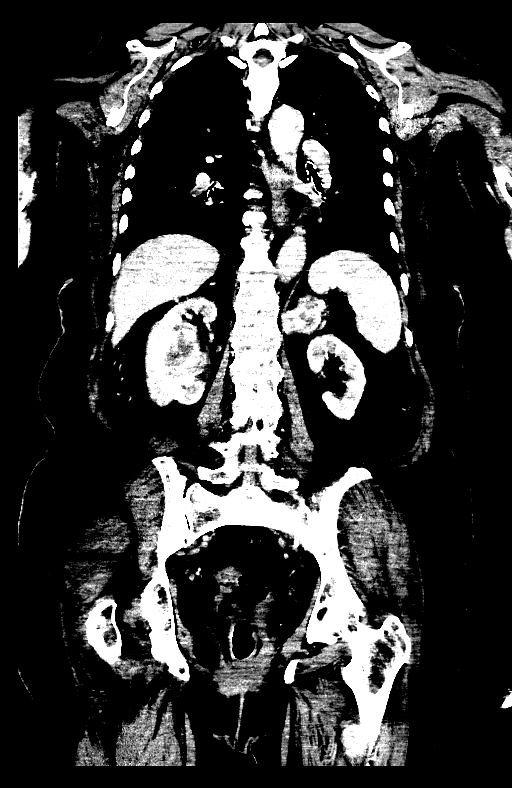

[15 of 47 positions shown; findings below may reference images not displayed]

FINDINGS: BRAIN: Enlarging RIGHT frontal 3.1 x 3.9 x 2.9 cm intraparenchymal
metastasis, thick-walled marginal enhancement. Worsening vasogenic
edema, mass effect on the RIGHT frontal ventricle without
hydrocephalus or entrapment. 4 mm increased RIGHT to LEFT midline
shift. No additional foci of abnormal enhancement. No
intraparenchymal hemorrhage or acute large vascular territory
infarcts. Basal cisterns are patent. No abnormal extra-axial fluid
collections or abnormal extra-axial enhancement/masses.

VASCULAR: Moderate calcific atherosclerosis of the carotid siphons.

SKULL: No skull fracture. No destructive bony lesions. Severe
bilateral temporomandibular osteoarthrosis. No significant scalp
soft tissue swelling.

SINUSES/ORBITS: The mastoid air-cells and included paranasal sinuses
are well-aerated.The included ocular globes and orbital contents are
non-suspicious.

OTHER: None.
IMPRESSION: Solitary enlarging RIGHT frontal lobe 3.1 x 3.9 x 2.7 cm metastasis
with worsening edema resulting in 4 mm RIGHT to LEFT midline shift.

These results will be called to the ordering clinician or
representative by the Radiologist Assistant, and communication
documented in the [facility] Dashboard

## 2022-01-02 NOTE — Progress Notes (Signed)
Closing old encounters.  Pended orders removed.
